# Patient Record
Sex: Male | Born: 1955 | Race: White | Hispanic: No | Marital: Married | Smoking: Never smoker
Health system: Southern US, Community
[De-identification: ages and names within clinical notes are randomized; demographics above are authoritative.]

## PROBLEM LIST (undated history)

## (undated) DIAGNOSIS — G8929 Other chronic pain: Secondary | ICD-10-CM

## (undated) DIAGNOSIS — M549 Dorsalgia, unspecified: Secondary | ICD-10-CM

## (undated) DIAGNOSIS — M542 Cervicalgia: Secondary | ICD-10-CM

## (undated) DIAGNOSIS — M47817 Spondylosis without myelopathy or radiculopathy, lumbosacral region: Secondary | ICD-10-CM

## (undated) DIAGNOSIS — R7302 Impaired glucose tolerance (oral): Secondary | ICD-10-CM

## (undated) DIAGNOSIS — F411 Generalized anxiety disorder: Secondary | ICD-10-CM

## (undated) DIAGNOSIS — K279 Peptic ulcer, site unspecified, unspecified as acute or chronic, without hemorrhage or perforation: Secondary | ICD-10-CM

## (undated) DIAGNOSIS — IMO0001 Reserved for inherently not codable concepts without codable children: Secondary | ICD-10-CM

## (undated) DIAGNOSIS — D530 Protein deficiency anemia: Secondary | ICD-10-CM

## (undated) DIAGNOSIS — D689 Coagulation defect, unspecified: Secondary | ICD-10-CM

## (undated) DIAGNOSIS — I10 Essential (primary) hypertension: Secondary | ICD-10-CM

## (undated) DIAGNOSIS — G4733 Obstructive sleep apnea (adult) (pediatric): Secondary | ICD-10-CM

## (undated) DIAGNOSIS — F329 Major depressive disorder, single episode, unspecified: Secondary | ICD-10-CM

## (undated) DIAGNOSIS — R569 Unspecified convulsions: Secondary | ICD-10-CM

## (undated) DIAGNOSIS — M67919 Unspecified disorder of synovium and tendon, unspecified shoulder: Secondary | ICD-10-CM

## (undated) DIAGNOSIS — Z86718 Personal history of other venous thrombosis and embolism: Secondary | ICD-10-CM

## (undated) DIAGNOSIS — J209 Acute bronchitis, unspecified: Secondary | ICD-10-CM

## (undated) DIAGNOSIS — R21 Rash and other nonspecific skin eruption: Secondary | ICD-10-CM

## (undated) DIAGNOSIS — M5030 Other cervical disc degeneration, unspecified cervical region: Secondary | ICD-10-CM

## (undated) DIAGNOSIS — G4700 Insomnia, unspecified: Secondary | ICD-10-CM

## (undated) DIAGNOSIS — K219 Gastro-esophageal reflux disease without esophagitis: Secondary | ICD-10-CM

## (undated) DIAGNOSIS — M810 Age-related osteoporosis without current pathological fracture: Secondary | ICD-10-CM

## (undated) DIAGNOSIS — M719 Bursopathy, unspecified: Secondary | ICD-10-CM

## (undated) HISTORY — PX: APPENDECTOMY: SHX54

## (undated) HISTORY — DX: Cervicalgia: M54.2

## (undated) HISTORY — DX: Generalized anxiety disorder: F41.1

## (undated) HISTORY — DX: Personal history of other venous thrombosis and embolism: Z86.718

## (undated) HISTORY — DX: Unspecified convulsions: R56.9

## (undated) HISTORY — DX: Reserved for inherently not codable concepts without codable children: IMO0001

## (undated) HISTORY — DX: Insomnia, unspecified: G47.00

## (undated) HISTORY — DX: Peptic ulcer, site unspecified, unspecified as acute or chronic, without hemorrhage or perforation: K27.9

## (undated) HISTORY — DX: Unspecified disorder of synovium and tendon, unspecified shoulder: M67.919

## (undated) HISTORY — DX: Essential (primary) hypertension: I10

## (undated) HISTORY — DX: Rash and other nonspecific skin eruption: R21

## (undated) HISTORY — DX: Bursopathy, unspecified: M71.9

## (undated) HISTORY — DX: Other chronic pain: G89.29

## (undated) HISTORY — DX: Acute bronchitis, unspecified: J20.9

## (undated) HISTORY — DX: Other cervical disc degeneration, unspecified cervical region: M50.30

## (undated) HISTORY — DX: Gastro-esophageal reflux disease without esophagitis: K21.9

## (undated) HISTORY — DX: Spondylosis without myelopathy or radiculopathy, lumbosacral region: M47.817

## (undated) HISTORY — DX: Major depressive disorder, single episode, unspecified: F32.9

## (undated) HISTORY — DX: Coagulation defect, unspecified: D68.9

## (undated) HISTORY — DX: Dorsalgia, unspecified: M54.9

## (undated) HISTORY — DX: Obstructive sleep apnea (adult) (pediatric): G47.33

## (undated) HISTORY — DX: Protein deficiency anemia: D53.0

## (undated) HISTORY — PX: OTHER SURGICAL HISTORY: SHX169

## (undated) HISTORY — DX: Impaired glucose tolerance (oral): R73.02

---

## 2000-12-26 ENCOUNTER — Emergency Department (HOSPITAL_COMMUNITY): Admission: EM | Admit: 2000-12-26 | Discharge: 2000-12-26 | Payer: Self-pay | Admitting: Emergency Medicine

## 2000-12-26 ENCOUNTER — Encounter: Payer: Self-pay | Admitting: Emergency Medicine

## 2000-12-30 ENCOUNTER — Emergency Department (HOSPITAL_COMMUNITY): Admission: EM | Admit: 2000-12-30 | Discharge: 2000-12-30 | Payer: Self-pay

## 2001-01-07 ENCOUNTER — Ambulatory Visit (HOSPITAL_COMMUNITY): Admission: RE | Admit: 2001-01-07 | Discharge: 2001-01-07 | Payer: Self-pay | Admitting: Cardiology

## 2001-03-11 ENCOUNTER — Encounter: Admission: RE | Admit: 2001-03-11 | Discharge: 2001-03-11 | Payer: Self-pay | Admitting: Family Medicine

## 2001-03-11 ENCOUNTER — Encounter: Payer: Self-pay | Admitting: Family Medicine

## 2001-07-24 ENCOUNTER — Encounter: Payer: Self-pay | Admitting: Orthopedic Surgery

## 2001-07-24 ENCOUNTER — Encounter: Admission: RE | Admit: 2001-07-24 | Discharge: 2001-07-24 | Payer: Self-pay | Admitting: Orthopedic Surgery

## 2002-05-20 ENCOUNTER — Ambulatory Visit (HOSPITAL_BASED_OUTPATIENT_CLINIC_OR_DEPARTMENT_OTHER): Admission: RE | Admit: 2002-05-20 | Discharge: 2002-05-20 | Payer: Self-pay | Admitting: Chiropractic Medicine

## 2002-06-16 ENCOUNTER — Ambulatory Visit (HOSPITAL_BASED_OUTPATIENT_CLINIC_OR_DEPARTMENT_OTHER): Admission: RE | Admit: 2002-06-16 | Discharge: 2002-06-16 | Payer: Self-pay | Admitting: Family Medicine

## 2003-02-12 ENCOUNTER — Emergency Department (HOSPITAL_COMMUNITY): Admission: EM | Admit: 2003-02-12 | Discharge: 2003-02-12 | Payer: Self-pay | Admitting: Emergency Medicine

## 2003-03-17 ENCOUNTER — Encounter: Admission: RE | Admit: 2003-03-17 | Discharge: 2003-03-17 | Payer: Self-pay | Admitting: Allergy and Immunology

## 2003-03-23 ENCOUNTER — Ambulatory Visit (HOSPITAL_COMMUNITY): Admission: RE | Admit: 2003-03-23 | Discharge: 2003-03-23 | Payer: Self-pay | Admitting: Allergy and Immunology

## 2003-07-06 ENCOUNTER — Encounter: Admission: RE | Admit: 2003-07-06 | Discharge: 2003-07-06 | Payer: Self-pay | Admitting: Internal Medicine

## 2004-03-25 HISTORY — PX: FACIAL RECONSTRUCTION SURGERY: SHX631

## 2004-04-09 ENCOUNTER — Ambulatory Visit: Payer: Self-pay | Admitting: Internal Medicine

## 2004-04-27 ENCOUNTER — Emergency Department (HOSPITAL_COMMUNITY): Admission: EM | Admit: 2004-04-27 | Discharge: 2004-04-27 | Payer: Self-pay | Admitting: Emergency Medicine

## 2004-05-10 ENCOUNTER — Ambulatory Visit: Payer: Self-pay | Admitting: Internal Medicine

## 2004-06-15 ENCOUNTER — Ambulatory Visit: Payer: Self-pay | Admitting: Internal Medicine

## 2004-09-12 ENCOUNTER — Emergency Department (HOSPITAL_COMMUNITY): Admission: EM | Admit: 2004-09-12 | Discharge: 2004-09-12 | Payer: Self-pay | Admitting: Emergency Medicine

## 2004-09-22 ENCOUNTER — Inpatient Hospital Stay (HOSPITAL_COMMUNITY): Admission: EM | Admit: 2004-09-22 | Discharge: 2004-10-10 | Payer: Self-pay | Admitting: Emergency Medicine

## 2004-10-12 ENCOUNTER — Ambulatory Visit: Payer: Self-pay | Admitting: Endocrinology

## 2004-10-16 ENCOUNTER — Ambulatory Visit: Payer: Self-pay | Admitting: Cardiology

## 2004-10-17 ENCOUNTER — Emergency Department (HOSPITAL_COMMUNITY): Admission: EM | Admit: 2004-10-17 | Discharge: 2004-10-17 | Payer: Self-pay | Admitting: Emergency Medicine

## 2004-10-19 ENCOUNTER — Ambulatory Visit: Payer: Self-pay | Admitting: Cardiology

## 2004-10-25 ENCOUNTER — Ambulatory Visit: Payer: Self-pay | Admitting: Internal Medicine

## 2004-10-31 ENCOUNTER — Ambulatory Visit: Payer: Self-pay | Admitting: Cardiology

## 2004-11-06 ENCOUNTER — Ambulatory Visit: Payer: Self-pay | Admitting: Internal Medicine

## 2004-11-12 ENCOUNTER — Ambulatory Visit: Payer: Self-pay | Admitting: Cardiology

## 2004-11-19 ENCOUNTER — Ambulatory Visit: Payer: Self-pay | Admitting: Internal Medicine

## 2004-11-21 ENCOUNTER — Ambulatory Visit: Payer: Self-pay | Admitting: *Deleted

## 2004-11-30 ENCOUNTER — Ambulatory Visit: Payer: Self-pay | Admitting: Cardiology

## 2004-12-12 ENCOUNTER — Ambulatory Visit: Payer: Self-pay | Admitting: Cardiology

## 2004-12-19 ENCOUNTER — Encounter: Admission: RE | Admit: 2004-12-19 | Discharge: 2004-12-19 | Payer: Self-pay | Admitting: Neurology

## 2004-12-25 ENCOUNTER — Inpatient Hospital Stay (HOSPITAL_COMMUNITY): Admission: EM | Admit: 2004-12-25 | Discharge: 2005-01-01 | Payer: Self-pay | Admitting: Emergency Medicine

## 2004-12-29 ENCOUNTER — Ambulatory Visit: Payer: Self-pay | Admitting: Internal Medicine

## 2005-01-08 ENCOUNTER — Ambulatory Visit: Payer: Self-pay | Admitting: Cardiology

## 2005-01-18 ENCOUNTER — Ambulatory Visit: Payer: Self-pay | Admitting: Internal Medicine

## 2005-01-18 ENCOUNTER — Ambulatory Visit: Payer: Self-pay | Admitting: Cardiology

## 2005-02-04 ENCOUNTER — Inpatient Hospital Stay (HOSPITAL_COMMUNITY): Admission: EM | Admit: 2005-02-04 | Discharge: 2005-02-06 | Payer: Self-pay | Admitting: Emergency Medicine

## 2005-02-04 ENCOUNTER — Ambulatory Visit: Payer: Self-pay | Admitting: Cardiology

## 2005-02-04 ENCOUNTER — Ambulatory Visit: Payer: Self-pay | Admitting: Internal Medicine

## 2005-02-12 ENCOUNTER — Ambulatory Visit: Payer: Self-pay | Admitting: Cardiology

## 2005-02-26 ENCOUNTER — Ambulatory Visit: Payer: Self-pay | Admitting: Internal Medicine

## 2005-02-27 ENCOUNTER — Ambulatory Visit (HOSPITAL_COMMUNITY): Admission: RE | Admit: 2005-02-27 | Discharge: 2005-02-27 | Payer: Self-pay | Admitting: Internal Medicine

## 2005-03-03 ENCOUNTER — Emergency Department (HOSPITAL_COMMUNITY): Admission: EM | Admit: 2005-03-03 | Discharge: 2005-03-03 | Payer: Self-pay | Admitting: Emergency Medicine

## 2005-03-08 ENCOUNTER — Ambulatory Visit: Payer: Self-pay | Admitting: Cardiology

## 2005-04-30 ENCOUNTER — Emergency Department (HOSPITAL_COMMUNITY): Admission: EM | Admit: 2005-04-30 | Discharge: 2005-04-30 | Payer: Self-pay | Admitting: Emergency Medicine

## 2005-08-01 ENCOUNTER — Emergency Department (HOSPITAL_COMMUNITY): Admission: EM | Admit: 2005-08-01 | Discharge: 2005-08-01 | Payer: Self-pay | Admitting: Emergency Medicine

## 2005-08-15 ENCOUNTER — Ambulatory Visit: Payer: Self-pay | Admitting: Internal Medicine

## 2005-08-22 ENCOUNTER — Emergency Department (HOSPITAL_COMMUNITY): Admission: EM | Admit: 2005-08-22 | Discharge: 2005-08-23 | Payer: Self-pay | Admitting: *Deleted

## 2005-09-24 ENCOUNTER — Inpatient Hospital Stay (HOSPITAL_COMMUNITY): Admission: EM | Admit: 2005-09-24 | Discharge: 2005-10-03 | Payer: Self-pay | Admitting: Emergency Medicine

## 2005-09-24 ENCOUNTER — Ambulatory Visit: Payer: Self-pay | Admitting: Internal Medicine

## 2005-09-24 ENCOUNTER — Encounter: Payer: Self-pay | Admitting: Vascular Surgery

## 2005-09-27 ENCOUNTER — Encounter (INDEPENDENT_AMBULATORY_CARE_PROVIDER_SITE_OTHER): Payer: Self-pay | Admitting: *Deleted

## 2005-09-29 ENCOUNTER — Ambulatory Visit: Payer: Self-pay | Admitting: Infectious Diseases

## 2005-10-01 ENCOUNTER — Encounter: Payer: Self-pay | Admitting: Vascular Surgery

## 2005-10-02 ENCOUNTER — Ambulatory Visit: Payer: Self-pay | Admitting: Oncology

## 2005-10-08 ENCOUNTER — Ambulatory Visit: Payer: Self-pay | Admitting: Internal Medicine

## 2005-10-09 ENCOUNTER — Ambulatory Visit: Payer: Self-pay | Admitting: Oncology

## 2005-10-18 ENCOUNTER — Ambulatory Visit: Payer: Self-pay | Admitting: Cardiology

## 2005-10-25 ENCOUNTER — Ambulatory Visit: Payer: Self-pay | Admitting: Internal Medicine

## 2005-11-01 ENCOUNTER — Ambulatory Visit: Payer: Self-pay | Admitting: Cardiology

## 2005-11-01 ENCOUNTER — Ambulatory Visit: Payer: Self-pay | Admitting: Internal Medicine

## 2005-11-22 ENCOUNTER — Ambulatory Visit: Payer: Self-pay

## 2005-11-22 ENCOUNTER — Ambulatory Visit: Payer: Self-pay | Admitting: Cardiology

## 2005-11-29 ENCOUNTER — Ambulatory Visit: Payer: Self-pay | Admitting: Cardiology

## 2005-12-12 ENCOUNTER — Ambulatory Visit: Payer: Self-pay | Admitting: Cardiology

## 2005-12-12 ENCOUNTER — Ambulatory Visit: Payer: Self-pay | Admitting: Internal Medicine

## 2005-12-13 ENCOUNTER — Inpatient Hospital Stay (HOSPITAL_COMMUNITY): Admission: EM | Admit: 2005-12-13 | Discharge: 2005-12-14 | Payer: Self-pay | Admitting: Emergency Medicine

## 2005-12-13 ENCOUNTER — Encounter: Payer: Self-pay | Admitting: Vascular Surgery

## 2005-12-26 ENCOUNTER — Ambulatory Visit: Payer: Self-pay | Admitting: Internal Medicine

## 2005-12-26 ENCOUNTER — Ambulatory Visit: Payer: Self-pay | Admitting: Cardiology

## 2005-12-28 ENCOUNTER — Emergency Department (HOSPITAL_COMMUNITY): Admission: EM | Admit: 2005-12-28 | Discharge: 2005-12-29 | Payer: Self-pay | Admitting: Emergency Medicine

## 2006-01-03 ENCOUNTER — Ambulatory Visit: Payer: Self-pay | Admitting: Internal Medicine

## 2006-01-06 ENCOUNTER — Ambulatory Visit: Payer: Self-pay | Admitting: Cardiology

## 2006-01-13 ENCOUNTER — Ambulatory Visit: Payer: Self-pay | Admitting: Cardiovascular Disease

## 2006-01-23 ENCOUNTER — Ambulatory Visit: Payer: Self-pay | Admitting: *Deleted

## 2006-02-18 ENCOUNTER — Ambulatory Visit: Payer: Self-pay | Admitting: Cardiology

## 2006-03-04 ENCOUNTER — Ambulatory Visit: Payer: Self-pay | Admitting: Cardiovascular Disease

## 2006-03-10 ENCOUNTER — Ambulatory Visit: Payer: Self-pay | Admitting: Cardiology

## 2006-03-26 ENCOUNTER — Ambulatory Visit (HOSPITAL_COMMUNITY): Admission: RE | Admit: 2006-03-26 | Discharge: 2006-03-26 | Payer: Self-pay | Admitting: Internal Medicine

## 2006-03-26 ENCOUNTER — Ambulatory Visit: Payer: Self-pay | Admitting: Internal Medicine

## 2006-03-26 LAB — CONVERTED CEMR LAB
Ketones, ur: NEGATIVE mg/dL
Nitrite: NEGATIVE
Urine Glucose: NEGATIVE mg/dL
pH: 6 (ref 5.0–8.0)

## 2006-03-31 ENCOUNTER — Emergency Department (HOSPITAL_COMMUNITY): Admission: EM | Admit: 2006-03-31 | Discharge: 2006-03-31 | Payer: Self-pay | Admitting: Emergency Medicine

## 2006-04-08 ENCOUNTER — Ambulatory Visit: Payer: Self-pay | Admitting: Cardiology

## 2006-04-18 ENCOUNTER — Ambulatory Visit: Payer: Self-pay | Admitting: Internal Medicine

## 2006-05-01 ENCOUNTER — Ambulatory Visit: Payer: Self-pay | Admitting: Cardiology

## 2006-05-15 ENCOUNTER — Ambulatory Visit: Payer: Self-pay | Admitting: Oncology

## 2006-05-17 ENCOUNTER — Emergency Department (HOSPITAL_COMMUNITY): Admission: EM | Admit: 2006-05-17 | Discharge: 2006-05-17 | Payer: Self-pay | Admitting: Emergency Medicine

## 2006-05-22 ENCOUNTER — Emergency Department (HOSPITAL_COMMUNITY): Admission: EM | Admit: 2006-05-22 | Discharge: 2006-05-22 | Payer: Self-pay | Admitting: Emergency Medicine

## 2006-05-23 ENCOUNTER — Ambulatory Visit: Payer: Self-pay | Admitting: Internal Medicine
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
## 2006-05-26 ENCOUNTER — Ambulatory Visit: Payer: Self-pay | Admitting: Cardiovascular Disease

## 2006-05-29 ENCOUNTER — Ambulatory Visit (HOSPITAL_COMMUNITY): Admission: RE | Admit: 2006-05-29 | Discharge: 2006-05-29 | Payer: Self-pay | Admitting: Gastroenterology

## 2006-06-02 ENCOUNTER — Ambulatory Visit: Payer: Self-pay | Admitting: Cardiology

## 2006-06-06 ENCOUNTER — Ambulatory Visit: Payer: Self-pay | Admitting: Internal Medicine

## 2006-06-06 LAB — CONVERTED CEMR LAB
Albumin: 3.7 g/dL (ref 3.5–5.2)
Amylase: 63 units/L (ref 27–131)
Basophils Absolute: 0 10*3/uL (ref 0.0–0.1)
Bilirubin Urine: NEGATIVE
Creatinine, Ser: 1.2 mg/dL (ref 0.4–1.5)
Eosinophils Absolute: 0.4 10*3/uL (ref 0.0–0.6)
Hemoglobin: 14.4 g/dL (ref 13.0–17.0)
Leukocytes, UA: NEGATIVE
Lipase: 26 units/L (ref 11.0–59.0)
Lymphocytes Relative: 30.5 % (ref 12.0–46.0)
MCHC: 34.1 g/dL (ref 30.0–36.0)
Monocytes Absolute: 0.4 10*3/uL (ref 0.2–0.7)
Monocytes Relative: 8.4 % (ref 3.0–11.0)
Neutro Abs: 2.9 10*3/uL (ref 1.4–7.7)
Nitrite: NEGATIVE
Potassium: 4.2 meq/L (ref 3.5–5.1)
Sodium: 139 meq/L (ref 135–145)
Specific Gravity, Urine: 1.02 (ref 1.000–1.03)
Total Bilirubin: 0.5 mg/dL (ref 0.3–1.2)
Total Protein: 7.4 g/dL (ref 6.0–8.3)
Urine Glucose: NEGATIVE mg/dL
Urobilinogen, UA: 0.2 (ref 0.0–1.0)

## 2006-06-09 ENCOUNTER — Encounter: Payer: Self-pay | Admitting: Internal Medicine

## 2006-06-16 ENCOUNTER — Ambulatory Visit: Payer: Self-pay | Admitting: Cardiology

## 2006-06-17 ENCOUNTER — Ambulatory Visit: Payer: Self-pay | Admitting: Internal Medicine

## 2006-06-23 ENCOUNTER — Ambulatory Visit: Payer: Self-pay | Admitting: Internal Medicine

## 2006-07-03 ENCOUNTER — Ambulatory Visit: Payer: Self-pay | Admitting: Cardiology

## 2006-07-11 ENCOUNTER — Ambulatory Visit (HOSPITAL_COMMUNITY): Admission: RE | Admit: 2006-07-11 | Discharge: 2006-07-11 | Payer: Self-pay | Admitting: Pediatrics

## 2006-07-16 ENCOUNTER — Inpatient Hospital Stay (HOSPITAL_COMMUNITY): Admission: EM | Admit: 2006-07-16 | Discharge: 2006-07-18 | Payer: Self-pay | Admitting: Emergency Medicine

## 2006-07-16 ENCOUNTER — Ambulatory Visit: Payer: Self-pay | Admitting: Internal Medicine

## 2006-07-21 ENCOUNTER — Inpatient Hospital Stay (HOSPITAL_COMMUNITY): Admission: AD | Admit: 2006-07-21 | Discharge: 2006-07-25 | Payer: Self-pay | Admitting: Neurology

## 2006-07-28 ENCOUNTER — Ambulatory Visit: Payer: Self-pay | Admitting: Internal Medicine

## 2006-07-28 ENCOUNTER — Ambulatory Visit: Payer: Self-pay | Admitting: Cardiovascular Disease

## 2006-07-28 LAB — CONVERTED CEMR LAB
Bilirubin Urine: NEGATIVE
CO2: 26 meq/L (ref 19–32)
Glucose, Bld: 117 mg/dL — ABNORMAL HIGH (ref 70–99)
Hemoglobin, Urine: NEGATIVE
Ketones, ur: NEGATIVE mg/dL
Leukocytes, UA: NEGATIVE
Nitrite: NEGATIVE
Phosphorus: 2.1 mg/dL — ABNORMAL LOW (ref 2.3–4.6)
Potassium: 3.8 meq/L (ref 3.5–5.1)
Total Protein, Urine: NEGATIVE mg/dL
Urine Glucose: NEGATIVE mg/dL
Urobilinogen, UA: 0.2 (ref 0.0–1.0)
pH: 6 (ref 5.0–8.0)

## 2006-07-31 ENCOUNTER — Inpatient Hospital Stay (HOSPITAL_COMMUNITY): Admission: EM | Admit: 2006-07-31 | Discharge: 2006-08-02 | Payer: Self-pay | Admitting: Emergency Medicine

## 2006-08-01 ENCOUNTER — Ambulatory Visit: Payer: Self-pay | Admitting: Vascular Surgery

## 2006-08-11 ENCOUNTER — Ambulatory Visit: Payer: Self-pay | Admitting: Internal Medicine

## 2006-08-13 ENCOUNTER — Ambulatory Visit: Payer: Self-pay | Admitting: Internal Medicine

## 2006-08-25 ENCOUNTER — Ambulatory Visit: Payer: Self-pay | Admitting: Cardiovascular Disease

## 2006-09-04 ENCOUNTER — Ambulatory Visit: Payer: Self-pay | Admitting: Internal Medicine

## 2006-09-04 ENCOUNTER — Ambulatory Visit: Payer: Self-pay | Admitting: Cardiology

## 2006-09-09 ENCOUNTER — Ambulatory Visit: Payer: Self-pay | Admitting: Cardiology

## 2006-09-22 ENCOUNTER — Ambulatory Visit: Payer: Self-pay | Admitting: Cardiology

## 2006-09-29 ENCOUNTER — Inpatient Hospital Stay (HOSPITAL_COMMUNITY): Admission: EM | Admit: 2006-09-29 | Discharge: 2006-10-07 | Payer: Self-pay | Admitting: Emergency Medicine

## 2006-09-29 DIAGNOSIS — D530 Protein deficiency anemia: Secondary | ICD-10-CM | POA: Insufficient documentation

## 2006-09-29 DIAGNOSIS — G4733 Obstructive sleep apnea (adult) (pediatric): Secondary | ICD-10-CM | POA: Insufficient documentation

## 2006-09-29 DIAGNOSIS — F411 Generalized anxiety disorder: Secondary | ICD-10-CM

## 2006-09-29 DIAGNOSIS — G8929 Other chronic pain: Secondary | ICD-10-CM

## 2006-09-29 DIAGNOSIS — K219 Gastro-esophageal reflux disease without esophagitis: Secondary | ICD-10-CM

## 2006-09-29 DIAGNOSIS — F329 Major depressive disorder, single episode, unspecified: Secondary | ICD-10-CM

## 2006-09-29 DIAGNOSIS — D689 Coagulation defect, unspecified: Secondary | ICD-10-CM

## 2006-09-29 DIAGNOSIS — F3289 Other specified depressive episodes: Secondary | ICD-10-CM

## 2006-09-29 DIAGNOSIS — G40909 Epilepsy, unspecified, not intractable, without status epilepticus: Secondary | ICD-10-CM | POA: Insufficient documentation

## 2006-09-29 DIAGNOSIS — R569 Unspecified convulsions: Secondary | ICD-10-CM

## 2006-09-29 DIAGNOSIS — I10 Essential (primary) hypertension: Secondary | ICD-10-CM

## 2006-09-29 HISTORY — DX: Other chronic pain: G89.29

## 2006-09-29 HISTORY — DX: Obstructive sleep apnea (adult) (pediatric): G47.33

## 2006-09-29 HISTORY — DX: Essential (primary) hypertension: I10

## 2006-09-29 HISTORY — DX: Protein deficiency anemia: D53.0

## 2006-09-29 HISTORY — DX: Other specified depressive episodes: F32.89

## 2006-09-29 HISTORY — DX: Gastro-esophageal reflux disease without esophagitis: K21.9

## 2006-09-29 HISTORY — DX: Coagulation defect, unspecified: D68.9

## 2006-09-29 HISTORY — DX: Major depressive disorder, single episode, unspecified: F32.9

## 2006-09-29 HISTORY — DX: Unspecified convulsions: R56.9

## 2006-09-29 HISTORY — DX: Generalized anxiety disorder: F41.1

## 2006-09-30 ENCOUNTER — Ambulatory Visit: Payer: Self-pay | Admitting: Internal Medicine

## 2006-10-03 ENCOUNTER — Encounter: Payer: Self-pay | Admitting: Internal Medicine

## 2006-10-07 ENCOUNTER — Ambulatory Visit: Payer: Self-pay | Admitting: Internal Medicine

## 2006-10-16 ENCOUNTER — Ambulatory Visit: Payer: Self-pay | Admitting: Cardiology

## 2006-10-18 ENCOUNTER — Emergency Department (HOSPITAL_COMMUNITY): Admission: EM | Admit: 2006-10-18 | Discharge: 2006-10-18 | Payer: Self-pay | Admitting: Emergency Medicine

## 2006-10-24 ENCOUNTER — Ambulatory Visit: Payer: Self-pay | Admitting: Internal Medicine

## 2006-11-03 ENCOUNTER — Inpatient Hospital Stay (HOSPITAL_COMMUNITY): Admission: EM | Admit: 2006-11-03 | Discharge: 2006-11-07 | Payer: Self-pay | Admitting: Emergency Medicine

## 2006-11-03 ENCOUNTER — Ambulatory Visit: Payer: Self-pay | Admitting: Internal Medicine

## 2006-11-06 ENCOUNTER — Ambulatory Visit: Payer: Self-pay | Admitting: Physical Medicine & Rehabilitation

## 2006-11-13 ENCOUNTER — Ambulatory Visit: Payer: Self-pay | Admitting: Internal Medicine

## 2006-11-14 ENCOUNTER — Ambulatory Visit: Payer: Self-pay | Admitting: Cardiovascular Disease

## 2006-11-25 ENCOUNTER — Ambulatory Visit: Payer: Self-pay | Admitting: Cardiology

## 2006-12-03 ENCOUNTER — Ambulatory Visit: Payer: Self-pay | Admitting: Internal Medicine

## 2006-12-04 ENCOUNTER — Ambulatory Visit: Payer: Self-pay | Admitting: Cardiology

## 2006-12-18 ENCOUNTER — Ambulatory Visit: Payer: Self-pay | Admitting: Cardiology

## 2006-12-30 ENCOUNTER — Encounter
Admission: RE | Admit: 2006-12-30 | Discharge: 2007-03-30 | Payer: Self-pay | Admitting: Physical Medicine & Rehabilitation

## 2006-12-30 ENCOUNTER — Ambulatory Visit: Payer: Self-pay | Admitting: Cardiovascular Disease

## 2006-12-30 ENCOUNTER — Ambulatory Visit: Payer: Self-pay | Admitting: Physical Medicine & Rehabilitation

## 2006-12-30 LAB — CONVERTED CEMR LAB: Prothrombin Time: 27.1 s — ABNORMAL HIGH (ref 10.9–13.3)

## 2007-01-03 ENCOUNTER — Inpatient Hospital Stay (HOSPITAL_COMMUNITY): Admission: EM | Admit: 2007-01-03 | Discharge: 2007-01-05 | Payer: Self-pay | Admitting: Emergency Medicine

## 2007-01-03 ENCOUNTER — Ambulatory Visit: Payer: Self-pay | Admitting: Internal Medicine

## 2007-01-09 ENCOUNTER — Ambulatory Visit: Payer: Self-pay | Admitting: Cardiology

## 2007-01-14 ENCOUNTER — Ambulatory Visit: Payer: Self-pay | Admitting: Cardiology

## 2007-01-14 ENCOUNTER — Ambulatory Visit: Payer: Self-pay | Admitting: Internal Medicine

## 2007-01-17 ENCOUNTER — Emergency Department (HOSPITAL_COMMUNITY): Admission: EM | Admit: 2007-01-17 | Discharge: 2007-01-17 | Payer: Self-pay | Admitting: Emergency Medicine

## 2007-01-22 ENCOUNTER — Observation Stay (HOSPITAL_COMMUNITY): Admission: EM | Admit: 2007-01-22 | Discharge: 2007-01-26 | Payer: Self-pay | Admitting: Emergency Medicine

## 2007-01-23 ENCOUNTER — Ambulatory Visit: Payer: Self-pay | Admitting: Internal Medicine

## 2007-01-28 ENCOUNTER — Ambulatory Visit: Payer: Self-pay | Admitting: Cardiology

## 2007-01-29 ENCOUNTER — Telehealth (INDEPENDENT_AMBULATORY_CARE_PROVIDER_SITE_OTHER): Payer: Self-pay | Admitting: *Deleted

## 2007-02-01 ENCOUNTER — Encounter: Payer: Self-pay | Admitting: Internal Medicine

## 2007-02-01 DIAGNOSIS — K279 Peptic ulcer, site unspecified, unspecified as acute or chronic, without hemorrhage or perforation: Secondary | ICD-10-CM

## 2007-02-01 DIAGNOSIS — M719 Bursopathy, unspecified: Secondary | ICD-10-CM

## 2007-02-01 DIAGNOSIS — Z86718 Personal history of other venous thrombosis and embolism: Secondary | ICD-10-CM

## 2007-02-01 DIAGNOSIS — M47817 Spondylosis without myelopathy or radiculopathy, lumbosacral region: Secondary | ICD-10-CM

## 2007-02-01 DIAGNOSIS — M67919 Unspecified disorder of synovium and tendon, unspecified shoulder: Secondary | ICD-10-CM | POA: Insufficient documentation

## 2007-02-01 DIAGNOSIS — IMO0001 Reserved for inherently not codable concepts without codable children: Secondary | ICD-10-CM

## 2007-02-01 DIAGNOSIS — M5030 Other cervical disc degeneration, unspecified cervical region: Secondary | ICD-10-CM

## 2007-02-01 HISTORY — DX: Unspecified disorder of synovium and tendon, unspecified shoulder: M67.919

## 2007-02-01 HISTORY — DX: Spondylosis without myelopathy or radiculopathy, lumbosacral region: M47.817

## 2007-02-01 HISTORY — DX: Reserved for inherently not codable concepts without codable children: IMO0001

## 2007-02-01 HISTORY — DX: Other cervical disc degeneration, unspecified cervical region: M50.30

## 2007-02-01 HISTORY — DX: Personal history of other venous thrombosis and embolism: Z86.718

## 2007-02-01 HISTORY — DX: Peptic ulcer, site unspecified, unspecified as acute or chronic, without hemorrhage or perforation: K27.9

## 2007-02-02 ENCOUNTER — Ambulatory Visit: Payer: Self-pay | Admitting: Physical Medicine & Rehabilitation

## 2007-02-04 ENCOUNTER — Ambulatory Visit: Payer: Self-pay | Admitting: Cardiology

## 2007-02-06 ENCOUNTER — Encounter: Payer: Self-pay | Admitting: Internal Medicine

## 2007-02-17 ENCOUNTER — Ambulatory Visit: Payer: Self-pay | Admitting: Internal Medicine

## 2007-02-19 ENCOUNTER — Ambulatory Visit: Payer: Self-pay | Admitting: Internal Medicine

## 2007-02-19 ENCOUNTER — Inpatient Hospital Stay (HOSPITAL_COMMUNITY): Admission: EM | Admit: 2007-02-19 | Discharge: 2007-02-24 | Payer: Self-pay | Admitting: Emergency Medicine

## 2007-02-28 ENCOUNTER — Encounter: Payer: Self-pay | Admitting: Internal Medicine

## 2007-03-01 ENCOUNTER — Emergency Department (HOSPITAL_COMMUNITY): Admission: EM | Admit: 2007-03-01 | Discharge: 2007-03-02 | Payer: Self-pay | Admitting: Emergency Medicine

## 2007-03-02 ENCOUNTER — Encounter: Payer: Self-pay | Admitting: Internal Medicine

## 2007-03-26 ENCOUNTER — Emergency Department (HOSPITAL_COMMUNITY): Admission: EM | Admit: 2007-03-26 | Discharge: 2007-03-26 | Payer: Self-pay | Admitting: Emergency Medicine

## 2007-03-30 ENCOUNTER — Ambulatory Visit: Payer: Self-pay | Admitting: Cardiology

## 2007-04-03 ENCOUNTER — Ambulatory Visit: Payer: Self-pay | Admitting: Internal Medicine

## 2007-04-03 ENCOUNTER — Inpatient Hospital Stay (HOSPITAL_COMMUNITY): Admission: EM | Admit: 2007-04-03 | Discharge: 2007-04-05 | Payer: Self-pay | Admitting: Emergency Medicine

## 2007-04-03 ENCOUNTER — Ambulatory Visit: Payer: Self-pay | Admitting: Nephrology

## 2007-04-16 ENCOUNTER — Ambulatory Visit: Payer: Self-pay | Admitting: Cardiology

## 2007-04-20 ENCOUNTER — Encounter: Payer: Self-pay | Admitting: Internal Medicine

## 2007-05-11 ENCOUNTER — Telehealth (INDEPENDENT_AMBULATORY_CARE_PROVIDER_SITE_OTHER): Payer: Self-pay | Admitting: *Deleted

## 2007-05-12 ENCOUNTER — Ambulatory Visit: Payer: Self-pay | Admitting: Internal Medicine

## 2007-05-12 DIAGNOSIS — R21 Rash and other nonspecific skin eruption: Secondary | ICD-10-CM

## 2007-05-12 DIAGNOSIS — M549 Dorsalgia, unspecified: Secondary | ICD-10-CM | POA: Insufficient documentation

## 2007-05-12 HISTORY — DX: Dorsalgia, unspecified: M54.9

## 2007-05-12 HISTORY — DX: Rash and other nonspecific skin eruption: R21

## 2007-05-14 ENCOUNTER — Ambulatory Visit: Payer: Self-pay | Admitting: Cardiology

## 2007-05-14 ENCOUNTER — Ambulatory Visit: Payer: Self-pay | Admitting: Internal Medicine

## 2007-05-14 ENCOUNTER — Observation Stay (HOSPITAL_COMMUNITY): Admission: EM | Admit: 2007-05-14 | Discharge: 2007-05-15 | Payer: Self-pay | Admitting: Emergency Medicine

## 2007-05-18 ENCOUNTER — Ambulatory Visit: Payer: Self-pay | Admitting: Cardiology

## 2007-05-22 ENCOUNTER — Ambulatory Visit: Payer: Self-pay | Admitting: Cardiology

## 2007-05-22 ENCOUNTER — Telehealth (INDEPENDENT_AMBULATORY_CARE_PROVIDER_SITE_OTHER): Payer: Self-pay | Admitting: *Deleted

## 2007-05-22 LAB — CONVERTED CEMR LAB
INR: 2.1 — ABNORMAL HIGH (ref 0.8–1.0)
Prothrombin Time: 17.9 s — ABNORMAL HIGH (ref 10.9–13.3)

## 2007-06-02 ENCOUNTER — Telehealth (INDEPENDENT_AMBULATORY_CARE_PROVIDER_SITE_OTHER): Payer: Self-pay | Admitting: *Deleted

## 2007-06-05 ENCOUNTER — Ambulatory Visit: Payer: Self-pay | Admitting: Internal Medicine

## 2007-06-06 ENCOUNTER — Emergency Department (HOSPITAL_COMMUNITY): Admission: EM | Admit: 2007-06-06 | Discharge: 2007-06-06 | Payer: Self-pay | Admitting: Emergency Medicine

## 2007-06-10 ENCOUNTER — Ambulatory Visit: Payer: Self-pay | Admitting: Cardiology

## 2007-06-11 ENCOUNTER — Inpatient Hospital Stay (HOSPITAL_COMMUNITY): Admission: EM | Admit: 2007-06-11 | Discharge: 2007-06-13 | Payer: Self-pay | Admitting: Emergency Medicine

## 2007-06-11 ENCOUNTER — Ambulatory Visit: Payer: Self-pay | Admitting: Internal Medicine

## 2007-06-19 ENCOUNTER — Encounter: Payer: Self-pay | Admitting: Internal Medicine

## 2007-06-19 ENCOUNTER — Ambulatory Visit: Payer: Self-pay | Admitting: Cardiology

## 2007-06-23 ENCOUNTER — Ambulatory Visit: Payer: Self-pay | Admitting: Internal Medicine

## 2007-06-23 DIAGNOSIS — R1084 Generalized abdominal pain: Secondary | ICD-10-CM | POA: Insufficient documentation

## 2007-06-26 ENCOUNTER — Inpatient Hospital Stay (HOSPITAL_COMMUNITY): Admission: EM | Admit: 2007-06-26 | Discharge: 2007-06-29 | Payer: Self-pay | Admitting: Emergency Medicine

## 2007-06-28 ENCOUNTER — Encounter (INDEPENDENT_AMBULATORY_CARE_PROVIDER_SITE_OTHER): Payer: Self-pay | Admitting: *Deleted

## 2007-06-29 ENCOUNTER — Encounter: Payer: Self-pay | Admitting: Internal Medicine

## 2007-07-02 ENCOUNTER — Encounter: Payer: Self-pay | Admitting: Internal Medicine

## 2007-07-02 LAB — HM COLONOSCOPY: HM Colonoscopy: NORMAL

## 2007-07-15 ENCOUNTER — Ambulatory Visit: Payer: Self-pay | Admitting: Cardiology

## 2007-07-29 ENCOUNTER — Ambulatory Visit: Payer: Self-pay | Admitting: Internal Medicine

## 2007-08-12 ENCOUNTER — Ambulatory Visit: Payer: Self-pay | Admitting: Cardiology

## 2007-08-28 ENCOUNTER — Ambulatory Visit: Payer: Self-pay | Admitting: Cardiovascular Disease

## 2007-09-08 ENCOUNTER — Ambulatory Visit: Payer: Self-pay | Admitting: Cardiology

## 2007-09-22 ENCOUNTER — Ambulatory Visit: Payer: Self-pay | Admitting: Cardiology

## 2007-10-12 ENCOUNTER — Telehealth (INDEPENDENT_AMBULATORY_CARE_PROVIDER_SITE_OTHER): Payer: Self-pay | Admitting: *Deleted

## 2007-10-20 ENCOUNTER — Ambulatory Visit: Payer: Self-pay | Admitting: Internal Medicine

## 2007-10-22 ENCOUNTER — Ambulatory Visit: Payer: Self-pay

## 2007-12-02 ENCOUNTER — Emergency Department (HOSPITAL_COMMUNITY): Admission: EM | Admit: 2007-12-02 | Discharge: 2007-12-03 | Payer: Self-pay | Admitting: Emergency Medicine

## 2007-12-28 ENCOUNTER — Encounter: Payer: Self-pay | Admitting: Internal Medicine

## 2007-12-30 ENCOUNTER — Telehealth: Payer: Self-pay | Admitting: Internal Medicine

## 2008-01-11 ENCOUNTER — Ambulatory Visit: Payer: Self-pay | Admitting: Internal Medicine

## 2008-01-11 ENCOUNTER — Telehealth: Payer: Self-pay | Admitting: Internal Medicine

## 2008-02-03 ENCOUNTER — Encounter: Payer: Self-pay | Admitting: Internal Medicine

## 2008-02-10 ENCOUNTER — Inpatient Hospital Stay (HOSPITAL_COMMUNITY): Admission: RE | Admit: 2008-02-10 | Discharge: 2008-02-12 | Payer: Self-pay | Admitting: Orthopedic Surgery

## 2008-07-24 ENCOUNTER — Emergency Department (HOSPITAL_COMMUNITY): Admission: EM | Admit: 2008-07-24 | Discharge: 2008-07-24 | Payer: Self-pay | Admitting: Emergency Medicine

## 2008-08-27 ENCOUNTER — Emergency Department (HOSPITAL_COMMUNITY): Admission: EM | Admit: 2008-08-27 | Discharge: 2008-08-27 | Payer: Self-pay | Admitting: Emergency Medicine

## 2008-08-29 ENCOUNTER — Telehealth: Payer: Self-pay | Admitting: Internal Medicine

## 2008-09-02 ENCOUNTER — Telehealth: Payer: Self-pay | Admitting: Internal Medicine

## 2008-09-06 ENCOUNTER — Telehealth: Payer: Self-pay | Admitting: Internal Medicine

## 2008-10-06 ENCOUNTER — Ambulatory Visit: Payer: Self-pay | Admitting: Internal Medicine

## 2008-10-06 DIAGNOSIS — R1319 Other dysphagia: Secondary | ICD-10-CM

## 2008-10-07 ENCOUNTER — Encounter (INDEPENDENT_AMBULATORY_CARE_PROVIDER_SITE_OTHER): Payer: Self-pay | Admitting: *Deleted

## 2008-10-10 ENCOUNTER — Encounter: Payer: Self-pay | Admitting: Internal Medicine

## 2008-11-11 ENCOUNTER — Telehealth (INDEPENDENT_AMBULATORY_CARE_PROVIDER_SITE_OTHER): Payer: Self-pay | Admitting: *Deleted

## 2008-12-14 ENCOUNTER — Encounter: Payer: Self-pay | Admitting: Internal Medicine

## 2009-02-09 ENCOUNTER — Ambulatory Visit: Payer: Self-pay | Admitting: Internal Medicine

## 2009-02-15 IMAGING — CR DG CHEST 2V
2 series · 2 of 2 positions shown · non-contrast
Comparison: 05/14/2007

CLINICAL DATA: Right-sided chest pain

CHEST - 2 VIEW

[w chest pa]
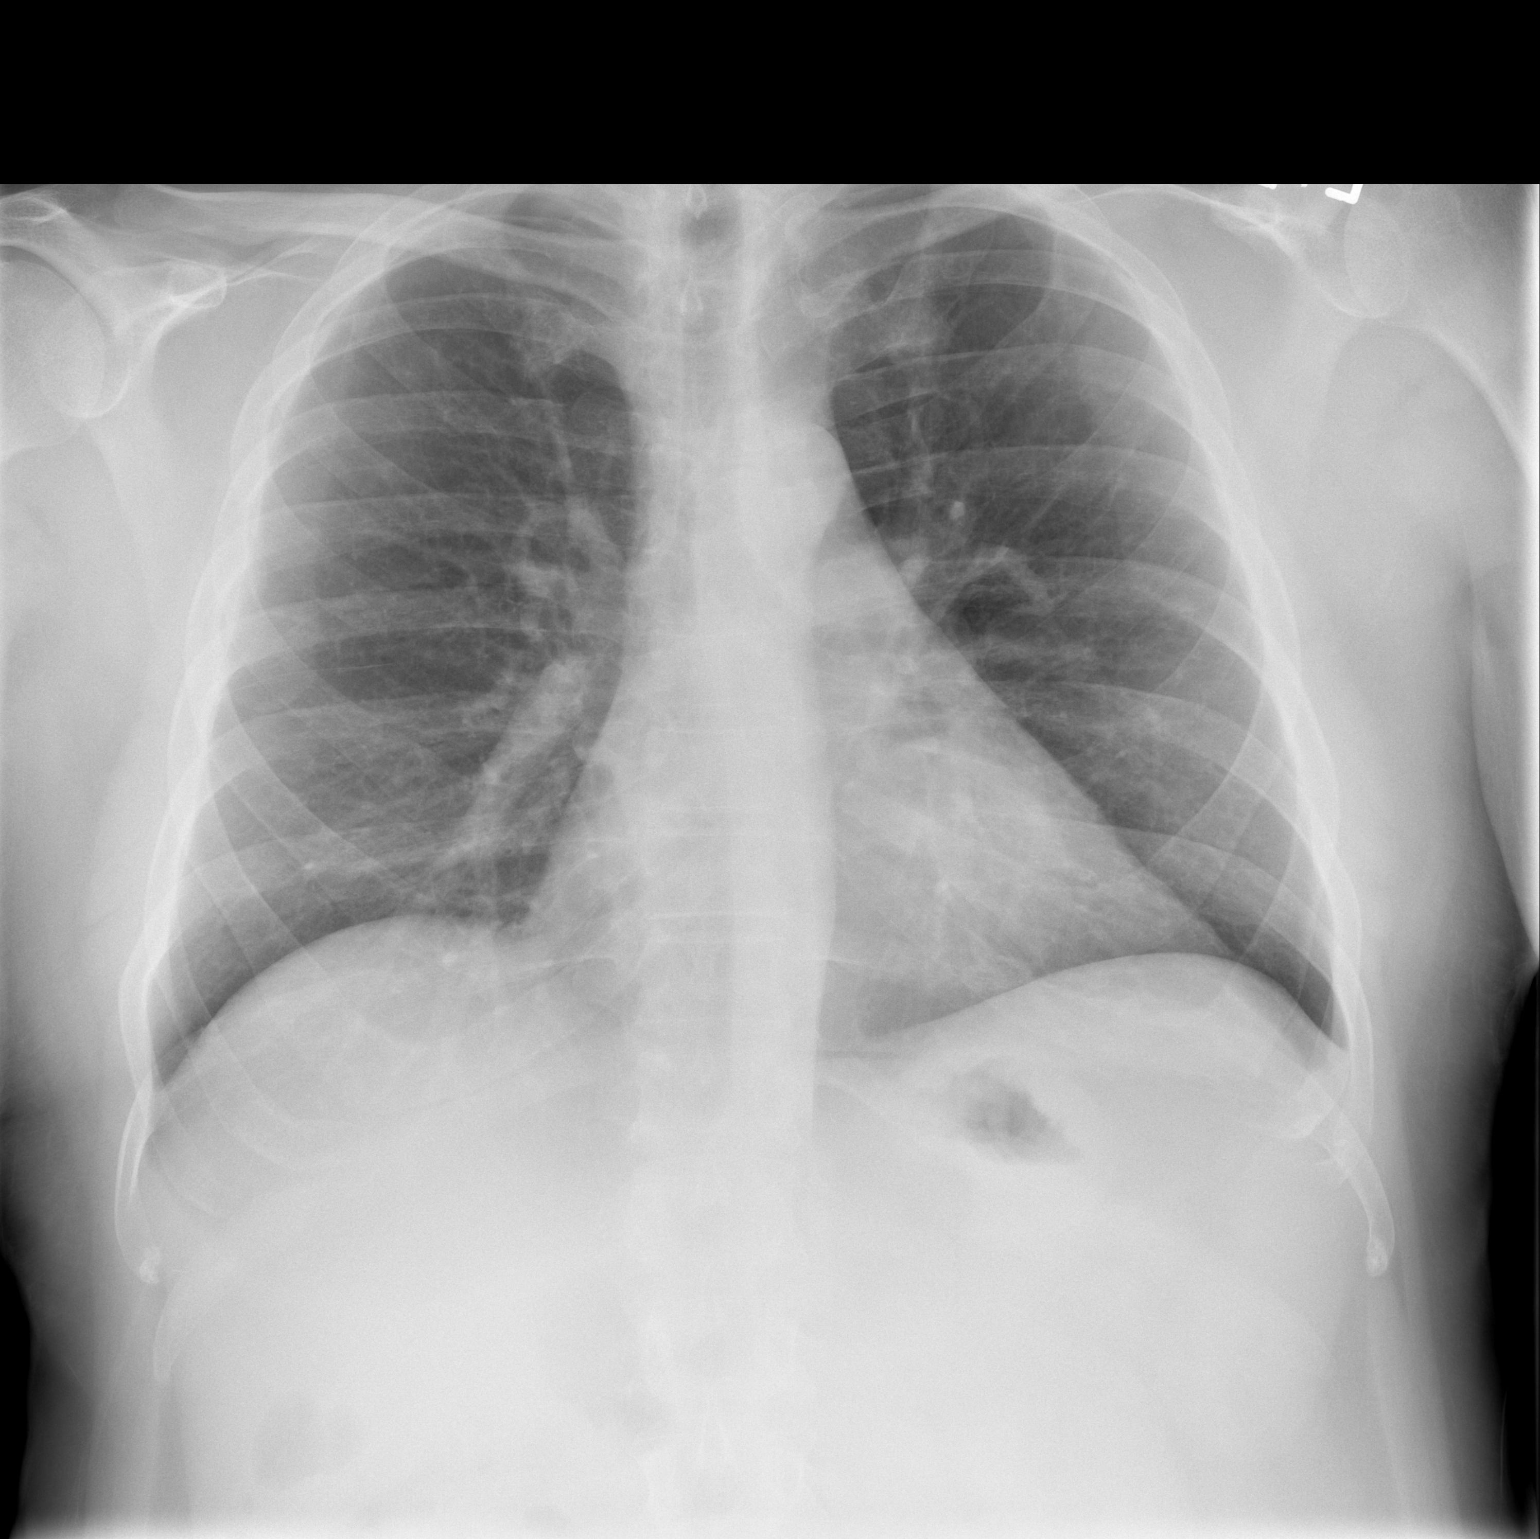

[w chest lat]
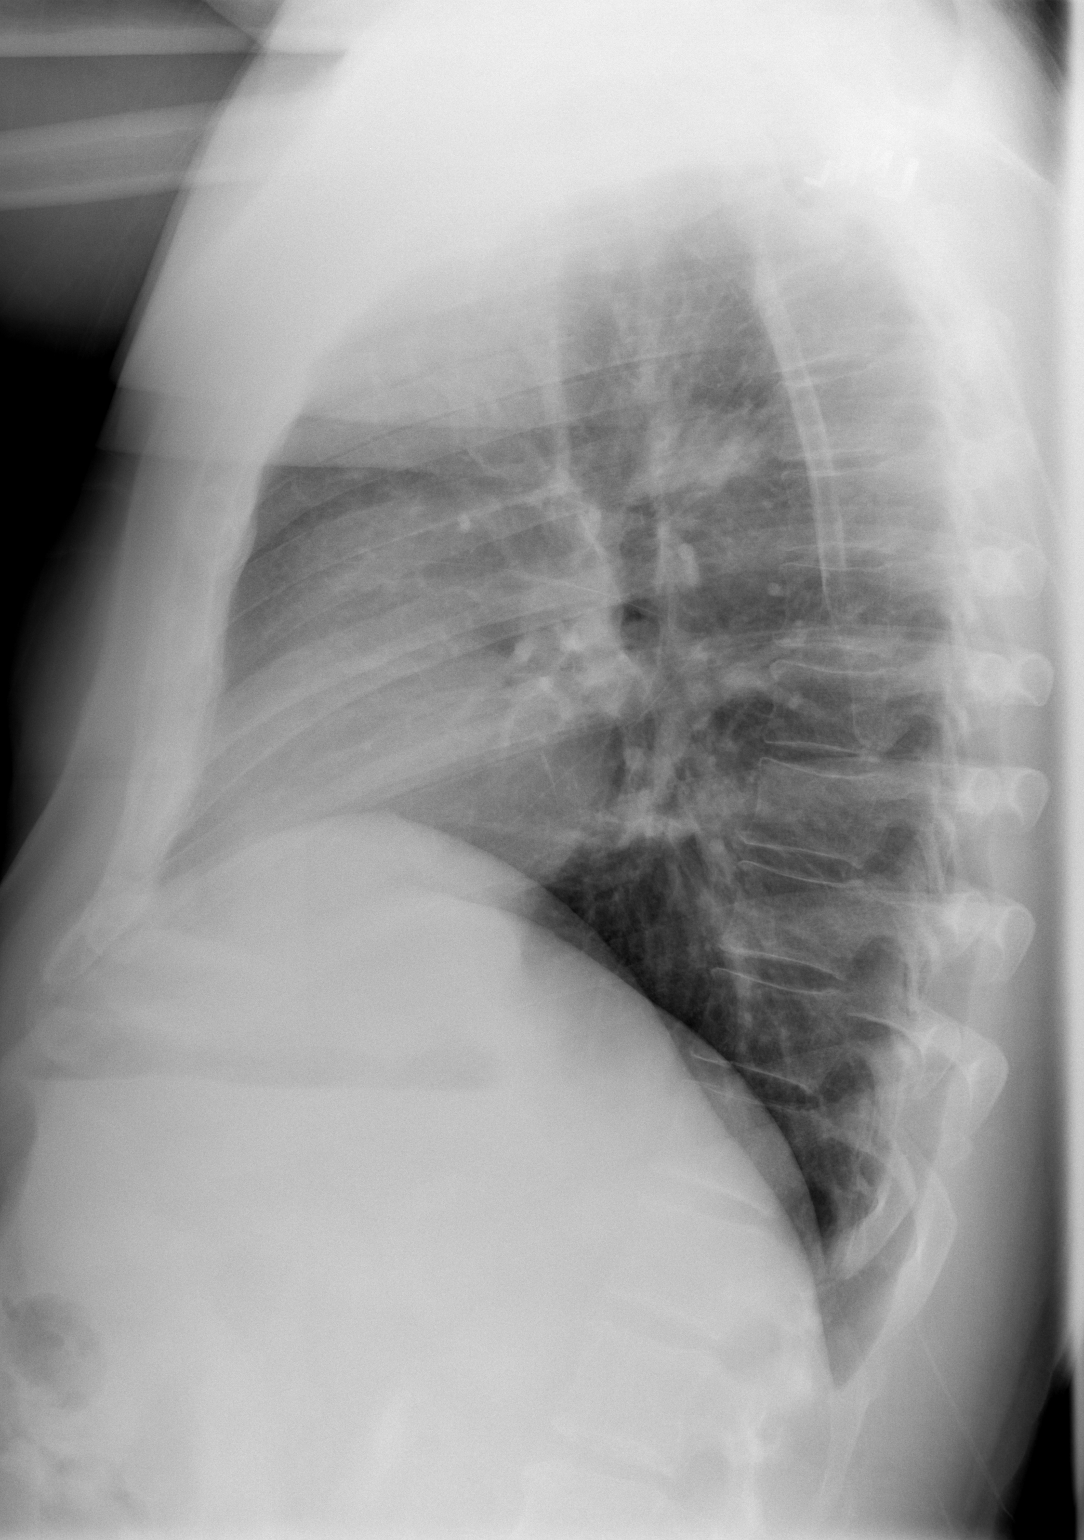

[2 of 2 positions shown; findings below may reference images not displayed]

FINDINGS: The cardiac silhouette, mediastinal and hilar contours
are within normal limits.  The lungs are clear.  The bony thorax is
intact.
IMPRESSION: 1.  No acute cardiopulmonary findings.

## 2009-02-27 ENCOUNTER — Ambulatory Visit: Payer: Self-pay | Admitting: Occupational Medicine

## 2009-02-27 DIAGNOSIS — J209 Acute bronchitis, unspecified: Secondary | ICD-10-CM

## 2009-02-27 HISTORY — DX: Acute bronchitis, unspecified: J20.9

## 2009-03-07 ENCOUNTER — Ambulatory Visit: Payer: Self-pay | Admitting: Internal Medicine

## 2009-03-07 DIAGNOSIS — G4700 Insomnia, unspecified: Secondary | ICD-10-CM

## 2009-03-07 DIAGNOSIS — M542 Cervicalgia: Secondary | ICD-10-CM

## 2009-03-07 HISTORY — DX: Insomnia, unspecified: G47.00

## 2009-03-07 HISTORY — DX: Cervicalgia: M54.2

## 2009-08-21 ENCOUNTER — Encounter: Payer: Self-pay | Admitting: Internal Medicine

## 2009-08-22 ENCOUNTER — Encounter: Payer: Self-pay | Admitting: Internal Medicine

## 2009-12-12 ENCOUNTER — Telehealth: Payer: Self-pay | Admitting: Internal Medicine

## 2009-12-15 ENCOUNTER — Telehealth: Payer: Self-pay | Admitting: Internal Medicine

## 2010-01-17 ENCOUNTER — Inpatient Hospital Stay (HOSPITAL_COMMUNITY): Admission: RE | Admit: 2010-01-17 | Discharge: 2010-01-18 | Payer: Self-pay | Admitting: Orthopedic Surgery

## 2010-02-26 ENCOUNTER — Encounter: Payer: Self-pay | Admitting: Internal Medicine

## 2010-02-28 ENCOUNTER — Telehealth: Payer: Self-pay | Admitting: Internal Medicine

## 2010-03-05 ENCOUNTER — Telehealth: Payer: Self-pay | Admitting: Internal Medicine

## 2010-03-25 HISTORY — PX: OTHER SURGICAL HISTORY: SHX169

## 2010-03-27 ENCOUNTER — Other Ambulatory Visit: Payer: Self-pay | Admitting: Internal Medicine

## 2010-03-27 ENCOUNTER — Ambulatory Visit
Admission: RE | Admit: 2010-03-27 | Discharge: 2010-03-27 | Payer: Self-pay | Source: Home / Self Care | Attending: Internal Medicine | Admitting: Internal Medicine

## 2010-03-27 ENCOUNTER — Encounter: Payer: Self-pay | Admitting: Internal Medicine

## 2010-03-27 LAB — BASIC METABOLIC PANEL
BUN: 15 mg/dL (ref 6–23)
CO2: 30 mEq/L (ref 19–32)
Calcium: 9.7 mg/dL (ref 8.4–10.5)
Chloride: 106 mEq/L (ref 96–112)
Creatinine, Ser: 1.2 mg/dL (ref 0.4–1.5)
GFR: 68.61 mL/min (ref >60.00)
Glucose, Bld: 94 mg/dL (ref 70–99)
Potassium: 4.6 mEq/L (ref 3.5–5.1)
Sodium: 141 mEq/L (ref 135–145)

## 2010-03-27 LAB — CBC WITH DIFFERENTIAL/PLATELET
Basophils Absolute: 0 10*3/uL (ref 0.0–0.1)
Basophils Relative: 0.5 % (ref 0.0–3.0)
Eosinophils Absolute: 0.4 10*3/uL (ref 0.0–0.7)
Eosinophils Relative: 7.7 % — ABNORMAL HIGH (ref 0.0–5.0)
HCT: 43.3 % (ref 39.0–52.0)
Hemoglobin: 14.9 g/dL (ref 13.0–17.0)
Lymphocytes Relative: 31.5 % (ref 12.0–46.0)
Lymphs Abs: 1.8 10*3/uL (ref 0.7–4.0)
MCHC: 34.5 g/dL (ref 30.0–36.0)
MCV: 89.3 fl (ref 78.0–100.0)
Monocytes Absolute: 0.4 10*3/uL (ref 0.1–1.0)
Monocytes Relative: 8 % (ref 3.0–12.0)
Neutro Abs: 2.9 10*3/uL (ref 1.4–7.7)
Neutrophils Relative %: 52.3 % (ref 43.0–77.0)
Platelets: 192 10*3/uL (ref 150.0–400.0)
RBC: 4.85 Mil/uL (ref 4.22–5.81)
RDW: 12.8 % (ref 11.5–14.6)
WBC: 5.6 10*3/uL (ref 4.5–10.5)

## 2010-03-27 LAB — HEPATIC FUNCTION PANEL
ALT: 68 U/L — ABNORMAL HIGH (ref 0–53)
AST: 43 U/L — ABNORMAL HIGH (ref 0–37)
Albumin: 3.9 g/dL (ref 3.5–5.2)
Alkaline Phosphatase: 84 U/L (ref 39–117)
Bilirubin, Direct: 0.1 mg/dL (ref 0.0–0.3)
Total Bilirubin: 0.9 mg/dL (ref 0.3–1.2)
Total Protein: 7.7 g/dL (ref 6.0–8.3)

## 2010-03-27 LAB — TSH: TSH: 1.54 u[IU]/mL (ref 0.35–5.50)

## 2010-03-27 LAB — LIPID PANEL
Cholesterol: 191 mg/dL (ref 0–200)
HDL: 30.9 mg/dL — ABNORMAL LOW (ref >39.00)
LDL Cholesterol: 131 mg/dL — ABNORMAL HIGH (ref 0–99)
Total CHOL/HDL Ratio: 6
Triglycerides: 147 mg/dL (ref 0.0–149.0)
VLDL: 29.4 mg/dL (ref 0.0–40.0)

## 2010-03-27 LAB — PSA: PSA: 0.96 ng/mL (ref 0.10–4.00)

## 2010-04-04 ENCOUNTER — Telehealth: Payer: Self-pay | Admitting: Internal Medicine

## 2010-04-04 ENCOUNTER — Encounter: Payer: Self-pay | Admitting: Gastroenterology

## 2010-04-13 ENCOUNTER — Encounter: Payer: Self-pay | Admitting: Internal Medicine

## 2010-04-15 ENCOUNTER — Encounter: Payer: Self-pay | Admitting: Internal Medicine

## 2010-04-15 ENCOUNTER — Encounter: Payer: Self-pay | Admitting: Pediatrics

## 2010-04-15 ENCOUNTER — Encounter: Payer: Self-pay | Admitting: Orthopedic Surgery

## 2010-04-24 NOTE — Letter (Signed)
Summary: [facility]  [facility]   Imported By: Sherian Rein 08/24/2009 11:59:18  _____________________________________________________________________  External Attachment:    Type:   Image     Comment:   External Document

## 2010-04-24 NOTE — Letter (Signed)
Summary: [facility]  [facility]   Imported By: Sherian Rein 08/25/2009 13:20:00  _____________________________________________________________________  External Attachment:    Type:   Image     Comment:   External Document

## 2010-04-24 NOTE — Progress Notes (Signed)
    Immunization History:  Influenza Immunization History:    Influenza:  historical (12/11/2009)  [facility] [address] [place] Vaccine Administered, Fluvirin Dose, 0.5 ML Injection Site, Left Deltoid, IM Mfg., [employer] Date Administered, 12/11/2009 Lot number, [id number]

## 2010-04-24 NOTE — Progress Notes (Signed)
  Phone Note Refill Request Message from:  Fax from Pharmacy on December 15, 2009 10:46 AM  Refills Requested: Medication #1:  AMLODIPINE BESYLATE 5 MG  TABS 1po once daily-- no addtnl refill pt overdue for an appt   Dosage confirmed as above?Dosage Confirmed   Notes: [facility] Initial call taken by: Robin Ewing CMA Duncan Dull),  December 15, 2009 10:46 AM    Prescriptions: AMLODIPINE BESYLATE 5 MG  TABS (AMLODIPINE BESYLATE) 1po once daily-- no addtnl refill pt overdue for an appt  #90 x 0   Entered by:   Scharlene Gloss CMA (AAMA)   Authorized by:   Corwin Levins MD   Signed by:   Scharlene Gloss CMA (AAMA) on 12/15/2009   Method used:   [facility]ed [facility] ...       [facility]             ,          Ph:        Fax: [phone]   RxID:   [id number]

## 2010-04-24 NOTE — Letter (Signed)
Summary: [facility]   Imported By: Lester Gulfcrest 08/26/2009 09:21:57  _____________________________________________________________________  External Attachment:    Type:   Image     Comment:   External Document

## 2010-04-24 NOTE — Letter (Signed)
Summary: [facility]  [facility]   Imported By: Lester Osgood 03/02/2010 10:19:57  _____________________________________________________________________  External Attachment:    Type:   Image     Comment:   External Document

## 2010-04-24 NOTE — Progress Notes (Signed)
  Phone Note Call from Patient   Caller: Patient Summary of Call: Pt requesting refill of Amlodipine be sent to [facility] Initial call taken by: Brenton Grills CMA Duncan Dull),  February 28, 2010 3:13 PM  Follow-up for Phone Call        called patient back informed he is due to schedule appt. with [facility] and could send in 30 day supply of BP med. to local pharmacy. Patient agreed and schedule OV with JWJ Jan. 3, 2012 at 1:30. Follow-up by: Zella Ball Ewing CMA Duncan Dull),  February 28, 2010 3:23 PM    Prescriptions: AMLODIPINE BESYLATE 5 MG  TABS (AMLODIPINE BESYLATE) 1po once daily-- no addtnl refill pt overdue for an appt  #30 x 0   Entered by:   Scharlene Gloss CMA (AAMA)   Authorized by:   Corwin Levins MD   Signed by:   Scharlene Gloss CMA (AAMA) on 02/28/2010   Method used:   Faxed to ...       [facility]* (retail)       [address] [place], [place]  [address]       Ph: [phone]       Fax: [phone]   RxID:   [id number]

## 2010-04-25 ENCOUNTER — Telehealth: Payer: Self-pay | Admitting: Internal Medicine

## 2010-04-25 HISTORY — PX: KNEE ARTHROSCOPY: SUR90

## 2010-04-26 NOTE — Letter (Signed)
Summary: Colonoscopy Date Change Letter  [facility]  [address] [place], [place] [address]   Phone: [phone]  Fax: [phone]      April 04, 2010 MRN: [id number]   [facility] [address] [place], [place]  [address]   Dear Mr. Marone,   Previously you were recommended to have a repeat colonoscopy around this time. Your chart was recently reviewed by Dr. Claudette Head of [facility]. Follow up colonoscopy is now recommended in January 2015. This revised recommendation is based on current, nationally recognized guidelines for colorectal cancer screening and polyp surveillance. These guidelines are endorsed by the American Cancer Society, [employer] on Colorectal Cancer as well as numerous other major medical organizations.  Please understand that our recommendation assumes that you do not have any new symptoms such as bleeding, a change in bowel habits, anemia, or significant abdominal discomfort. If you do have any concerning GI symptoms or want to discuss the guideline recommendations, please call to arrange an office visit at your earliest convenience. Otherwise we will keep you in our reminder system and contact you 1-2 months prior to the date listed above to schedule your next colonoscopy.  Thank you,  Judie Petit T. Russella Dar, M.D.  [facility] [phone]

## 2010-04-26 NOTE — Progress Notes (Signed)
Summary: Rx refill req  Phone Note Call from Patient   Caller: Patient [phone] Summary of Call: Pt called stating he will run out of Lamictal 200mg  bid before appt 01/03. Pt was having meds filled by Dr Thad Ranger but he has an outstanding bill + specialist copay to expensive and he states that he cannot follow up with them. Pt is requesting refill form [facility], please advise. Initial call taken by: Margaret Pyle, CMA,  March 05, 2010 2:15 PM  Follow-up for Phone Call        done hardcopy to LIM side B - dahlia  Follow-up by: Corwin Levins MD,  March 05, 2010 5:59 PM  Additional Follow-up for Phone Call Additional follow up Details #1::        left message with spouse for pt to return my call. Margaret Pyle, CMA  March 06, 2010 8:22 AM   Rx faxed to [facility] Additional Follow-up by: Margaret Pyle, CMA,  March 06, 2010 11:36 AM    New/Updated Medications: LAMICTAL 200 MG  TABS (LAMOTRIGINE) 1 by mouth two times a day Prescriptions: LAMICTAL 200 MG  TABS (LAMOTRIGINE) 1 by mouth two times a day  #60 x 5   Entered and Authorized by:   Corwin Levins MD   Signed by:   Corwin Levins MD on 03/05/2010   Method used:   Print then Give to Patient   RxID:   [id number]

## 2010-04-26 NOTE — Letter (Signed)
Summary: Medical Exam Report/[place] [facility]  Medical Exam Report/[facility] [facility]   Imported By: Sherian Rein 04/06/2010 08:42:24  _____________________________________________________________________  External Attachment:    Type:   Image     Comment:   External Document

## 2010-04-26 NOTE — Letter (Signed)
Summary: [facility]  [facility]   Imported By: Sherian Rein 04/06/2010 08:46:35  _____________________________________________________________________  External Attachment:    Type:   Image     Comment:   External Document
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
## 2010-04-26 NOTE — Progress Notes (Signed)
  Phone Note Call from Patient Call back at Home Phone [phone]   Caller: Patient Summary of Call: Pt called stating he would like to start Lipitor 10mg  for elevated cholesterol. Rx sent to pharmacy per pt req, pt informed.  Will pt need follow up labs in 6 weeks? Initial call taken by: Margaret Pyle, CMA,  April 04, 2010 1:18 PM  Follow-up for Phone Call        yes and yes:  lipids 272.0, and hepatic function panel: v58.69 Follow-up by: Corwin Levins MD,  April 04, 2010 1:21 PM  Additional Follow-up for Phone Call Additional follow up Details #1::        Pt advised of labs and scheduled for the week of Feb 20th. Additional Follow-up by: Margaret Pyle, CMA,  April 04, 2010 1:32 PM    New/Updated Medications: LIPITOR 10 MG TABS (ATORVASTATIN CALCIUM) 1 tablet by mouth once daily Prescriptions: LIPITOR 10 MG TABS (ATORVASTATIN CALCIUM) 1 tablet by mouth once daily  #30 x 5   Entered by:   Margaret Pyle, CMA   Authorized by:   Corwin Levins MD   Signed by:   Margaret Pyle, CMA on 04/04/2010   Method used:   Electronically to        [facility]* (retail)       [address] [place], [place]  [address]       Ph: [phone]       Fax: [phone]   RxID:   [id number]

## 2010-05-02 NOTE — Progress Notes (Signed)
Summary: Rx?  Phone Note Call from Patient Call back at [facility] Phone [phone]   Caller: Patient Summary of Call: Pt called stating he was prescribed wrong dosage of Clonazepam by [facility], pt states it should have been 2mg  2 by mouth at bedtime not 1mg . Please review registry result. Pt is also requesting Rx for Amitriptyline because he is having trouble sleeping. Pt says this Rx was discussed at last OV. Initial call taken by: Margaret Pyle, CMA,  April 25, 2010 2:03 PM  Follow-up for Phone Call        i would cont clonazepam as is for now (has not had the higher strength rx since I have been prescribing this), but ok to re-start the amytriptilene as he suggests -   done hardcopy to LIM side B - dahlia  Follow-up by: Corwin Levins MD,  April 25, 2010 2:26 PM  Additional Follow-up for Phone Call Additional follow up Details #1::        Pt advised and will continue with medications as Rx'd. New Rx to [facility] per pt req. Additional Follow-up by: Margaret Pyle, CMA,  April 25, 2010 3:32 PM    New/Updated Medications: AMITRIPTYLINE HCL 100 MG TABS (AMITRIPTYLINE HCL) 1po at bedtime Prescriptions: AMITRIPTYLINE HCL 100 MG TABS (AMITRIPTYLINE HCL) 1po at bedtime  #90 x 1   Entered and Authorized by:   Corwin Levins MD   Signed by:   Corwin Levins MD on 04/25/2010   Method used:   Print then Give to Patient   RxID:   [id number]  done hardcopy to LIM side B - dahlia  Corwin Levins MD  April 25, 2010 2:26 PM

## 2010-05-10 NOTE — Medication Information (Signed)
Summary: RX History Report   RX History Report   Imported By: Lennie Odor 04/30/2010 11:20:27  _____________________________________________________________________  External Attachment:    Type:   Image     Comment:   External Document

## 2010-05-14 ENCOUNTER — Other Ambulatory Visit: Payer: Self-pay

## 2010-05-22 ENCOUNTER — Other Ambulatory Visit: Payer: Self-pay | Admitting: Internal Medicine

## 2010-05-22 ENCOUNTER — Other Ambulatory Visit: Payer: Medicare PPO

## 2010-05-22 ENCOUNTER — Encounter (INDEPENDENT_AMBULATORY_CARE_PROVIDER_SITE_OTHER): Payer: Self-pay | Admitting: *Deleted

## 2010-05-22 DIAGNOSIS — E7800 Pure hypercholesterolemia, unspecified: Secondary | ICD-10-CM

## 2010-05-22 DIAGNOSIS — Z79899 Other long term (current) drug therapy: Secondary | ICD-10-CM

## 2010-05-22 LAB — LIPID PANEL
Cholesterol: 127 mg/dL (ref 0–200)
HDL: 33.9 mg/dL — ABNORMAL LOW (ref >39.00)
LDL Cholesterol: 61 mg/dL (ref 0–99)
Triglycerides: 161 mg/dL — ABNORMAL HIGH (ref 0.0–149.0)
VLDL: 32.2 mg/dL (ref 0.0–40.0)

## 2010-05-22 LAB — HEPATIC FUNCTION PANEL
Albumin: 4 g/dL (ref 3.5–5.2)
Alkaline Phosphatase: 85 U/L (ref 39–117)
Bilirubin, Direct: 0.2 mg/dL (ref 0.0–0.3)

## 2010-06-04 ENCOUNTER — Telehealth: Payer: Self-pay | Admitting: Internal Medicine

## 2010-06-04 ENCOUNTER — Encounter: Payer: Self-pay | Admitting: Internal Medicine

## 2010-06-06 LAB — BASIC METABOLIC PANEL
BUN: 12 mg/dL (ref 6–23)
CO2: 30 mEq/L (ref 19–32)
Calcium: 9.4 mg/dL (ref 8.4–10.5)
GFR calc non Af Amer: >60 mL/min (ref >60)
Glucose, Bld: 96 mg/dL (ref 70–99)

## 2010-06-06 LAB — CBC
MCH: 30.5 pg (ref 26.0–34.0)
MCHC: 35 g/dL (ref 30.0–36.0)
RDW: 12.8 % (ref 11.5–15.5)

## 2010-06-06 LAB — SURGICAL PCR SCREEN
MRSA, PCR: NEGATIVE
Staphylococcus aureus: POSITIVE — AB

## 2010-06-12 NOTE — Medication Information (Signed)
Summary: Pt Rx Hx  Pt Rx Hx   Imported By: Sherian Rein 06/08/2010 13:00:35  _____________________________________________________________________  External Attachment:    Type:   Image     Comment:   External Document

## 2010-06-21 NOTE — Progress Notes (Signed)
Summary: Rx change  Phone Note Call from Patient Call back at Home Phone [phone]   Caller: Patient Summary of Call: Pt called requesting Rx for Clonazepam be changed from 1mg  2 tabs at bedtime to 2mg  1 tab at bedtime. Pt is requesting change so he will take 1 tab instaed of 2 daily. Please advise. Initial call taken by: Margaret Pyle, CMA,  June 04, 2010 4:44 PM  Follow-up for Phone Call        ok , but we will need to contact his pharmacy to cancel the current clonazepam rx;  is this ok?  also need query done of controlled substance database Follow-up by: Corwin Levins MD,  June 04, 2010 5:04 PM  Additional Follow-up for Phone Call Additional follow up Details #1::        Control substance query done and on MD's desk for review. Margaret Pyle, CMA  June 05, 2010 9:07 AM     Additional Follow-up for Phone Call Additional follow up Details #2::    ok - done hardcopy to LIM side B - dahlia   per query, pt has not filled the most recent clonazepam rx - please bring back the old rx to exchange for the new one Follow-up by: Corwin Levins MD,  June 06, 2010 1:18 PM  Additional Follow-up for Phone Call Additional follow up Details #3:: Details for Additional Follow-up Action Taken: Will be addressed in EPIC Additional Follow-up by: Margaret Pyle, CMA,  June 11, 2010 4:38 PM  New/Updated Medications: CLONAZEPAM 2 MG TABS (CLONAZEPAM) 1 by mouth at bedtime as needed Prescriptions: CLONAZEPAM 2 MG TABS (CLONAZEPAM) 1 by mouth at bedtime as needed  #30 x 2   Entered and Authorized by:   Corwin Levins MD   Signed by:   Corwin Levins MD on 06/06/2010   Method used:   Print then Give to Patient   RxID:   [id number]

## 2010-07-03 LAB — CBC
HCT: 45.9 % (ref 39.0–52.0)
MCHC: 34.9 g/dL (ref 30.0–36.0)
Platelets: 147 10*3/uL — ABNORMAL LOW (ref 150–400)
RDW: 14.9 % (ref 11.5–15.5)

## 2010-07-03 LAB — URINALYSIS, ROUTINE W REFLEX MICROSCOPIC
Ketones, ur: 15 mg/dL — AB
Nitrite: NEGATIVE
Protein, ur: NEGATIVE mg/dL
Urobilinogen, UA: 1 mg/dL (ref 0.0–1.0)

## 2010-07-03 LAB — CK TOTAL AND CKMB (NOT AT ARMC): Total CK: 65 U/L (ref 7–232)

## 2010-07-03 LAB — CULTURE, BLOOD (ROUTINE X 2)
Culture: NO GROWTH
Culture: NO GROWTH

## 2010-07-03 LAB — DIFFERENTIAL
Basophils Relative: 0 % (ref 0–1)
Lymphs Abs: 0.4 10*3/uL — ABNORMAL LOW (ref 0.7–4.0)
Monocytes Absolute: 0.4 10*3/uL (ref 0.1–1.0)
Monocytes Relative: 3 % (ref 3–12)
Neutro Abs: 11.2 10*3/uL — ABNORMAL HIGH (ref 1.7–7.7)

## 2010-07-03 LAB — COMPREHENSIVE METABOLIC PANEL
ALT: 28 U/L (ref 0–53)
Albumin: 3.8 g/dL (ref 3.5–5.2)
Alkaline Phosphatase: 94 U/L (ref 39–117)
BUN: 16 mg/dL (ref 6–23)
Calcium: 8.9 mg/dL (ref 8.4–10.5)
Potassium: 4.1 mEq/L (ref 3.5–5.1)
Sodium: 139 mEq/L (ref 135–145)
Total Protein: 7.4 g/dL (ref 6.0–8.3)

## 2010-07-03 LAB — POCT CARDIAC MARKERS: CKMB, poc: <1 ng/mL — ABNORMAL LOW (ref 1.0–8.0)

## 2010-08-07 NOTE — Discharge Summary (Signed)
[name], [name] NO.:  [id number]   MEDICAL RECORD NO.:  [id number]          PATIENT TYPE:  INP   LOCATION:  5529                         FACILITY:  [facility]   PHYSICIAN:  Barbette Hair. Artist Pais, DO      DATE OF BIRTH:  30-Jan-1956   DATE OF ADMISSION:  07/29/2006  DATE OF DISCHARGE:  08/02/2006                               DISCHARGE SUMMARY   DISCHARGE DIAGNOSES:  1. Chronic low back pain with a broad-based bulge and annular tear of      L4-L5 with mild biforaminal and lateral recess narrowing.  2. L5-S1 left para central disk protrusion.  3. History of factor V Leiden chronic anticoagulation.  4. Seizure disorder, on Lamictal.  5. Gastroesophageal reflux disease.  6. Recent history of toxic metabolic encephalopathy.  7. History of pneumonia.  8. History obstructive sleep apnea, on CPAP.  9. Anxiety/depression.  10.Chronic pain syndrome.   DISCHARGE MEDICATIONS:  1. Lyrical 75 mg 3 x day.  2. Coumadin, to resume previous home dose.  3. Lamictal 200 mg twice daily.  4. Paxil 40 mg once daily.  5. Klonopin 1 mg at bedtime.  6. Amitriptyline 50 mg at bedtime.  7. OxyContin SR 10 mg twice per day.  8. Erythromycin ophthalmic ointment twice per day.   FOLLOWUP INSTRUCTIONS:  He is to call the [facility] office for a follow-up  appointment with Dr. Jonny Ruiz within one week; also follow up with physical  therapy as an outpatient.   HOSPITAL COURSE:  The patient is a 55 year old white male with a complex  medical history who presented with lower extremity weakness and  significant low back pain.  The patient was noted to have been recently  admitted to the neurology service and found to have toxic metabolic  encephalopathy related to acute renal failure and resolving pneumonia.  Due to patient's lower extremity symptoms he was admitted for  observation and also a neurology consult was obtained.  Dr. Nash Shearer  ordered an MRI scan of the L-spine with the following results:   Broad-  based disk bulge and annular tear at L4-L5 with mild biforaminal and  lateral recess narrowing, left greater than right; also left para  central disk protrusion at L5-S1 potentially contacting to exiting L5  nerve root; and prominent epidural fat compatible with epidural  lipomatosis at the 5-S1 level was noted.   The patient insisted on neurosurgical consultation, despite being poor  surgical candidate due to anticoagulation and history of fibromyalgia.  However, the patient was seen by neurosurgery who felt he was not a good  surgical candidate for L5 spondylolysis and spondylolisthesis.  They  recommended continued pain medications and outpatient physical therapy.   History of factor V Leiden, on chronic Coumadin.  The patient's INR  remained stable and therapeutic during his hospitalization.   Obstructive sleep apnea:  Patient was maintained on his CPAP.  He did  not experience any respiratory difficulties.   Pertinent physical exam findings as noted by Dr. Maeola Harman,  neurosurgeon:  Diffuse weakness in both upper extremities, left greater  than right.  He had give away  weakness in his left lower extremity and  appeared to exert non maximal effort.  He had a nondermatomal pattern of  numbness.  His reflexes are +2 in the biceps, triceps and  brachioradialis, and 2 at the knees, 2 at the ankles, and the great toe  is downgoing to plantar stimulation.   LABORATORY DATA:  His INR on the date of discharge was 3.1.   CONDITION ON DISCHARGE:  The patient was able to ambulate and was felt  stable for discharge and received maximal benefit from hospitalization.  The patient was advised to follow-up with Dr. Jonny Ruiz regarding further  titration of pain medication versus other modalities for treating low  back pain.      Barbette Hair. Artist Pais, DO  Electronically Signed     RDY/MEDQ  D:  08/02/2006  T:  08/02/2006  Job:  [id number]   cc:   Corwin Levins, MD
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
## 2010-08-07 NOTE — Consult Note (Signed)
NAMELANE, ELAND NO.:  [id number]   MEDICAL RECORD NO.:  [id number]          PATIENT TYPE:  INP   LOCATION:  1318                         FACILITY:  [facility]   PHYSICIAN:  Iva Boop, MD,FACGDATE OF BIRTH:  [birth year]   DATE OF CONSULTATION:  10/01/2006  DATE OF DISCHARGE:                                 CONSULTATION   ADDENDUM:   Note his H-Pylori antibody is positive.  It is not clear to me what that  means as this is a serology.  I do not think this man has active peptic  ulcer disease.  I would not treat that at this time given the other  issues.      Iva Boop, MD,FACG  Electronically Signed     CEG/MEDQ  D:  10/01/2006  T:  10/02/2006  Job:  [id number]

## 2010-08-07 NOTE — H&P (Signed)
[name], [name]            ACCOUNT NO.:  [id number]   MEDICAL RECORD NO.:  [id number]          PATIENT TYPE:  INP   LOCATION:  6731                         FACILITY:  [facility]   PHYSICIAN:  Hollice Espy, M.D.DATE OF BIRTH:  06/26/1955   DATE OF ADMISSION:  06/26/2007  DATE OF DISCHARGE:                              HISTORY & PHYSICAL   PRIMARY CARE PHYSICIAN:  Corwin Levins, MD   CHIEF COMPLAINTS:  Abdominal pain.   HISTORY OF PRESENT ILLNESS:  The patient is a 55 year old white male  with past medical history of Factor-V Leiden deficiency with PE seizure  disorder, and recent discharge from [facility]  approximately less than 2 weeks ago for rectal bleeding.  He now  presents with abdominal pain.  The patient, based on the discharge  summary, had some episodes of nonspecific abdominal pain during his  hospitalization.  He says that he has not removed his dialysis since he  has been discharged, and now comes in complaining of abdominal pain.  He  had an abdominal x-ray done, which showed a large amount of stool in the  colon but not in the rectal vault.  The rest of his labs and workup were  unremarkable.  They tried to give him medication to get his bowels to  move, but this was met with little success.   The patient is currently complaining of some abdominal distention and  discomfort.  He denies any headaches or vision changes.  He does feel  slightly nauseous.  No vomiting.  No dysphagia.  No chest pain or  palpitations.  No shortness of breath, wheeze, cough.  No diarrhea.  No  hematuria, no dysuria; no focal extremity numbness, weakness or pain.  The review of systems was otherwise negative.   PAST MEDICAL HISTORY:  1. Factor-V Leiden deficiency.  2. History of deep vein thrombosis.  3. History of pulmonary embolus.  4. Seizure disorder.  5. Chronic low back pain.  6. Depression.  7. Fibromyalgia.  8. Recent rectal bleeding episode.   MEDICATIONS:  1. Clonazepam one nightly.  2. Norvasc 5 mg.  3. Coumadin 5 mg.  4. Elavil 50 mg nightly.  5. Lamictal 200 mg b.i.d.  6. Nexium 40 mg.  7. Fluoxetine 40 mg.  8. Oxycodone 5 mg q.6 h. p.r.n.   ALLERGIES:  NO KNOWN DRUG ALLERGIES.   SOCIAL HISTORY:  Denies any tobacco, alcohol or drug use.   FAMILY HISTORY:  Noncontributory.   </PHYSICAL EXAMINATION  VITALS ON ADMISSION:  Temperature 97.5, heart rate 105 and now down to  84, blood pressure 121/82, respirations 22, O2 saturation 94% on room  air.  GENERAL: He is alert and oriented x3; in some mild distress secondary to  abdominal pain.  HEENT: Normocephalic, atraumatic.  Mucous membranes  slightly dry.  NECK:  He has no carotid bruits.  HEART:  Regular rate and rhythm.  S1 and S2.  LUNGS:  Clear to auscultation bilaterally.  ABDOMEN:  Soft, distended; nonspecific generalized tenderness.  Scant  bowel sounds.  EXTREMITIES:  Show no clubbing, cyanosis or edema.   LAB WORK:  White count 6, hemoglobin 13.3, hematocrit 37,  platelet  count 220 with no shift.  Sodium 134, potassium 4.5, chloride 98, bicarb  25, BUN 11, creatinine 1.2, glucose 80.  LFTs unremarkable.  UA is  negative.  INR is 3.   PLAN:  1. Abdominal Pain.  This may be constipation versus just nonspecific      chronic abdominal pain; but,  given his constipation seen on x-ray,      we will observe, give IV fluids and start him on a bowel regimen.  2. History of Factor-V Leiden Deficiency.  With deep vein thrombosis      and pulmonary embolus.  Anticoagulate.  INR therapeutic.  3. Seizure Disorder.  On Lamictal.      Hollice Espy, M.D.  Electronically Signed     SKK/MEDQ  D:  06/26/2007  T:  06/26/2007  Job:  [id number]

## 2010-08-07 NOTE — Consult Note (Signed)
NAMEJAICOB, DIA NO.:  [id number]   MEDICAL RECORD NO.:  [id number]          PATIENT TYPE:  INP   LOCATION:  3036                         FACILITY:  [facility]   PHYSICIAN:  Melvyn Novas, M.D.  DATE OF BIRTH:  03-Jan-1956   DATE OF CONSULTATION:  02/20/2007  DATE OF DISCHARGE:                                 CONSULTATION   REFERRING PHYSICIAN:  Sean A. Everardo All, MD   This patient is well-known to the neurologic practice and has presented  with multiple paroxysmal spells that have so far never been found to be  of organic origin.   The patient has a history of depression and anxiety disorder.  Is  suspected to have a history of a personality disorder.   Is also diagnosed with fibromyalgia, hypertension, and factor V Leiden,  GERD, and has recently been treated for right lower extremity pain and  periodic weakness by Dr. Kelli Hope with Lyrica under the  assumption that this might be hepatic coagulopathy.   The patient is on Coumadin for his factor V Leiden and takes also  Lamictal 12 mg twice a day, Paxil 40 mg once a day, Elavil 50 mg once a  day, clonazepam 1 mg in the morning.  This patient has self-adjusted his  clonazepam medication, and according to his wife takes up to 3 mg a day.  He was placed on Lyrica 75 mg three times a day from previously twice a  day.  This was a recent adjustment by Dr. Thad Ranger who saw him a week  ago.  He is still on Norvasc 5 mg daily.   ALLERGIES:  The patient is allergic to topiramate.   HISTORY OF PRESENT ILLNESS:  The patient's History of Present Illness  for today's admission is that the patient had another paroxysmal spell,  fell, hit his face.  Hurt his face as he states.  Also there are no  abrasions and no hematoma visible.  Following this spell and him being  brought by EMS to the hospital were hours of paralysis, but the patient  was mentally and cognitively intact.   SOCIAL HISTORY:  The patient is  married, disabled.  Hobbies:  He drives  motorcycles and all terrain vehicles.   MEDICATIONS:  As above.   PHYSICAL EXAMINATION:  VITAL SIGNS:  110/85, heart rate 68 and regular,  respiratory rate 16.  LUNGS:  No rhonchi or bruit noted.  The patient was yesterday also  examined by Dr. Lorre Nick in the ER who stated that his telemetry  was normal for the whole duration of his ER waiting time.  NEUROLOGICAL:  Mental status:  The patient does not remember last  appointment with Dr. Thad Ranger and guesses it was about a month or so ago  while his wife states that he was just seen last week.  He does not  remember the last admission to the hospital.  I asked him if he recalls  me from previous admissions, and he denies.  He shows fluent speech, no  dysarthria, no apraxia.  He also is not able to recognize our pharmacist  who  sees the patient in the Coumadin Clinic.  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  Tongue and uvula midline.  Motor examination:  Equal grip strength with downgoing toes bilaterally.  The patient has  full passive range of motion for the lower extremities and normal tone.  Sensory:  The patient is complaining of painful neuropathy combination  with numbness in both lower extremities and new complaint of a right  lower extremity weakness that is  paroxysmally also leading to falls.  However, the patient also loses consciousness with these falls, and  therefore would not be explained by hepatic coagulopathy alone.  Distribution of his pain is in the L4-L5 distribution which has not  affected patellar reflexes.   ASSESSMENT:  1. Spells likely nonorganic, 780.39.  2. Right lower extremity radiculopathy.  According to the [facility] there      has been no recent imaging study.  I would suggest to perhaps      perform lumbar MRI if none is available through our office.  I      would also recommend a psychiatric consultation for this  patient.      I would not change his current medications and discharge him home      on either the current medications or decrease Lyrica by 75 mg on      mid day dose as the patient seems to be convinced that this is the      cause of his current symptoms.   LABORATORY DATA:  The patient's laboratory tests from yesterday shows  the following:   Protime 31.2, INR 2.9, creatinine 1.2.  Venous pH value 7.368. CO2 49.1  mmHg, acid base excess 2.  Glucose was 112.  H&H was 13.9/41.  Sodium  139, potassium 3.8, glucose was slightly elevated but the test was taken  at 4:30 in the afternoon, and the patient had Thanksgiving dinner prior.  TSH 0.947.  Sed rate was 15.  CK-MB was 89 and 1.5.  A Lamictal level was not sent  this time.  The last time he was evaluated in October when I saw him at  the [facility] ER for a change the patient had a Lamictal level 14.6  which was in the upper range of therapeutic recommendations.  On the  same day his phenytoin level was less than 2.5 and therefore  undetectable.  I had performed a toxicology screen at that time which  was positive for benzodiazepines which I included in the patient's  prescriptions.      Melvyn Novas, M.D.  Electronically Signed     CD/MEDQ  D:  02/20/2007  T:  02/20/2007  Job:  [id number]   cc:   Casimiro Needle L. Thad Ranger, M.D.

## 2010-08-07 NOTE — Discharge Summary (Signed)
NAMEERRIN, [name] NO.:  [id number]   MEDICAL RECORD NO.:  [id number]          PATIENT TYPE:  INP   LOCATION:  5530                         FACILITY:  [facility]   PHYSICIAN:  Gordy Savers, MDDATE OF BIRTH:  October 15, 1955   DATE OF ADMISSION:  06/10/2007  DATE OF DISCHARGE:  06/13/2007                               DISCHARGE SUMMARY   FINAL DIAGNOSIS:  Bright red bleeding per rectum.   DISCHARGE DIAGNOSES:  Factor V Leiden deficiency with history of deep  venous thrombosis and pulmonary embolism, seizure disorder, chronic low  back pain, depression, gastroesophageal reflux disease, and  fibromyalgia.   DISCHARGE MEDICATIONS:  1. Clonazepam 1 mg at bedtime.  2. Amlodipine 5 mg daily.  3. Warfarin daily.  4. Amitriptyline 50 mg nightly.  5. Lamictal 200 mg twice daily.  6. Nexium 40 mg.  7. Fluoxetine 40 mg daily.  8. Oxycodone 5 every 6 hours p.r.n. severe pain.   HISTORY OF PRESENT ILLNESS:  The patient is a 55 year old gentleman with  multiple medical problems who presented to the hospital complaining of  bright red blood per rectum.  The patient was admitted for further  evaluation and treatment.  During the period of observation, the patient  had very little on the way significant lower GI bleeding.  Stool  specimen was obtained and was heme positive for blood.  He basically had  very little in the way of bowel movements and only as scantly blood  noted on the tissue paper.  H&H 13 and 38.6 during the hospital.  He was  seen and consultation by the GI service, and the CT of the abdomen  and  pelvis was performed this revealed fatty liver but otherwise no  abnormalities.  During the course of the admission, the patient  continued to complain of abdominal pain that was very not specific in  nature and clinical exam was unremarkable.  It was felt that the  patient's bright red bleeding per rectum was most likely secondary to an  internal  hemorrhoid.   DISPOSITION:  The patient was discharged today with follow up as an  outpatient.  If bleeding persist, he will undergo a sigmoidoscopy for  further evaluation.  His last colonoscopy was in January 2005 and did  confirm an internal hemorrhoid at that time.  He will be discharge on  his preadmission medications.   CONDITION ON DISCHARGE:  Stable.      Gordy Savers, MD  Electronically Signed     PFK/MEDQ  D:  06/13/2007  T:  06/14/2007  Job:  [id number]
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
## 2010-08-07 NOTE — Op Note (Signed)
NAMEKRYSTOFER, [name] NO.:  [id number]   MEDICAL RECORD NO.:  [id number]          PATIENT TYPE:  INP   LOCATION:  6730                         FACILITY:  [facility]   PHYSICIAN:  Bernette Redbird, M.D.   DATE OF BIRTH:  September 05, 1955   DATE OF PROCEDURE:  06/28/2007  DATE OF DISCHARGE:                               OPERATIVE REPORT   PROCEDURE:  Colonoscopy.   INDICATION:  A 55 year old with rectal bleeding, negative colonoscopy 4  years ago, negative sigmoidoscopy apart from hemorrhoids about 8 months  ago, was hemoccult positive on recent admission in association with  rectal bleeding and was readmitted several days ago with recurrent  bleeding.   FINDINGS:  Grossly normal exam to the cecum.  Mild internal hemorrhoids.  No blood present.   PROCEDURE IN DETAIL:  Then nature, purpose and risks of the procedure  were discussed with the patient who provided written consent.  This  procedure was intended as flexible sigmoidoscopy.  He had been given  some MiraLax because of constipation and was given several Fleet enemas.  He was brought from his hospital room to the endoscopy unit in a fasted  state.  Sedation was Versed 2 mg IV using oxygen face mask because of  his history of sleep apnea.  There was no problem with any desaturation  whatsoever and, despite the light sedation given, the patient was  comfortable throughout the procedure.   The Pentax pediatric video colonoscope was inserted following an  unremarkable digital exam including a grossly normal lower portion of  the prostate gland which could be palpated despite his somewhat large  body habitus.   The scope advanced so easily and the prep was so good that, in view of  the fact that we anticipate eventual need for a possible full  colonoscopy, I went ahead and kept advancing.  The patient continued to  tolerate it well and we got to what I believed was the cecum as  visualized by what appeared to be the  ileocecal valve.  I could not  identify a discrete appendiceal orifice due to some vegetable debris and  residual stool material in the cecum.  This was irrigated to a large  degree allowing me to visualize the mucosa through a film of water, but  I could not really suction things up because the vegetable debris kept  clogging the scope.  We did rotate the patient 90 degrees to hopefully  shift the pool.  Despite that much of the cecal surface was never  definitively seen.  Again, however, because the scope was used to  irrigate at close range to the mucosa it is felt that no major lesions  were likely to have been missed on the floor of the cecum.  The prep  elsewhere in the colon was adequate to exclude essentially all polyps.   This was a normal examination.  No polyps were seen nor was there any  evidence of cancer, colitis or vascular ectasia.  I did not appreciate  any diverticulosis.  There was no blood in the colonic lumen.  Retroflexion in the rectum was  unremarkable and interestingly, pullout  through the anal canal disclosed just mild internal hemorrhoids, nothing  that looked engorged, excoriated or susceptible to bleeding.  No anal  fissure was appreciated.  No biopsies were obtained.  The patient  tolerated the procedure well and there were no apparent complications.   IMPRESSION:  1. No active bleeding or blood in the colonic lumen at the time of      this exam.  2. No prospective bleeding site seen, implying that the observed      rectal bleeding with defecation almost certainly is of rectal      outlet origin such as intermittently engorged internal hemorrhoids,      even though they were rather unimpressive in their appearance at      the present time.   PLAN:  Reassurance.  Bowel regimen with MiraLax to help maintain soft  stools since the rectal bleeding at least on this occasion was prompted  by passage of a hard firm stool.            ______________________________  Bernette Redbird, M.D.     RB/MEDQ  D:  06/28/2007  T:  06/28/2007  Job:  [id number]   cc:   Corwin Levins, MD  Shirley Friar, MD

## 2010-08-07 NOTE — Consult Note (Signed)
[name], [name] NO.:  [id number]   MEDICAL RECORD NO.:  [id number]          PATIENT TYPE:  INP   LOCATION:  1318                         FACILITY:  [facility]   PHYSICIAN:  Iva Boop, MD,FACGDATE OF BIRTH:  03/01/1956   DATE OF CONSULTATION:  10/01/2006  DATE OF DISCHARGE:                                 CONSULTATION   REQUESTING PHYSICIAN:  Jacinta Shoe, M.D.   PRIMARY CARE PHYSICIAN:  Oliver Barre, M.D.   REASON FOR CONSULTATION:  Diarrhea and colitis.   ASSESSMENT:  A 55 year old white man that was admitted with hypotension  and fever.  His urine culture shows greater than 100,000 colonies of  enterococcus.   His CT scan shows mild rectosigmoid colitis.  I think this man has  ischemic colitis related to his hypotension which was most likely a  direct result of this urinary tract infection.   RECOMMENDATIONS AND PLAN:  I would observe him.  He has had a little bit  of hematochezia, but he is on Coumadin for factor V Leiden deficiency.  Will reserve sigmoidoscopy or colonoscopy for persistent problems.  C.  difficile toxin is pending.  I doubt he has that, but it is still in the  differential diagnosis.   HISTORY:  This is a 55 year old white man with multiple medical problems  who was admitted with confusion, weakness, possible diarrhea and  abdominal pain, and some rectal bleeding.  He had a blood pressure of 70  on presentation to the emergency room with prostration and confusion and  abdominal pain.  He has had some incontinent stools that were loose and  some streaks of bright red blood.  Since his hospitalization that has  gotten better and he was able to get to the bedpan on his own, though he  had one incontinent episode streaked with some blood today.  His fever  has resolved.  He is on Cipro and Flagyl.  One C. difficile toxin is  negative.  CT of the abdomen and pelvis was performed and demonstrated  the rectosigmoid colitis.   He  has never had a colonoscopy, he tells me.   I tried to contact the family for further history and I could not obtain  that at this time.  They were not available.  Note, his INR was elevated  upon presentation at 3.9.   PAST MEDICAL HISTORY:  1. Fibromyalgia.  2. Obstructive sleep apnea and obesity.  3. Anxiety and depression.  4. Hypertension.  5. Recent pneumonia.  6. Factor V Leiden deficiency.  7. History of DVT.  8. Chronic Coumadin.  9. Seizure disorder.  10.C-spine degenerative disc disease.  11.Lumbar spine disease as well, deemed nonoperative candidate.  12.Gastroesophageal reflux disease.  13.History of peptic ulcer disease.  14.Prior appendectomy.   DRUG ALLERGIES:  To TOPAMAX.   HOME MEDICATIONS:  Coumadin, Elavil, Lamictal, Nexium, Paxil, Klonopin,  and Percocet.   HOSPITAL MEDICATIONS:  1. Klonopin 2 mg daily.  2. Warfarin as directed.  3. Paxil 40 mg daily.  4. Protonix 80 mg daily.  5. Lamictal 200 mg b.i.d.  6. Amitriptyline 50 mg daily.  7. IV Cipro 400 mg IV q.12h.  8. IV Flagyl 250 mg q.6h.  9. Klonopin 2 mg at bedtime.  10.Percocet one-and-a-half tablets 5/325 p.r.n.   SOCIAL HISTORY:  Smokes a half a pack per day, no alcohol.  Married,  lives with his wife.   FAMILY HISTORY:  See above.  The family does have a history of the  factor V Leiden deficiency.   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  He uses CPAP.  History of chronic renal insufficiency.   REVIEW OF SYSTEMS:  The patient does use a walker or a cane to ambulate.  He has difficulty walking due to previous neurologic injuries and his  pain problems and the fibromyalgia, it sounds like.   Note, I was able to speak to his wife, who said that he had really been  having intermittent confusion problems of unclear etiology.  This  started, and the weakness increase started prior to his diarrhea.  The  remainder of the review of systems appears negative.   PHYSICAL  EXAMINATION:  Reveals a chronically-ill white man in no acute  distress.  His speech is relatively clear but not completely  intelligible at all times.  The temperature is 97.8, pulse 79,  respirations 22, blood pressure 118/71.  EYES:  Anicteric.  NECK:  Supple.  CHEST:  Clear anteriorly.  HEART:  S1, S2.  No murmurs, rubs or gallops.  ABDOMEN:  Soft, minimally tender in the left lower quadrant in the groin  area and the deep right lower quadrant closer to the midline.  There is  no organomegaly or mass.  RECTAL:  Shows some mild tenderness without any stool in the vault.  Prostate is not examined today.  LOWER EXTREMITIES:  Free of edema.  SKIN:  Warm, dry, no acute rash.  As best I can, he is awake, alert, and  oriented x3.   LABORATORY DATA:  As described above.  Vitamin B12 level is normal.  Blood culture is negative so far.  Urine culture as above.  His sed rate  was 55.  His CMET shows a potassium of 3.4, glucose 103, and albumin  2.7, calcium 8.3, otherwise normal.  His pro time INR is 1.6.  C.  difficile toxin negative so far.  White count 6.3; platelets 177,000.  Original urinalysis had 3-6 white cells.  Pro time was 3.3 yesterday.  On July 7 it was 3.9.  On May 10 it was 3.1.   I have reviewed the CT reports as well.   I appreciate the opportunity to care for this patient.      Iva Boop, MD,FACG  Electronically Signed     CEG/MEDQ  D:  10/01/2006  T:  10/02/2006  Job:  [id number]   cc:   Corwin Levins, MD  [address]  [place]  [place] [address]
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
## 2010-08-07 NOTE — Consult Note (Signed)
NAME:  [name], [name] NO.:  [id number]   MEDICAL RECORD NO.:  [id number]          PATIENT TYPE:  INP   LOCATION:  6730                         FACILITY:  [facility]   PHYSICIAN:  Bernette Redbird, M.D.   DATE OF BIRTH:  November 06, 1955   DATE OF CONSULTATION:  06/27/2007  DATE OF DISCHARGE:                                 CONSULTATION   Dr. Alwyn Ren asked [place] to see this 55 year old Caucasian male who is  followed by Dr. Doy Mince in the office, because of abdominal pain  and rectal bleeding.   [name] was seen by [place] in consultation during a recent  hospitalization approximately 2 weeks ago when he was in the hospital  with small-volume hematochezia.  He was also occult heme-positive.   With that background, the patient is on chronic pain medications for  chronic low back pain and run somewhat constipated.  He had gone about 4  days without a bowel movement and then yesterday passed a large firm  stool, albeit without severe straining.  Associated with that was rather  profuse blood in the commode and on the toilet paper and he felt sweaty  and hot and then cold, and presented to the emergency room via  ambulance.  He also has been having some pain in the abdomen,  particularly in the right lower quadrant.   Today he had another softer bowel movement after use of laxatives, and I  believe there was a small amount of blood with that as well.   The patient underwent a endoscopy and colonoscopy by Dr. Claudette Head  in January of 2005, which were both negative except for some internal  hemorrhoids seen on the colonoscopy.  He last had a CT scan of the  abdomen during his last admission about 3 weeks ago which showed no  acute abnormalities but some fatty infiltration of the liver.   Note that the patient did not experience any perianal pain in  association with this bowel movement and has had blood only with bowel  movements, not spontaneously or apart from bowel  movements.  He has not  had significant abdominal pain, just some baseline abdominal discomfort.  In particular, there was no perianal pain during defecation as might be  expected with an anal fissure.   In addition, the patient points out that since his hospitalization he  was seen by a GI physician, presumably Dr. Bosie Clos, in our office  although I do not currently have records of that.  Apparently he was  advised that he could get by without updated endoscopic or colonoscopic  evaluation at that time.   PAST MEDICAL HISTORY:   ALLERGIES:  NO KNOWN ALLERGIES.   OUTPATIENT MEDICATIONS:  Oxycodone, fluoxetine, Nexium, Lamictal,  Elavil, Coumadin, Norvasc, clonazepam.   OPERATIONS:  Appendectomy.   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   HABITS:  Nonsmoker, nondrinker.   FAMILY HISTORY:  Negative for GI illnesses except for an uncle with  alcoholic cirrhosis.   SOCIAL HISTORY:  Married, lives at home, on disability.   REVIEW OF SYSTEMS:  See HPI.   PHYSICAL EXAM:  This is a stout, healthy-appearing gentleman in no acute  distress and appearing neither anxious nor depressed.  He is coherent  and appropriate.  He is anicteric and without evident pallor.  The chest is clear.  The heart is normal.  The abdomen is obese and tympanitic in the epigastric area.  A little  bit of subjective tenderness in the right mid and lower abdominal region  without peritoneal findings or objective tenderness, guarding or mass  effect.   LABS:  White count 4400, hemoglobin 13.0, MCV 83, platelets 228,000.  INR 2.5 on Coumadin (was 3 when he came in).  Liver chemistries  completely within normal  limits.  Albumin low at 3.4.  Amylase and  lipase normal.  Urinalysis clear.  Occult blood testing on the stool on  admission was positive.   IMPRESSION:  1. Painless small-volume hematochezia without anemia, and a pattern      very suggestive of hemorrhoidal bleeding.  Note that it occurred      while passing a hard stool.  2. Occult blood in the stool on prior admission, possibly due to      contamination from rectal bleeding.  Also occult positive on this      admission, almost certainly contaminated by rectal bleeding since      that is what he presented with.  3. Nonspecific abdominal pain.  4. Constipation, presumably related to outpatient use of narcotic pain      medication and relative lack of mobility. 5.  Evidence for fat      deposition within the liver based on CT findings, but without      elevated liver chemistries or radiographic evidence of advanced      liver disease.  5. Factor V Leiden deficiency with hypercoagulable state requiring      chronic anticoagulation which might potentiate rectal bleeding when      it occurs.   DISCUSSION:  Almost certainly the patient's bleeding is hemorrhoidal in  origin.  He is clinically stable.  There was no associated abdominal  pain, no associated diarrhea.  This does not sound like rectal cancer,  ischemic colitis, inflammatory bowel disease or for that matter, even an  anal fissure.   RECOMMENDATIONS:  1. I offered the patient the option of a limited sigmoidoscopic      evaluation while in the hospital to help satisfy ourselves that      there is no significant pathologic process occurring distally such      as a neoplasm or inflammation.  He would like to have this done.  2. That not withstanding, I feel the patient should be reconsidered      for possible full colonoscopic evaluation at the discretion of his      primary gastroenterologist, Dr. Doy Mince.  3. The patient needs chronic laxation in view of his chronic  narcotic      therapy.  MiraLax would probably be the best option for this, in a      dose titrated to achieve 1 or 2 soft bowel movements a day.   We appreciate the opportunity to have seen this patient in consultation  with you.  ______________________________  Bernette Redbird, M.D.     RB/MEDQ  D:  06/27/2007  T:  06/27/2007  Job:  [id number]   cc:   Corwin Levins, MD  Shirley Friar, MD
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
## 2010-08-07 NOTE — H&P (Signed)
NAMEJABARI, [name]            ACCOUNT NO.:  [id number]   MEDICAL RECORD NO.:  [id number]          PATIENT TYPE:  INP   LOCATION:  3036                         FACILITY:  [facility]   PHYSICIAN:  Sean A. Everardo All, MD    DATE OF BIRTH:  [birth year]   DATE OF ADMISSION:  02/19/2007  DATE OF DISCHARGE:                              HISTORY & PHYSICAL   REASON FOR ADMISSION:  Question of seizure.   HISTORY OF PRESENT ILLNESS:  A 55 year old man who had an episode today  witnessed by his wife.  She states that he developed staring from left  to right followed by clonic activity symmetrically  of both upper  extremities.  He then developed slurred speech and disorientation and  then his both lower extremities began shaking but in the case of the  lower extremities it appeared to be more tonic and less clonic.  They  called 911 and the episode resolved without any intervention on their  part.  He is now resting on a stretcher comfortably.   PAST MEDICAL HISTORY:  Please refer to his admission on January 17, 2007.   SOCIAL HISTORY:  He is disabled.  He lives at home with this wife.   REVIEW OF SYSTEMS:  No loss of consciousness.  No fever, no vomiting, no  chest pain, no shortness of breath but his wife does say he was slightly  diaphoretic.   PHYSICAL EXAMINATION:  VITAL SIGNS:  Blood pressure 118/75, heart rate  90, respiratory rate 16, temperature 97.7.  GENERAL:  He is drowsy but cooperative.  SKIN:  No rash, not diaphoretic.  HEENT:  No evidence of head trauma to external examination.  Pharynx is  normal.  NECK:  Supple.  CHEST:  Clear to auscultation.  CARDIOVASCULAR:  No edema.  Regular rate and rhythm.  No murmur. Pedal  pulses are intact.  Regular rate and rhythm.  No murmur pedal pulses are  intact.  ABDOMEN:  Soft, obese, nontender.  No hepatosplenomegaly.  No mass.  GENITOURINARY:  Not done at this time.  RECTAL:  Not done at this time due to patient condition.  EXTREMITIES:  No deformity is seen.  NEUROLOGIC:  He is cooperative but drowsy.  Cranial nerves II-XII are  grossly intact bilaterally.  Motor strength on all 4s is 4/5 and  symmetrical.   LABORATORY DATA:  Sodium, potassium, chloride, BUN, glucose, hemoglobin,  hematocrit, and pH are all normal. INR is 3.0.   IMPRESSION:  1. Seizures versus pseudoseizure.  2. History of deep vein thrombosis.  3. Other chronic medical problems noted above.   PLAN:  1. I discussed this on the phone with Dr. Manfred Shirts from neurology. He      will be admitted and observed on his current therapy and they will      see him in consultation tomorrow morning.  2. Continue Coumadin.      Sean A. Everardo All, MD  Electronically Signed     SAE/MEDQ  D:  02/19/2007  T:  02/20/2007  Job:  [id number]

## 2010-08-07 NOTE — Procedures (Signed)
EEG NUMBER:  [id number].   This a routine EEG.  The patient is currently on Nexium, Lamictal,  OxyContin, warfarin, amlodipine, Paxil, amitriptyline, clonazepam,  Elavil, Klonopin, Percocet, Coumadin, and Dilaudid.   CLINICAL INFORMATION:  This 55 year old white male has a history of  falls with lower extremity weakness and is being evaluate for blackouts  and to rule out seizure activity.   TECHNICAL DESCRIPTION:  This EEG was recorded during the awake state.  The background activity shows 9 and 10 Hz rhythms, with some  superimposed low-voltage fast beta activity.  No evidence of any focal  asymmetries present during this EEG.  No stage II sleep was noted during  this EEG.  Photic stimulation was performed, which did not produce a  driving response in the occipital head regions.  Hyperventilation  testing was not performed.   IMPRESSION:  This is a normal EEG during the awake state, with some much  low-voltage fast beta activity, suggestive of medication effect.           ______________________________  Genene Churn. Sandria Manly, M.D.     ZOX:WRUE  D:  11/04/2006 10:21:29  T:  11/05/2006 11:04:06  Job #:  [id number]
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
## 2010-08-07 NOTE — Discharge Summary (Signed)
[name] NO.:  [id number]   MEDICAL RECORD NO.:  [id number]          PATIENT TYPE:  INP   LOCATION:  1518                         FACILITY:  [facility]   PHYSICIAN:  Austin Dubin. Hopper, MD,FACP,FCCPDATE OF BIRTH:  1955-08-24   DATE OF ADMISSION:  04/03/2007  DATE OF DISCHARGE:  04/05/2007                               DISCHARGE SUMMARY   ADMISSION DIAGNOSES:  1. Confusion and lethargy with subjective fever, cough, rule out      community acquired pneumonia.  2. History of seizure disorder.  3. Chronic pain.  4. Factor 5 Leiden deficiency.   HISTORY OF PRESENT ILLNESS:  Please see the dictation of April 03, 2007.   DISCHARGE DIAGNOSES:  1. Lethargy with mild stable anemia.  2. Subjective cough with suboptimal inspiratory effort on chest x-ray      with subsequent bilateral atelectasis.   HOSPITAL COURSE:  The patient was admitted and placed on Avelox IV. His  temperature remained within normal limits throughout the  hospitalization. O2 saturations were at low as 92% on 2 liters on  April 04, 2007. By the time of discharge, his O2 saturations were 95%  on room air. His initial white count was 7,500. Repeat white count was  6,700. He did exhibit a mild anemia, which was stable. Specifically, his  hematocrit was 36.4 on followup; initially, it was 34.9. INR was  initially 3.5; on the day of discharge, it was 3.0.   On April 04, 2007, he complained of sore throat and profound weakness  with back pain. He went on to describe some soreness in the lower  abdomen. There were no localizing signs on physical examination. Abdomen  was non-tender. He would drift off to sleep during the examination. He  was in no acute distress.   A beta strep was negative and thyroid fu8nctions were normal. As stated,  anemia did not progress.   The morning of discharge, he was resistant to discharge, stating that he  had had some confusion about holding an item. He  thought he was holding  a cup but when he woke up, he was not. He also stated that he was  concerned about his overly  active bowel sounds. Abdomen revealed normal  bowel sounds and there was no distention or tenderness to palpation.   His affect was markedly flat and depressed.   He stated that he was concerned about a cough but the 2 days I made  rounds, there was no evidence of such.   He was discharged no his home medications. He was given Phenergan with  Codeine for cough. During the hospitalization, his Clonazepam was held  because of somnolence.   I have asked him to see Dr. Jonny [name] on April 08, 2007 and have a  prothrombin time checked. Because his white count has not been elevated  and he has no fever, he will not be discharged on antibiotics.   If he is not participating in a chronic pain clinic, it is recommended  that such be pursued. The history and physical describes  a history of  personality disorder. It is  recommended that his anxiety, depression,  and personality disorder be followed by psychiatric specialist.   Despite the normal white count, absence of fever, and good O2  saturations, he continued to be resistant to discharge. He stated that  he was having some gait problems. Further history revealed that this is  a chronic problem for which he is followed by [name]. When seen by  [name], decision can be made whether to refer him to Dr.  Thad [name] for additional followup.   During his hospitalization, he exhibited no distress at any time and  essentially lay in the bed with minimal interaction. The major issue  here may be his depression with underlying anxiety and a more intensive  psychiatric evaluation may be appropriate. This will be deferred to Dr.  Jonny [name].   As I informed him, there was no indication for him to remain in the  acute bed hospital but he could return, should there be any acute change  in his symptoms. It was difficult to define  an active ongoing process,  as he had multiple non-localizing symptoms without definite objective  evidence of an acute process.      Austin Dubin. Alwyn Ren, MD,FACP,FCCP  Electronically Signed     WFH/MEDQ  D:  04/05/2007  T:  04/05/2007  Job:  [id number]   cc:   Austin Levins, MD  [address]  [place]  [place] [address]   [name], M.D.  Fax: [phone]
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
## 2010-08-07 NOTE — H&P (Signed)
NAMECHANDLAR, [name]            ACCOUNT NO.:  [id number]   MEDICAL RECORD NO.:  [id number]          PATIENT TYPE:  INP   LOCATION:  5029                         FACILITY:  [facility]   PHYSICIAN:  Austin Gess. Norins, MD  DATE OF BIRTH:  Oct 06, 1955   DATE OF ADMISSION:  11/03/2006  DATE OF DISCHARGE:                              HISTORY & PHYSICAL   CHIEF COMPLAINT:  Can't walk.   HISTORY OF PRESENT ILLNESS:  [name] is well known to the practice  with multiple hospital admissions, most recently July 7 to October 07, 2006, for enterococcal UTI.   The patient reports he has had many months of lower extremity weakness  was trembling and multiple falls.  He was previously evaluated for this  in May 2008.  The patient also with a litany of chronic complaints  including headache and insomnia.  Per the patient's family, the patient  appears to have blackout-like spells when he stands and tries to walk.  He develops trembling of his leg, instability, and then he will fall.  For these reasons, he presents to hospital for evaluation and admission  with the patient's family feeling he is unsafe to be at home because of  his multiple falls.   PAST MEDICAL HISTORY:  1. Colitis.  2. Encephalopathy with acute illness  3. Hypercoagulable disorder.  He has protein C and protein S      deficiencies as well as Factor V Leiden deficiency.  4. Fibromyalgia.  5. OSA.  6. Anxiety and depression.  7. Hypertension.  8. DVT.  9. Seizure disorder.  10.Cervical spine disease.  11.Lumbar spine disease with disk bulging  12.GERD.  13.Peptic ulcer disease by history.  14.Fibromyalgia.   SURGICAL HISTORY:  Appendectomy.   CURRENT MEDICATIONS:  1. Norvasc 5 mg daily.  2. Coumadin as directed.  3. Elavil 100 mg 1/2 tablet nightly.  4. Lamictal 200 mg b.i.d.  5. Nexium 40 mg daily.  6. Paxil 40 mg daily.  7. Klonopin 1 mg nightly.  8. Percocet p.r.n.   DRUG ALLERGIES:  TOPAMAX.   SOCIAL  HISTORY:  The patient is living at home. His wife is supportive.  He has been stable.   FAMILY HISTORY:  Noncontributory.   PHYSICAL EXAMINATION:  VITAL SIGNS:  Temperature 97.6, blood pressure  98/60, heart rate 88, respirations 16, O2 saturation 97%.  GENERAL APPEARANCE:  A heavy-set white male in no acute distress.  HEENT:  Normocephalic, atraumatic.  Right eye sutured closed. Left  conjunctiva and sclera were clear was clear.  Left pupil is equal,  round, reactive.  There are no oral lesions.  NECK:  Supple with poor range of motion. Thyroid was normal and easily  palpable.  NODES:  No adenopathy was noted in the submandibular, cervical,  supraclavicular, or axillary regions.  CHEST:  No CVA tenderness.  No deformities noted.  LUNGS:  Clear to auscultation and percussion.  CARDIOVASCULAR:  2+ radial pulse, 2+ dorsalis pedis pulse. The patient  had no JVD, no carotid bruits.  Precordium was quiet.  He had a regular  rate and rhythm without murmurs, rubs  or gallops.  ABDOMEN:  Bowel sounds were positive.  Abdomen is protuberant.  He had  no guarding or rebound.  He had no organosplenomegaly.  RECTAL/GENITALIA:  Exams deferred.  EXTREMITIES:  No clubbing, cyanosis or deformity is noted.  NEUROLOGIC:  Patient is alert, oriented to person, place, time, and  context, and he is a good historian.  Cranial nerves II-XII:  Normal  facial symmetry was noted.  Left eye with extraocular muscle intact.  Pupil was reacted. Vision not tested. No deviation of tongue or uvula.  DTRs are 2+ at radial tendon, 2+ at the patellar tendon.  Motor  strength:  The patient was able to move his upper extremities but  complained of discomfort secondary to fibromyalgia. Lower extremities:  The patient with coaching could lift his legs off the table about 4  inches but was unable offer much resistance to counterforce.  The  patient could keep his leg straight, strength of 5/5 with coaching to  bend his knee  and assistance once knee is bent, but he can keep it bent  with 5/5 proximal strength.  Cerebellar function:  The patient had no  resting tremor.  He had some muscular rigidity and tremor which is  possibly artifactual.  Toes were downgoing.   DATABASE:  UA was negative.  Sodium 139, potassium 3.3, chloride 101,  CO2 32, BUN 15, creatinine 1.3, glucose 104.   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   MRI of the lumbar spine Jul 30, 2006, with a bulging disk L4-L5, bulging  L5-S1 with L5 nerve contact.   CT of brain September 29, 2006, with no active disease and a 5 x 10 mm lipoma  that is stable.   ASSESSMENT/PLAN:  1. Neurologic.  Report of blackout spells.  He complains of lower      extremity weakness which hinders his gait and causes falls.      Imaging studies as above.  The patient was seen and evaluated for a      very similar problem in May 2008, seen by [name] from      neurology, [name] for neurosurgery.  No definitive diagnosis      was made. At that admission, the patient was able to ambulate and,      after PT and OT evaluation, was able to be discharged.  The patient      does have a history of seizure disorder but no witnessed seizures      prior to this admission.  Plan:  The patient will be admitted to a      regular bed.  Will continue his home medications.  Will evaluate      Lamictal as a possible cause of tremor and weakness.  We will      obtain PT and OT evaluation.  Will get an EEG.  Will try to arrange      for EMG nerve conduction studies in hospital, and if not available      as an outpatient.  2. Fibromyalgia, stable.  Will continue home medications.  3. Obstructive sleep apnea.  We will have the patient report his      settings and will provide sleep apnea support in hospital.  4. Hypertension, stable.  5. Coagulopathy. The patient will be continued on Coumadin.   Of note,      his INR will be checked. At  this time we will continue Coumadin 5      mg daily.  6. Gastrointestinal with history of peptic ulcer disease and colitis      with no active complaints.  Plan will be to continue the patient on      proton pump inhibitor.   This certainly is a complicated patient with a complex medical history,  now with lower extremity weakness and falls.      Austin Gess Norins, MD  Electronically Signed     MEN/MEDQ  D:  11/03/2006  T:  11/04/2006  Job:  [id number]
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
## 2010-08-07 NOTE — H&P (Signed)
[name], [name]            ACCOUNT NO.:  [id number]   MEDICAL RECORD NO.:  [id number]          PATIENT TYPE:  OBV   LOCATION:  1429                         FACILITY:  [facility]   PHYSICIAN:  Hollice Espy, M.D.DATE OF BIRTH:  1955-09-22   DATE OF ADMISSION:  05/14/2007  DATE OF DISCHARGE:  05/15/2007                              HISTORY & PHYSICAL   ATTENDING PHYSICIAN:  Hollice Espy, M.D.   PRIMARY CARE PHYSICIAN:  Corwin Levins, MD.   CHIEF COMPLAINT:  Multiple complaints, including shortness of breath,  double vision and frequent falls and seizure.   HISTORY OF PRESENT ILLNESS:  The patient is a 55 year old white male,  past medical history of seizure disorder, recent skin MRSA infection and  personality disorder who presented to the emergency room after  complaints that in the last few days he has been having frequent falls,  seeing floaters, and had a  seizure earlier on today.  The patient was  also complaining to the ER attending of some tightness in his chest,  shortness of breath, and cough as well.  After the ER doctor was able to  sort out his various complaints and evaluate the patient, the patient  told the ER doctor that the floaters, the falls and the possible seizure  have happened in the past when his medication, Lamictal, for seizures  has a level that is either too high or too low.  A level was sent off  but because it is a send-out lab will not be available until the  morning.  In the meantime, the patient underwent an EKG, chest x-ray,  cardiac markers and other screening labs, all of which were found be  completely unremarkable.  [facility] were called for further  evaluation.  Currently the patient is doing okay.  He still complains of  some floaters.  When he stares at the clock he feels like it is moving.  He denies any headaches.  No dysphagia.  He is no longer having any  chest pain or tightness.  He says his shortness of breath  is better, but  he continues to have a cough which is nonproductive.  He says this has  been going on for some time.  He denies any abdominal pain.  No  hematuria or dysuria, no constipation or diarrhea.  He denies any focal  extremity numbness, weakness or pain but says he feels unsteady on his  feet and is worried about falling.  In regards to his seizure, he said  it happened earlier, he is not exactly sure what happened, but he was  shaking and his wife told him that he had had a seizure.   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   MEDICATIONS:  Nexium 40, Lamictal 400 p.o. b.i.d., Coumadin 5, Elavil  100 q.h.s., Ultram 200 q.h.s., Klonopin 2 q.h.s., Percocet 5/325 p.r.n.,  doxycycline 100 mg p.o. q.12h., and Cymbalta.   ALLERGIES:  He has  no known drug allergies.   SOCIAL HISTORY:  No current tobacco, alcohol or drug use.   FAMILY HISTORY:  Noncontributory.   PHYSICAL EXAMINATION:  VITAL SIGNS ON ADMISSION:  Temperature 97.6,  heart rate 100, now down to 86, blood pressure 131/84, respirations 22,  O2 sat 99% on room air.  GENERAL:  He is alert and oriented x 3, in no apparent distress.  HEENT:  Normocephalic atraumatic.  His mucous membranes are moist.  NECK:  He has no carotid bruits.  HEART:  Regular rate and rhythm, S1-S2.  LUNGS:  Clear to auscultation bilaterally.  ABDOMEN:  Soft, obese, nontender.  Positive bowel sounds.  EXTREMITIES:  No clubbing or cyanosis.  Trace pitting edema.   LABORATORY DATA:  EKG is normal sinus rhythm.  Chest x-ray is  unremarkable.  Lab work:  White count 6.4, H&H 13.6 and 40, MCV 82,  platelet count 232, no shift.  Sodium 139, potassium 4.1, chloride 104,  bicarb 30, BUN 11, creatinine 1.05, glucose 90.  LFTs are unremarkable.  Lipase level 19,   normal.  INR is therapeutic at 2.4.  Coags  unremarkable.  D-dimer is within normal limits at 0.28.  Cardiac markers  unremarkable.   ASSESSMENT/PLAN:  1. Frequent falls the last few days.  Complains of floaters and      question unwitnessed seizure, possible subtherapeutic versus      supratherapeutic Lamictal level versus anxiety versus a conversion      reaction.  We will observe overnight and await Lamictal level.      Walk with assistance.  If  his Lamictal level is normal then likely      he can be discharged home tomorrow.  2. Chest pain, shortness of breath, cough.  Negative enzymes.  White      count within normal limits.  EKG, chest x-ray unremarkable.  I      suspect this is anxiety or perhaps a personality disorder.  We will      plan to observe on telemetry.  3. Personality disorder.  4. History of seizure disorder.  See above.  5. Methicillin-resistant Staphylococcus aureus infection on his back.      Continue doxycycline.      Hollice Espy, M.D.  Electronically Signed     SKK/MEDQ  D:  05/14/2007  T:  05/15/2007  Job:  [id number]   cc:   Corwin Levins, MD  [address]  [place]  [place] [address]

## 2010-08-07 NOTE — Op Note (Signed)
[name], [name] NO.:  [id number]   MEDICAL RECORD NO.:  [id number]          PATIENT TYPE:  OIB   LOCATION:  5002                         FACILITY:  [facility]   PHYSICIAN:  Alvy Beal, MD    DATE OF BIRTH:  [birth year]   DATE OF PROCEDURE:  DATE OF DISCHARGE:                               OPERATIVE REPORT   PREOPERATIVE DIAGNOSIS:  Chronic pain.   POSTOPERATIVE DIAGNOSIS:  Chronic pain.   OPERATIVE PROCEDURE:  Permanent implantation of spinal cord stimulator.   INSTRUMENTATION USED:  ACS spinal cord stimulator placed from the  midportion at T8 to the inferior portion at T9, similar to the trial  placement with a left-sided battery as preferred by the patient.   COMPLICATIONS:  None.   HISTORY:  This is a very pleasant 55 year old gentleman who has had  chronic lower extremity pain for sometime now.  Attempts at conservative  management failed to control his chronic pain, and so a trial of spinal  cord stimulator was implanted.  The patient did very well, he had  significant relief of his symptoms and also in functional improvement.  At that point, the decision was made to convert the trial to a permanent  implant.  All appropriate risks, benefits, and alternatives to surgery  were discussed and consent was obtained.   OPERATIVE NOTE:  The patient was brought to the operating room, placed  supine on the operating table.  After successful induction of general  anesthesia and endotracheal intubation, TED, SCDs were applied.  He was  turned prone onto a Wilson frame.  All bony prominences were well  padded, the arms were placed overhead, and that thoracolumbar spine was  prepped and draped in a standard fashion.   The x-ray fluoro was then used in a lateral plane to count from the L5  vertebral body up to the T10 vertebral body.  Once I confirmed the T10  body, I then made an incision centered about the T10 spinous process.  This was about 4 inches in  length.  Sharp dissection was carried out  down to the deep fascia.  Deep fascia was sharply incised, and using a  Cobb elevator, I stripped the paraspinal muscles to expose the T10  spinous process and lamina.  Self-retaining retractors were placed and a  Penfield #4 was placed underneath the T10 lamina.  I then went back, and  using an 18-gauge needles through percutaneous stab incisions, I then  counted back up from L5 to ensure that I was at the T10 lamina.  Once I  confirmed this place, I then used a Leksell rongeur to resect the entire  spinous process at T10.  I then performed a laminotomy of T10 and then  packed the exposed bone edges with bone wax to prevent any excessive  bleeding.  I then developed a plane between the ligamentum flavum and  the underlying dura and resected the ligamentum flavum to expose the  dura.  Great care was taken to protect the dura not to compress it  during the excision of the ligamentum flavum.  I then using  a hockey-  stick dural elevator developed a plane superiorly between the dura and  overlying lamina.  I then passed the dural elevator superiorly and then  I obtained a standard size spinal cord paddle, dipped it in mineral oil  and then passed it up to the midportion of the T vertebral body.  At  this point, I had adequate positioning of the paddle.  I then took the  plastic guards, placed them over the leads, and sutured it directly to  the T11 spinous process with a #2 Ethibond suture.  I then connected it  to the battery and then tested the lead to ensure that it was  functioning properly.  Once I had assured myself that it was function  properly, I then made a counterincision at the battery site and sharply  dissected down 2.5 cm.  Once I had the appropriate depth, I made a  pocket and then I took the passer and passed it from the thoracic  incision, submuscular into the battery incision on the left gluteal  region.  I then passed the leads  through the passer.  I then looped them  at the thoracic level site, so that there was no undue pressure and then  made a second tie to secure it down to the fascia.  At this point, I had  adequate fixation of the wires so that it would not migrate.  I then  irrigated the thoracic wound copiously with normal saline, closed the  deep fascia with interrupted #1 Vicryl sutures, superficial with 2-0  Vicryl sutures, and a 3-0 Monocryl for the skin.  Steri-Strips were  applied.  At this point, I then turned my attention to the battery.  With the wires positioned, I then secured it to the battery and tested  it one last time.  Once I had adequate test, I then placed the battery  into a contoured pocket 2.5 cm in depth and then stitched it to the deep  fascia with #1 Vicryl sutures.  At this point, I had adequate fixation.  I closed the subcutaneous tissue with 2-0 interrupted Vicryl sutures and  then 3-0 Monocryl for the skin.  All wounds were irrigated copiously  with normal saline prior to closure.  Hemostasis was also obtained using  the bipolar electrocautery and FloSeal prior to the closure.  Steri-  Strips, dry dressing were applied.  The patient was extubated and  transferred to the PACU without incident.  At the end of the case, all  needle, instrument, and sponge counts were correct.      Alvy Beal, MD  Electronically Signed     DDB/MEDQ  D:  02/10/2008  T:  02/11/2008  Job:  [id number]
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
## 2010-08-07 NOTE — Consult Note (Signed)
NAMEMarland Kitchen  TIMON, GEISSINGER NO.:  [id number]   MEDICAL RECORD NO.:  [id number]          PATIENT TYPE:  EMS   LOCATION:  MAJO                         FACILITY:  [facility]   PHYSICIAN:  Hollice Espy, M.D.DATE OF BIRTH:  Apr 12, 1955   DATE OF CONSULTATION:  03/26/2007  DATE OF DISCHARGE:  03/26/2007                                 CONSULTATION   PRIMARY CARE PHYSICIAN:  Corwin Levins, M.D.   NEUROLOGIST:  Marolyn Hammock. Thad Ranger, M.D.   CHIEF COMPLAINT:  Reported confusions.   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 At that time he had a reported  seizure, was brought into the hospital.  Had no seizures during his  hospitalization.  Was evaluated by neurology.  On review of his medical  records, he has had extensive ER encounters for seizures as well as a  number of evaluations by neurology for the seizures, all of which have  never found any organic findings.  According to the patient's wife, he  has been put on Lamictal and most recently Lyrica.  However, his Lyrica  ran out and for the past two weeks, he has not been on Lyrica.  According to his wife, he has actually done better not being on the  Lyrica.  Says that his gait has been strong and he has had no further  seizures.  He did not intentionally stop taking the medication, but had  run out of medication and had not had a chance to refill it.  The  patient refilled the Lyrica and had started back on his medicine two  days ago.  Actually he had been initial fine but then today he had a  seizure at home.  When asked to describe the seizure, he said that she  found him in his  living room.  He was standing up. His hands were  holding the TV and he was beating his head repeatedly on topo of the TV  which she said was the seizure.  He then fell backwards, landing on his  back and paramedics were called and the patient was brought into the  emergency room.  Initially he the paramedics report that the patient had  not responded verbally to them.  However, when you touched his eyes or  lashes, he would flinch his eyes as well.  When the patient was brought  into the emergency room, this history was provided by the patient's wife  as the patient says he is not able to recall what happened when he had  the seizure.  The wife then said that the patient had a second seizure  while in the emergency room. This one, however, the patient said he was  able to remember and that he said that was shaking all over.  Please  note that on arrival, the patient's blood pressure was stable at 124/79  with respirations of 22 and this has not changed very much.  The patient  had lab work done and had a normal white count with no shift, normal  hemoglobin, normal electrolytes.  His INR was found to be slightly  supratherapeutic at 3.5.  An ABG was done when the patient was getting  full oxygen and his pO2 was markedly elevated at 317.  Since then he has  been able to be weaned down to 2 L nasal cannula.  A CT scan of the head  was done which showed no acute intracranial abnormalities, chronic small  vessel disease, and chronic sinusitis.  A CT scan of his C-spine was  done  to rule out any trauma which  showed no evidence of any acute bony  abnormalities.  Chest x-ray showed some low lung volumes with  cardiomegaly but this was done secondary to the patient's portable film  and poor inspiratory effort.  Clearly the patient tells me himself that  he is feeling very weak.  His speech is somewhat slurred; however, the  wife says this is new.  However, when the patient is distracted, and  was  asked about his primary care physician which he says he does have a good  relationship, his speech  seemed to improve. He tells me that he is  complaining of some soreness in his neck but otherwise seems to be doing  well.  He says he feels very fatigued.  He complains of a mild headache  and complains of problems with his vision. Mostly, however, this is in  his right eye where he has a ptosis which has been previously present.  He denies any dysphasia.  No chest pain or palpitations. No shortness of  breath, wheezing or coughing. He denies any abdominal pain.  He  complains more of a generalized weakness  rather than a focal numbness,  weakness or pain.  Review of systems is otherwise negative.  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  These were recurrent spells and was felt  to have no organic findings previously.  He also has history of DVT and  Factor V Leiden deficiency, depression, anxiety, questionable  personality disorder, gait disorder, chronic low back and associated  sleep apnea, hypertension, and fibromyalgia.   MEDICATIONS:  Based on his last discharge summary,  1. Coumadin 5.  2. Lamictal 200 mg b.i.d.  3. Norvasc 5 mg.  4. Klonopin 1 mg.  5. Paxil 40 mg.  6. Lyrica 75 mg t.i.d.  7. Elavil 50 mg at bedtime.   ALLERGIES:  TOPAMAX.   SOCIAL HISTORY:  No current tobacco, alcohol or drug uses.  He lives at  home with his wife.   FAMILY HISTORY:  Noncontributory.   PHYSICAL EXAMINATION:  VITAL SIGNS:  On admission, temperature 97.3,  heart rate 100, blood pressure 124/79, respirations 22, oxygen  saturation 100% on 2 L.  GENERAL:  In general, the patient appears to be alert and oriented.  HEENT: He has a right ptosis.  Normocephalic.  He has signs of some  bruising with some superficial bleeding as well as a nosebleed with  dried blood on his face.  His cranial nerves appear to be intact  although it is very difficult to check his ocular exam as again this is   more subjective.  Even when asked to put out his tongue, he appears to  be reluctant to  do so, but is less reluctant when distracted.  HEART:  Regular rate and rhythm.  S1 and S2.  LUNGS:  Decreased breath sounds throughout secondary to body habitus and  not much inspiratory effort.  Again more subjective.  ABDOMEN:  Soft, nontender, nondistended.  Positive bowel sounds.  EXTREMITIES:  No clubbing or cyanosis.  Trace pitting edema.  NEUROLOGIC/MUSCULOSKELETAL:  Unremarkable for objective.  For subjective  findings, he appears to again show some problems which are inconsistent.  For example, when the nurses report that he initially kept his eyes  closed.  However, when they tried to brush his eyelashes and they would  get close, that is when he would close his eyes tightly.  In addition,  when I tried to test his strength and asking him to leave his hand up in  the air, it would fall suggesting not able to have strength against  gravity.  However, when the patient was distracted when talking about  his PCB, he appeared to have full hand strength and prevented the hand  from falling on him.  He has no signs of negative Babinski sign and  again I found no focal neurological finding.   LABORATORY DATA:  CT scan of the head and spine is as per HPI.  White  count 5.8, hemoglobin 13.4, hematocrit 41, MCV 85, platelet count 245,  no shift.  Sodium 138, potassium 3.5, chloride 106, bicarb 27, BUN 16,  creatinine 1.19, glucose 140.  LFTs unremarkable.  INR supratherapeutic  at 3.5.  pH 7.36, pCO2 50, pO2 317, bicarb 29.  I have ordered a CPK  level total which is pending.   ASSESSMENT/PLAN:  1. Convulsion episode. 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  Once again I      am finding no organic reason and the exam is very inconsistent.      Rather than confront the patient, I have explained that there is no      physical reason to keep him without being hostile.  I told him that      the best thing to do  would be to follow up with Dr. Thad Ranger as an      outpatient in the interim.  2. Factor V Leiden deficiency.  INR is slightly supratherapeutic.      Advised to decrease dose for one day and resume previous the next      following day.      Hollice Espy, M.D.  Electronically Signed     SKK/MEDQ  D:  03/26/2007  T:  03/27/2007  Job:  [id number]   cc:   Corwin Levins, MD  Marolyn Hammock. Thad Ranger, M.D.

## 2010-08-07 NOTE — Discharge Summary (Signed)
NAMERANON, COVEN NO.:  [id number]   MEDICAL RECORD NO.:  [id number]          PATIENT TYPE:  INP   LOCATION:  1318                         FACILITY:  [facility]   PHYSICIAN:  Corwin Levins, MD      DATE OF BIRTH:  [birth year]   DATE OF ADMISSION:  09/29/2006  DATE OF DISCHARGE:  10/07/2006                               DISCHARGE SUMMARY   DISCHARGE DIAGNOSES:  1. Enterococcal urinary tract infection.  2. Colitis.  3. Encephalopathy secondary to above.  4. History of hypercoagulable disorder.  5. Fibromyalgia.  6. Obstructive sleep apnea.  7. Anxiety/depression.  8. Hypertension.  9. History of recent pneumonia.  10.History of deep venous thrombosis.  11.Seizure disorder.  12.Cervical spine disease.  13.Gastroesophageal reflux disease.  14.History of peptic ulcer disease.  15.Status post appendectomy.  16.Elevated INR on admission.  17.Hypokalemia.   PROCEDURE:  Flexible sigmoidoscopy on October 03, 2006 with normal  examination.   CONSULTS:  Gastroenterology, Dr. Leone Payor.   HISTORY AND PHYSICAL:  See that dictated by myself on September 29, 2006.   HOSPITAL COURSE:  Mr. Daudelin is a 55 year old white male with multiple  medical problems, who presented with confusion, Enterococcal UTI,  symptomatic and bloody diarrhea with hypotension.  He was treated in the  usual fashion with IV antibiotics, IV fluids, and multiple cultures  which proved positive for the enterococcal UTI.  With this treatment, he  gradually improved as well as replacement of the potassium.  He was seen  and was initially thought to have ischemic colitis by gastroenterology  because of the above.  A flexible sigmoidoscopy proved within normal  limits.  It did gradually resolve, as far as his encephalopathy.  Potassium resolved.  Diarrheal illness resolved as well.  C. diff toxin  negative x3.  He was changed to p.o. Cipro and Flagyl, to which he did  well as well.  He has been ambulatory and  eating well for the last day  in the hospital and was felt to have gained maximum benefit from this  hospitalization.  He was also begun on Norvasc for hypertension, to  which he tolerated well.   As he was ambulatory and eating well, it was felt he had gained maximal  benefit from this hospitalization and is to be discharged home.  Also of  note, because of the blood diarrhea and elevated INRs, Coumadin was held  for a short time and then restarted shortly thereafter.  INR at the time  of discharge was 2.1.  He is to have 7.5 mg today, DC the Lovenox, and  follow up with the Coumadin clinic.   DISPOSITION:  Discharge to home in good condition.  There are no  activity or dietary restrictions, except for a low sodium, low  cholesterol diet.  He is to follow up with the Coumadin clinic in 10  days.  Follow up with myself as primary care physician in 7-10 days.  He  will have home physical therapy as well as nursing support at home.   DISCHARGE MEDICATIONS:  1. Norvasc 5 mg p.o. daily.  2. Coumadin  as directed.  3. Ciprofloxacin 500 mg p.o. b.i.d. x5 days.  4. Flagyl 250 mg p.o. q.i.d. for 5 days.  5. Home medications to include that as on admission.  6. Elavil 100 mg 1/2 p.o. daily.  7. Lamictal 200 mg b.i.d.  8. Nexium 40 mg p.o. daily.  9. Paxil 40 mg p.o. daily.  10.Klonopin 1 mg __________ nightly.  11.Percocet p.r.n.      Corwin Levins, MD  Electronically Signed     JWJ/MEDQ  D:  10/07/2006  T:  10/07/2006  Job:  [id number]

## 2010-08-07 NOTE — Discharge Summary (Signed)
[name], [name]            ACCOUNT NO.:  [id number]   MEDICAL RECORD NO.:  [id number]          PATIENT TYPE:  INP   LOCATION:  6730                         FACILITY:  [facility]   PHYSICIAN:  Valerie A. Felicity Coyer, MDDATE OF BIRTH:  1955-08-21   DATE OF ADMISSION:  06/25/2007  DATE OF DISCHARGE:  06/29/2007                               DISCHARGE SUMMARY   DISCHARGE DIAGNOSES:  1. Abdominal pain secondary to constipation in the setting of opioid      use.  2. Internal hemorrhoids.  3. Depression.   HISTORY OF PRESENT ILLNESS:  [name] is a 55 year old white male  with recent hospitalization in March 2009 with rectal bleeding secondary  to internal hemorrhoids, also with a history of factor V Leiden  deficiency on chronic anticoagulation and with seizure disorder.  The  patient presented on the day of admission to the emergency room with  complaints of abdominal pain.  Evaluation in the emergency room revealed  large amount of stool in the colon, but not in the rectal vault.  Attempts at stimulating bowel movement in the ER were unsuccessful.  At  that time, the patient was admitted for further evaluation and  treatment.   PAST MEDICAL HISTORY:  1. Factor V Leiden deficiency, on chronic anticoagulation.  2. 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  3. History of seizure disorder.  4. Chronic low back pain.  5. Depression.  6. Fibromyalgia.  7. Internal hemorrhoids with positive FOBs in March 2009.  8. Hypertension.   HOSPITAL COURSE:  1. Abdominal pain.  As mentioned above, large amount of stool was seen      in the colon with KUB done during admission.  The patient with      normal bowel gas pattern and no signs of obstruction.  The      patient's constipation likely secondary to chronic pain medications      in setting of chronic back pain.  No signs or symptoms of infection      throughout hospitalization.  The  patient's constipation is somewhat      relieved with p.r.n. Fleet enema.  However, the patient continued      to have abdominal pain over the weekend at which time      gastroenterology was asked to consult.  Of note, gastroenterology      also evaluated this patient at prior admission in March 2009 for      rectal bleeding thought secondary to internal hemorrhoids.  The      patient underwent colonoscopy on June 28, 2007, which revealed      internal hemorrhoids bleeding exacerbated by chronic Coumadin use      and addition chronic constipation again likely secondary to opioid      use.  The patient to continue on daily MiraLax at the time of      discharge indefinitely in the setting of chronic pain med use.      Gastroenterology recommends repeat colonoscopy in 5 years with      annual hemoccults  collected.  The patient denies any abdominal pain      at the time of discharge.  Constipation relieved with bowel prep      prior to colonoscopy.  2. Depression.  Of note, on admission, the patient noted to be on SSRI      in addition to tricyclic antidepressant.  Medications changed      during this admission.  Elavil and amitriptyline were both      discontinued and the patient started on Cymbalta for treatment of      depression and with chronic pain syndrome.  Medication adjustment      will need to be closely monitored by patient's primary care      physician.  The patient may need titration of this medication.   DISCHARGE MEDICATIONS:  1. Cymbalta 30 mg p.o. daily.  2. Klonopin 1 mg p.o. at bedtime.  3. MiraLax 17 grams in 8 ounces of water p.o. daily.  4. Lamictal 200 mg p.o. b.i.d.  5. Norvasc 5 mg p.o. daily.  6. Nexium 40 mg p.o. daily.  7. Coumadin 2.5 mg today, then 5 mg p.o. daily or as directed.  8. Oxycodone 5 mg p.o. q.6 h. as needed for pain.   PERTINENT LABORATORY WORK AT TIME OF DISCHARGE:  INR 3.0.  Liver  function tests and pancreatic enzymes within normal limits.   White cell  count 4.4, platelet count 228, hemoglobin 13.0, hematocrit 38.5.  Urinalysis was negative for any infection.  Creatinine 1.20.  Also of  note, the patient with acute abdominal series done on April 4 with no  evidence of obstruction.  In addition, chest x-ray on April 5 with no  acute cardiopulmonary findings.   DISCHARGE:  The patient felt medically stable for discharge home at this  time.  He is aware of new medications and prior medications which he is  to discontinue.  The patient is instructed to follow up with Dr. Jonny Ruiz on  April 23 at 11:30 a.m.  He is to follow up with Dr. Bosie Clos as needed.      Cordelia Pen, NP      Raenette Rover. Felicity Coyer, MD  Electronically Signed    LE/MEDQ  D:  06/29/2007  T:  06/29/2007  Job:  [id number]   cc:   Corwin Levins, MD
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
## 2010-08-07 NOTE — Consult Note (Signed)
NAMETARRY, FOUNTAIN NO.:  [id number]   MEDICAL RECORD NO.:  [id number]          PATIENT TYPE:  INP   LOCATION:  3036                         FACILITY:  [facility]   PHYSICIAN:  Antonietta Breach, M.D.  DATE OF BIRTH:  Dec 05, 1955   DATE OF CONSULTATION:  02/23/2007  DATE OF DISCHARGE:                                 CONSULTATION   REFERRING PHYSICIAN:  Sean A. Everardo All, MD.   REASON FOR CONSULTATION:  Rule out somatoform condition depression.   HISTORY OF PRESENT ILLNESS:  [name] is a 55 year old male  admitted to the [facility] on February 19, 2007, due to a convulsive  episode.   The organic evaluation for the convulsive episodes has been negative.  The presentation is not consistent with an organic etiology.   The patient does have a history of convulsions in the past.  His latest  period of convulsions began this past week.   [name] has recently been treated with Paxil 40 mg daily for  depression.  His energy is mildly decreased as well as is his  concentration.  However, he describes normal interests and constructive  future goals.  He does not have any thoughts of harming himself or  others.  He has no delusions or hallucinations.  He is oriented to all  spheres and is cooperative and socially appropriate with bedside care.   The patient expresses a normal level of frustration about his convulsive  experiences and the secondary complications.  He does not demonstrate  excessive repression.   PAST PSYCHIATRIC HISTORY:  [name] has experienced three prior  major depressive episodes.  One occurred after his divorce.  Another  occurred with the death of a grandparent.  Another one occurred with the  death of his father.   He denies any history of racing thoughts, decreased need for sleep,  increased energy or elevated mood.  He denies any history of suicide  attempts.   In review of the past medical record, the patient has been  tried on  Prozac.  He also has a history of excess worry, feeling on edge and  muscle tension.  He has been treated with Klonopin 2 mg nightly.  He has  a history of being treated with Celexa 40 mg daily.   Most recently, the patient has been maintained on Paxil 40 mg daily and  Klonopin 2 mg nightly.   FAMILY PSYCHIATRIC HISTORY:  None known.   PAST MEDICAL HISTORY:  Rule out seizure disorder.   ALLERGIES:  TOPAMAX.   MEDICATIONS:  MAR is reviewed.  1. Elavil 50 mg nightly.  2. Klonopin 1 mg nightly.  3. Lamictal 200 mg b.i.d.  4. Paxil 40 mg daily.   LABORATORY DATA:  TSH is within normal limits.   REVIEW OF SYSTEMS:  CONSTITUTIONAL:  Afebrile.  No weight loss.  HEAD:  No trauma.  EYES:  No visual changes.  EARS:  No hearing impairment.  NOSE:  No rhinorrhea.  THROAT:  No sore throat.  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  This is not acute.  NEUROLOGIC:  The  patient's convulsive presentation has involved clonic upper extremity  manifestations but tonic lower extremity posture.  The patient's wife  witnessed this manifestation and called 9-1-1 on the day of admission.  PSYCHIATRIC:  As above.  CARDIOVASCULAR:  No chest pain or palpitation.  RESPIRATORY:  No coughing or wheezing.  GASTROINTESTINAL:  No nausea,  vomiting, diarrhea.  GENITOURINARY:  No dysuria.  SKIN:  Unremarkable.  ENDOCRINE/METABOLIC:  No heat or cold intolerance.  MUSCULOSKELETAL:  No  deformities.  HEMATOLOGIC/LYMPHATIC:  Unremarkable.   PHYSICAL EXAMINATION:  VITAL SIGNS:  Temperature 98.2, pulse 88,  respiratory rate 20, blood pressure 137/76, O2 saturation on room air  100%.  GENERAL APPEARANCE:  [name] is a middle-aged male lying in a  supine position in his hospital bed with no abnormal involuntary  movements.  MENTAL STATUS EXAM:  [name] is alert.  He is oriented to all  spheres.  His attention span is within normal limits.  His eye contact  is good with his left eye.   He does not have a right eye.  His right  eyelid remains closed.  He is oriented completely to all spheres.  His  memory is intact to immediate, recent and remote.  His fund of knowledge  and intelligence are within normal limits.  His affect is mildly flat.  Mood is slightly anxious.  Speech involves normal rate and prosody  without dysarthria.  Thought process logical, coherent and goal-  directed.  No looseness of associations.  Abstraction intact.  Language,  expression and comprehension intact.  Thought content with no thoughts  of harming himself or others.  Ho delusions or hallucinations.  Insight  partial.  Judgment intact.   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  AXIS II:  Deferred.  AXIS III:  See general medical section above.  AXIS IV:  General medical.  AXIS V:  55.   [name] agrees to use the emergency services as needed.   DISCUSSION:  [name] does not present with the classic findings of  a somatoform disorder.  He does not present with classic manifestations  of excess repression.  However, a somatoform condition cannot be ruled  out.   RECOMMENDATIONS:  If the patient continues to present with physical  manifestations without an organic cause identified, then could recommend  outpatient psychotherapy which could also help with coping skills and  stress management regarding his condition.  If a somatoform condition  was present, the conditions of psychotherapy could relieve the  conversion dynamics.  He would have to be seen by a psychotherapist with  training and experience in handling somatoform conditions.  The social  worker could utilize his [employer] of therapists.  Also,  psychiatric followup is available to one of the clinics  attached to  [facility], [facility] or [facility]   With continuous Paxil at 40 mg daily for antidepression and antianxiety,  the patient may be a candidate for cognitive behavioral therapy with  deep breathing and progressive muscle relaxation for further antianxiety  benefit and potential elimination of the Klonopin.   Paxil can potentially double the blood level of Elavil through  cytochrome P450-2D6 enzyme competition.  Therefore, if there is any  concern about Elavil side effects, would check an elevated blood level.  Antonietta Breach, M.D.  Electronically Signed     JW/MEDQ  D:  02/23/2007  T:  02/24/2007  Job:  [id number]
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
## 2010-08-07 NOTE — H&P (Signed)
[name], [name]            ACCOUNT NO.:  [id number]   MEDICAL RECORD NO.:  [id number]          PATIENT TYPE:  INP   LOCATION:  1318                         [facility]ACILITY:  [facility]   PHYSICIAN:  Corwin Levins, MD      DATE OF BIRTH:  02-17-1956   DATE OF ADMISSION:  09/29/2006  DATE OF DISCHARGE:                              HISTORY & PHYSICAL   CHIEF COMPLAINT:  Weakness with worsening confusion as well as  questionable diarrhea and abdominal pain with blood increasing over the  last 3-4 days.   HISTORY OF PRESENT ILLNESS:  [name] is a 55 year old white male  with multiple medical problems including chronic pain syndrome and  narcotic use who presents with worsening symptoms as above.  He does not  seem to be in withdrawal.  He came to the ER with initial systolic blood  pressure of 70, now improved but still weak and prostrate with confusion  as well as abdominal pain, Tuesday worse a bit right-sided abdominal and  epigastric.  He has multiple loose stools that I would characterize as  more incontinence than diarrhea but has had some bright red blood per  rectum according to the family.  He is on chronic Coumadin.  He is now  for admission for further evaluation and treatment.   PAST MEDICAL HISTORY:  1. Fibromyalgia.  2. Obstructive sleep apnea/obesity.  3. Anxiety/depression.  4. Hypertension.  5. Recent pneumonia.  6. History of protein C and protein S deficiency on chronic Coumadin.  7. History of DVT.  8. Seizure disorder.  9. C-spine disk disease.  10.GERD.  11.History of peptic ulcer disease.  12.Status post appendectomy.   ALLERGIES:  TOPAMAX.   CURRENT MEDICATIONS:  1. Coumadin as directed.  2. Elavil 100 mg 1/2 p.o. daily.  3. Lamictal 200 mg b.i.d.  4. Nexium 40 mg p.o. daily.  5. Paxil 40 mg p.o. daily.  6. Klonopin 1 mg 2 q.h.s.  7. Percocet p.r.n.   SOCIAL HISTORY:  He is a smoker 1/2 pack-per-day.  No alcohol.  Married  and lives with  wife.   FAMILY HISTORY AND REVIEW OF SYSTEMS:  Otherwise noncontributory.   PHYSICAL EXAMINATION:  VITAL SIGNS:  He is afebrile.  Blood pressure  110/76 after IV fluids.  Initial systolic blood pressure was in the 70s  on arrival.  Heart rate 89, respirations 22, O2 saturation 96%.  GENERAL:  He is mildly ill-appearing, mildly confused, more acute on  chronic.  ENT:  Sclerae clear.  TMs clear.  Trachea midline.  NECK:  No lymphadenopathy, JVD, or thyromegaly.  CHEST:  No rales or wheezing.  CARDIAC:  Regular rate and rhythm.  ABDOMEN:  Soft and positive bowel sounds.  There is fairly marked  tenderness in the right abdominal area as well as epigastric region.  EXTREMITIES:  No edema.   LABORATORY DATA:  Head CT:  No acute.  UA with 3-6 white blood cells,  many bacteria, small leukocyte esterase.  Electrolytes within normal  limits except for a potassium of 3.4, BUN 19, creatinine 1.4, glucose  100, INR 3.9, white blood  cell count mildly elevated at 12.4, hemoglobin  12.1, MECG:  Sinus rhythm, no acute changes.   ASSESSMENT/PLAN:  1. Abdominal pain.  Questionable urinary tract infection +/- colitis      versus other.  We will check urine and stool cultures, check C.      difficile toxin, start empiric Cipro, Flagyl IV as well as IV      fluids.  2. Hypotension.  Suspect dehydration more than anything but cannot      rule out early sepsis.  We will check blood cultures and admit for      treatment as above.  3. Confusion.  Also likely secondary to above.  PT negative.  Continue      to follow.  4. Leukocytosis.  Likely secondary to #1.  Continue to follow.  5. Elevated INR/Coumadin toxicity.  We will hold the Coumadin tonight,      give vitamin K 10 mg x1 now, pharmacy to manage otherwise.  6. Bright red blood per rectum.  Possibly __________ versus primary      such as with colitis, difficult to tell whether he has had some      incontinence versus actual diarrhea.  Check labs as  above and      consider a gastroenterology consultation.  7. Epigastric and right-sided abdominal pain, questionably a urinary      tract infection again +/- colitis, +/- for recurrent peptic ulcer      disease or other.  Check H. pylori, continue proton pump inhibitor      therapy, consider gastroenterology consultation.  Give antibiotics      as above.  Consider a CT.  8. Hypokalemia, replace IV.   DISPOSITION:  1. Somewhat better.  2. Code status is full.      Corwin Levins, MD  Electronically Signed     JWJ/MEDQ  D:  09/29/2006  T:  09/30/2006  Job:  [id number]
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
## 2010-08-07 NOTE — Consult Note (Signed)
NAME:  [name], [name] NO.:  [id number]   MEDICAL RECORD NO.:  [id number]          PATIENT TYPE:  INP   LOCATION:  5530                         FACILITY:  [facility]   PHYSICIAN:  Bernette Redbird, M.D.   DATE OF BIRTH:  [birth year]   DATE OF CONSULTATION:  06/11/2007  DATE OF DISCHARGE:                                 CONSULTATION   We were asked to see Mr. Handler today in consultation at the request  of Dr. Rene Paci for heme-positive stool and bright red blood per  rectum.  Today's date June 11, 2007.   HISTORY OF PRESENT ILLNESS:  This is a 55 year old male with a very  recent history of MRSA on his buttocks, complaining of severe abdominal  pain and one episode of painless bright red blood per rectum last night  about 9:30 p.m.  The patient had one episode of large bright red blood  per rectum associated with stool.  Afterwards, he became dizzy and went  to the ER.  He denies any NSAID use.  He has been on Coumadin for his  factor V Leiden deficiency.  His INR has been between 2 and 2.5 lately.  He reports severe abdominal pain in the lower quadrants.  No vomiting.  Per E-chart, the patient has had bright red blood per rectum multiple  times before.  He also reports hand pain and swelling bilaterally, as  well as difficulty, hesitancy, with urination and he is requesting  Dilaudid.   PAST MEDICAL HISTORY:  Significant for  1. Factor V Leiden deficiency.  He is on Coumadin.  2. History of DVT and PE.  3. History of MRSA skin infection, currently on SMZ.  4. History of seizures.  5. Chronic back pain.  6. Fibromyalgia.  7. History of the brain lesion.  8. Angina.  9. Obstructive sleep apnea.  10.Asthma.  11.Depression.  12.He has had an appendectomy.  13.GI workup includes:      a.     April 01, 2003, a colonoscopy by Dr. Claudette Head for       hematochezia that did demonstrate some internal hemorrhoids.  B.       An upper endoscopy in January  2005 by Dr. Claudette Head for       dysphagia and reflux, which was normal.      b.     In March 2008, he had an upper endoscopy by Dr. Charlott Rakes for dysphagia and odynophagia, which was normal.  It was       felt that odynophagia might be due to lack of salivation in his       mouth causing some esophageal dysmotility.  14.His last CT scan was done in July 2008, for hematochezia.  15.He had mild changes of colitis involving the rectosigmoid.   ALLERGIES:  He has an allergy to TOPAMAX.   CURRENT MEDICATIONS:  Include Coumadin, Nexium, Norvasc, Lamictal,  amitriptyline, Percocet and SMZ.   REVIEW OF SYSTEMS:  Positive for nausea and decreased appetite, hand  pain and urinary hesitancy.   SOCIAL HISTORY:  Negative  for alcohol, tobacco and drugs.  He is  married, he is disabled.   FAMILY HISTORY:  Negative for GI history was the exception of his uncle  who has alcoholic cirrhosis.   PHYSICAL EXAMINATION:  GENERAL APPEARANCE:  He is alert and oriented,  reporting that he is in abdominal pain.  VITAL SIGNS:  Temperature 98, pulse 80, respirations 24, blood pressure  is 124/84.  HEART:  Regular rate and rhythm.  LUNGS:  Clear to auscultation bilaterally.  ABDOMEN:  Distended.  The patient reports it is a little bit more than  normal, feels moderately firm, tender in the central and lower  quadrants.  He has positive bowel sounds.  RECTAL:  The exam appeared to be somewhat painful for the patient.  Clear rectal fluid was obtained.  It was grossly guaiac positive.   LABORATORY DATA:  BUN 18, creatinine 1.23.  White count 6.7, hemoglobin  12.7, platelets 187.  INR 2.4, PT 26.7.  He is guaiac positive.  Lamictal level of 8.3.   CT abdomen is pending.   ASSESSMENT:  Dr. Molly Maduro Buccini has seen and examined the patient,  collected a history.  His impression is  1. Abdominal pain is very nonspecific in character, not associated      with objective ___________ such as  fever, increased white blood      count or abdominal tenderness.  Agree with plan for CT.  2. Rectal bleeding, consistent with internal hemorrhoids, stable      hemoglobin, painless accompanying bowel movements.  We will monitor      for now.  If it occurs not in association with a bowel movement or      it is associated with a drop in hemoglobin and/or hemodynamic      instability, may need lower gastrointestinal workup this admission.      Otherwise, would favor elective outpatient colonoscopy, has not      been done for 4 years, by the patient's primary GI physician, Dr.      Bosie Clos, perhaps under propofol sedation.  Will follow with you.   Thank you very much for this consultation.      Stephani Police, PA    ______________________________  Bernette Redbird, M.D.    MLY/MEDQ  D:  06/11/2007  T:  06/11/2007  Job:  [id number]   cc:   Shirley Friar, MD

## 2010-08-07 NOTE — Discharge Summary (Signed)
[name], [name]            ACCOUNT NO.:  [id number]   MEDICAL RECORD NO.:  [id number]          PATIENT TYPE:  OBV   LOCATION:  1429                         FACILITY:  [facility]   PHYSICIAN:  Valetta Mole. Swords, MD    DATE OF BIRTH:  November 21, 1955   DATE OF ADMISSION:  05/14/2007  DATE OF DISCHARGE:  05/15/2007                               DISCHARGE SUMMARY   DISCHARGE DIAGNOSES:  1. Frequent falls.  2. Chest discomfort.  3. Mood disorder with depression.  4. History of seizure disorder.  5. Folliculitis on back, treated with Doxycycline.  6. Chronic back pain.  7. History of pulmonary embolus.  8. History of deep vein thrombosis.  9. Fibromyalgia.  10.Peptic ulcer disease.  11.Gastroesophageal reflux disease.  12.History of sleep apnea.  13.Protein C deficiency.   DISCHARGE MEDICATIONS:  1. Clonazepam 1 mg p.o. q.h.s.  2. Amlodipine 5 mg p.o. daily.  3. Warfarin as directed.  4. Amitriptyline 100 mg 1/2 p.o. daily.  5. Lamictal 200 mg b.i.d.  6. Nexium 40 mg p.o. daily.  7. Paroxetine 40 mg p.o. daily.  8. Oxycodone 20 mg p.o. daily.  9. Doxycycline 100 mg p.o. b.i.d.   LABORATORY DATA:  INR 2.8. Cardiac markers negative. Urinalysis  unremarkable. Lipase, CMP, D-dimer, CBC all normal.   PENDING LABORATORY DATA:  Lamictal level.   PENDING CONSULTATIONS:  Physical therapy to evaluate the patient's gait  and recommend safety modalities.   HOSPITAL COURSE:  1. The patient admitted to the hospital service on May 14, 2007      with history of frequent falls. The patient admitted by Dr.      Rito Ehrlich. The patient did well in the hospital. At the time of      discharge, he was ambulating in the halls without difficulty. He      did say he felt somewhat tired and complained of chronic back pain      and leg pain. At this time, I do not think there is any benefit to      further hospitalization. I actually think the risk of further      hospitalization outweighs the  possible gains. No active treatment      is ongoing. I will have physical therapy see the patient prior to      discharge to recommend any safety modalities. The patient may need      a walker given his history of falls.  2. Seizure disorder. I have asked the patient to see his neurologist      this week.   CONDITION ON DISCHARGE:  Stable.   FOLLOWUP:  Dr. Melvyn Novas May 19, 2007. That appointment was made.      Bruce Rexene Edison Swords, MD  Electronically Signed     BHS/MEDQ  D:  05/15/2007  T:  05/15/2007  Job:  [id number]

## 2010-08-07 NOTE — Procedures (Signed)
EEG NUMBER:  [id number].   CLINICAL HISTORY:  The patient is a 55 year old with possible stroke and  altered mental status.  Study is being done to look for the presence of  seizures (293.0, 780.39).   PROCEDURE:  The tracing is carried out on a 32 channel digital Cadwell  recorder reformatted into 16 channel montages with one devoted to EKG.  The patient was awake and drowsy during the recording.  The  [employer] 10/20 system lead placement was used.  Medications  included Dilaudid, Paxil, Elavil, Klonopin, Lyrica, Lamictal, Norvasc,  and Coumadin.   DESCRIPTION OF FINDINGS:  Dominant frequency is a 7 Hz 25 microvolt  activity.  Background activity is mixed frequency, predominantly theta  and upper delta range activity.  Photic stimulation induced a driving  response from 1-61 Hz.  Hyperventilation caused mild potentiation of the  background.   There was no interictal epileptiform activity in the form of spikes or  sharp waves.   EKG showed regular sinus rhythm with ventricular response of 84 beats  per minute.   IMPRESSION:  Abnormal electroencephalogram on the basis of diffuse  background slowing.  This is a nonspecific indicator of neuronal  dysfunction that may be on a primary degenerative basis or secondary to  a variety of toxic or metabolic etiologies.  No seizures were seen in  the record.      Deanna Artis. Sharene Skeans, M.D.  Electronically Signed     WRU:EAVW  D:  01/23/2007 21:40:03  T:  01/25/2007 12:04:00  Job #:  [id number]
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
## 2010-08-10 NOTE — Procedures (Signed)
PROCEDURE:  Electroencephalogram.   MEDICATIONS:  ____________, Dilantin, Klonopin, Elavil, Lisinopril, Paxil,  Oxycodone, and Restoril.   CLINICAL HISTORY:  A 55 year old male being evaluated for seizures and  prolonged post-ictal state.   HISTORY OF PRESENT ILLNESS:  This is a routine EEG performed with the  patient described as being awake and alert.   Background awake rhythm consists probably of 9 to 10 hertz alpha, which is  administered of high frequency low amplitude beta activity, which is seen  throughout the tracing. No paroxysmal epileptiform activity, spikes, or  sharp waves are seen.  Excessive beta activity is seen throughout the  tracing, which may be medication effect. Length of tracing is 23.7 minutes.  Technical component is average. EKG tracing reveals regular sinus rhythm.  Hyperventilation is not performed. Photic stimulation is unremarkable.   IMPRESSION:  This EEG performed during wakeful states is within normal  limits.  Excessive beta activity is medication effect. No definite  epileptiform activity is seen.           ______________________________  Sunny Schlein. Pearlean Brownie, MD     SWN:[id number]  D:  09/24/2005 16:26:41  T:  09/24/2005 17:53:05  Job #:  [id number]   cc:   Genene Churn. Love, M.D.  Fax: [phone]
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
## 2010-08-10 NOTE — Discharge Summary (Signed)
NAMEBRENTLY, VOORHIS NO.:  [id number]   MEDICAL RECORD NO.:  [id number]          PATIENT TYPE:  INP   LOCATION:  3707                         FACILITY:  [facility]   PHYSICIAN:  Stacie Glaze, MD    DATE OF BIRTH:  09/27/1955   DATE OF ADMISSION:  12/12/2005  DATE OF DISCHARGE:  12/14/2005                                 DISCHARGE SUMMARY   ADMITTING DIAGNOSES:  1. Chest pain.  2. Dilantin toxicity.  3. Seizure disorder.  4. Chronic pain syndrome.   DISCHARGE DIAGNOSES:  1. Dilantin toxicity, resolving.  2. Chest pain, rule out myocardial infarction, myocardial infarction ruled      out.  3. Chronic hypercoagulation.  4. Chronic pain syndrome.   The patient is a 55 year old white male admitted to the hospital by Dr.  Gala Romney on call Friday night for Dilantin toxicity and chest pain.  He was  admitted on a rule out MI protocol and seizure precaution protocol as his  enzymes were negative in the emergency room but he continued to have burning  lower chest pain.  His Dilantin levels were found to be greater than 40 and  he had apparent dilantin toxicity.  He was admitted to the general medicine  service and was quickly found to have a decreasing Dilantin level from 40 to  31.5 after being held for 24 hours.  He had no seizure activity and his  cardiac enzymes continued to be negative.  He was also found to have  elevated triglycerides of 707, increasing his cardiovascular risk factors,  but he was not felt to have had anginal type pain.   WORKING DIAGNOSES:  1. Chronic reflux esophagitis.  The patient states that he has been      complaining of some difficulty swallowing chicken and meat over the      past few months.  2. Dilantin toxicity possibly secondary to an increased dose of Lamictal      at his last neurology visit.  He states that his neurologist did tell      him that his Dilantin was borderline high 2 months ago.  3. Chest pain, rule out  myocardial infarction, myocardial infarction ruled      out.  4. Chronic anticoagulation therapy.  5. Depression.   DISPOSITION:  The patient states that his wife prepares his medications for  him and he does not drive.  The patient will be discharged to home care.  He  is instructed to follow up with Dr. Efrain Sella within 2 weeks.  A Dilantin  level should be obtained at that time.  New medications are Prilosec 20 mg,  1 p.o. daily.  Dilantin was changed to 100 mg  twice a day.  Because of the patient's insurance status, we would recommend  to Dr. Jonny Ruiz that he be given samples of TriCor 145 mg once a day and be  considered as a candidate, since he has no medicine benefits, for medicine-  assistant program for his other medications.           ______________________________  Stacie Glaze, MD  JEJ/MEDQ  D:  12/14/2005  T:  12/17/2005  Job:  [id number]   cc:   Corwin Levins, MD
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
## 2010-08-10 NOTE — Discharge Summary (Signed)
[name], [name]            ACCOUNT NO.:  [id number]   MEDICAL RECORD NO.:  [id number]          PATIENT TYPE:  INP   LOCATION:  5715                         FACILITY:  [facility]   PHYSICIAN:  Maisie Fus A. Cornett, M.D.DATE OF BIRTH:  03-29-1955   DATE OF ADMISSION:  09/22/2004  DATE OF DISCHARGE:  10/10/2004                                 DISCHARGE SUMMARY   ADDENDUM:   ADDITIONAL DISCHARGE DIAGNOSES:  1.  Partially occlusive deep venous thrombosis in the posterior tibial vein.  2.  PICC line infection.   CONTINUED HOSPITAL COURSE:  On the day of discharge patient became  noticeably sicker as the day wore on.  We were called to reevaluate the  patient prior to discharge later on on the 8th.  He was found to be  diaphoretic, felt bad, had some significant bilateral lower extremity pain,  and some shortness of breath.  He also began running temperatures at this  point.  He was pan cultured and had urgent lower extremity Dopplers done.  Lower extremity Dopplers did show a partially occlusive clot in the  posterior tibial vein.  He was started on heparin.  After a day or two on  this it was noted that his PICC line site appeared infected.  This was  discharged and peripheral access was obtained.  Catheter tip was cultured,  but failed to grow out any organisms.  Regardless, he was started on  prophylactic antibiotics for this.  He was kept on heparin until that Friday  when Dr. Pollyann Kennedy took him back to surgery to take down his MMF wire.  He did  well from that standpoint and recovered nicely.  He was felt ready to be  discharged home on the 19th in good condition in the care of his wife.  He  had just become therapeutic on his Coumadin and we are going to have that  managed as an outpatient by Dr. Melvyn Novas who is his primary care Donal Lynam.  Dr.  Pollyann Kennedy is going to see him for follow-up on his face in four weeks.   DISCHARGE MEDICATIONS:  1.  Lisinopril, unchanged.  2.  Prozac,  unchanged.  3.  Elavil, unchanged.  4.  Klonopin, unchanged.  5.  Dilantin is now 200 mg p.o. b.i.d.  6.  Tobrex, unchanged.  7.  Percocet is now 5/325 take one to two q.4h. p.r.n. pain, #60 with no      refill.  8.  He also was written a prescription for Coumadin 5 mg tablets take one      p.o. daily, #10 with no refill.  9.  The medications that he did not get sent home on are the Celexa, the      Augmentin which he completed a seven-day course of, the fentanyl patch,      and the Dilaudid.   If he has any questions or concerns he will call.  Otherwise, follow-up will  be as stated above.      Micha   MJ/MEDQ  D:  10/10/2004  T:  10/10/2004  Job:  [id number]

## 2010-08-10 NOTE — Op Note (Signed)
[name], [name]            ACCOUNT NO.:  [id number]   MEDICAL RECORD NO.:  [id number]          PATIENT TYPE:  AMB   LOCATION:  ENDO                         FACILITY:  [facility]   PHYSICIAN:  Shirley Friar, MDDATE OF BIRTH:  [birth year]   DATE OF PROCEDURE:  05/29/2006  DATE OF DISCHARGE:                               OPERATIVE REPORT   PROCEDURE:  Upper endoscopy.   INDICATIONS:  Dysphagia, odynophagia, abnormal barium swallow, question  of upper esophageal stricture.   MEDICATIONS:  Fentanyl 50 mcg IV, Versed 3 mg IV.   FINDINGS:  The endoscope was inserted into the oropharynx and the  esophagus was intubated which was carefully visualized and no evidence  of intraluminal mass was seen.  No extrinsic compression was visualized  and no stricture was seen.  The mucosal lining of the esophagus appeared  normal.  The GE junction was noted to be at 40 cm from the entrance of  the mouth.  The endoscope was advanced down to the stomach which was  normal in appearance including normal proximal, distal, and mid stomach.  The endoscope was then advanced into the duodenal bulb and second  portion of the duodenum which were both normal.  The endoscope was then  withdrawn and the esophagus again re-evaluated and no stricture or  obstruction was seen.   ASSESSMENT:  Normal EGD.  I suspect odynophagia and dysphagia secondary  to lack of salivation with a dry mouth, possibly from medicines.   PLAN:  1. Use salivary substitutes to aid in swallowing  2. The patient will continue Protonix.  3. If salivary substitution are not effective, then consider      esophageal motility study.      Shirley Friar, MD  Electronically Signed     VCS/MEDQ  D:  05/29/2006  T:  05/29/2006  Job:  [id number]

## 2010-08-10 NOTE — Discharge Summary (Signed)
NAMESAHAJ, BONA            ACCOUNT NO.:  [id number]   MEDICAL RECORD NO.:  [id number]          PATIENT TYPE:  INP   LOCATION:  1402                         [facility]AC[facility]   PHYSICIAN:  Rosalyn Gess. Norins, MD  DATE OF BIRTH:  September 27, 1955   DATE OF ADMISSION:  07/16/2006  DATE OF DISCHARGE:  07/18/2006                               DISCHARGE SUMMARY   ADMITTING DIAGNOSES:  1. Question of pneumonia with progressive cough.  2. Back pain.   DISCHARGE DIAGNOSES:  1. Respiratory: Patient stable on oral antibiotics.  2. Back pain.  Nonsteroidal anti-inflammatory drugs indicated  3. Neuro: Stable.   CONSULTANTS:  None.   PROCEDURES:  1. Chest x-ray revealed patchy infiltrate at both lung bases, left      more than right.  2. CT scan of the head which showed no acute intracranial      abnormalities with mild chronic microvascular ischemic changes  3. 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   HISTORY OF PRESENT ILLNESS:  Per Dr. Alwyn Ren, the patient is 55 year old  gentleman with bilateral pulmonary infiltrates in the context several  weeks of cough and progressive weakness.  He presented to the emergency  room on the day of admission reporting weakness to the extent that he  could not move his legs or ambulate.   Please see Dr. Frederik Pear admit note for past medical history, family  history, social history and admission medications.   Admission physical exam was significant for being afebrile, blood  pressure being stable.  Chest revealed breath sounds to be decreased  with no increased work of breathing at rest.   HOSPITAL COURSE:  1. Pulmonary: The patient was started on Avelox IV and then switched      to p.o. Avelox on the 24th.  He remained afebrile.[name]  He had no      increased work of breathing. He had good gas exchange with on O2      sat on room air  that was 96%.  With the patient being stable with      no increased work of breathing he is to be discharged home on oral      Avelox with followup as an outpatient.  2. Back pain.  The patient complained of low back pain.  X-ray was      unremarkable.  Suspect this may be arthritic in nature with      musculoskeletal component.  The patient was on oxycodone with APAP.      We will add Relafen 1500 mg q.h.s.  3. Neuro: Patient with a history of closed head injury.  CT scan of      the brain was unremarkable.  The patient had no change in      neurologic status.   FINAL LABORATORY:  Urinalysis on July 16, 2006 was negative. CBC at  admission was white count 5700 with a normal differential.  Hemoglobin  13.3 g, hematocrit 38.3% with 57%  segs, 29% lymphs, 7% monos.  INR was  3.4 on admission and was  2.7 day of discharge.  Chemistries were  unremarkable with a glucose of 109, BUN of 18, creatinine 1.4, sodium  140, potassium 4.2.  Estimated GFR was 53 mL per minute.   DISCHARGE EXAMINATION:  VITAL SIGNS:  Temperature 97.7, blood pressure  was 106/65, heart rate 80, respirations 18, O2 sat 98% on 2 L.  GENERAL APPEARANCE:  Heavy-set Caucasian male eating his breakfast in no  distress.  HEENT EXAM: The patient has a chronically closed right eye.  CHEST: Clear with no rales, wheezes or rhonchi.  CARDIOVASCULAR:  2+ radial pulse.  He had a regular rate and rhythm.  No  further exam conducted.   DISPOSITION:  The patient is discharged home to follow up with Dr. Efrain Sella in 5-7 days.   DISCHARGE MEDICATIONS:  1. Topamax 100 mg daily.  2. Lamictal 400 mg b.i.d.  3. Coumadin as directed.  4. Paxil 40 mg daily.  5. Clonazepam 1 mg q.h.s..  6. Oxycodone with APAP 5/325 one q.6h. p.r.n.  7. Flexeril 10 mg q.8h. p.r.n.  8. Avelox 400 mg daily x5.  9. Relafen 750 mg 2 tablets q.h.s.   The patient's condition at time of discharge dictation is stable and  improved.      Rosalyn Gess  Norins, MD  Electronically Signed     MEN/MEDQ  D:  07/18/2006  T:  07/18/2006  Job:  [id number]
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
## 2010-08-16 ENCOUNTER — Telehealth: Payer: Self-pay

## 2010-08-16 MED ORDER — CLONAZEPAM 2 MG PO TBDP: 2.0000 mg | ORAL_TABLET | Freq: Every evening | ORAL | Status: DC | PRN

## 2010-08-16 NOTE — Telephone Encounter (Signed)
Pt advised, Rx faxed to pharmacy 

## 2010-08-16 NOTE — Telephone Encounter (Signed)
Done hardcopy to dahlia/LIM B  

## 2010-08-16 NOTE — Telephone Encounter (Signed)
Pt called stating that [facility] has been attempting to contact out office to obtain approval to change Clonazepam 1 mg 2 po qhs to 2mg  1 po qhs. Per EMR Rx was changed and faxed to [facility] 03/19, however [facility] states that they did not change Rx because they need verbal authorization to cancel original Rx and replace with 2 mg but was not able to speak with MD directly. Pt will be due for refill 07/29 and [facility] and pt are requesting authorization to change Rx again. Okay to update with #90 x 1?

## 2010-08-21 ENCOUNTER — Telehealth: Payer: Self-pay

## 2010-08-21 NOTE — Telephone Encounter (Signed)
Reg tab ok

## 2010-08-21 NOTE — Telephone Encounter (Signed)
Pharmacy requesting clarification on Clonazepam 2 mg, is clonazepam 2 mg (regular tablets) or oral Disintegrating tabs? Please advise.

## 2010-08-22 NOTE — Telephone Encounter (Signed)
Pharmacy informed of MD's clarification on patients medication.

## 2010-10-05 ENCOUNTER — Encounter: Payer: Self-pay | Admitting: Internal Medicine

## 2010-10-05 ENCOUNTER — Ambulatory Visit (INDEPENDENT_AMBULATORY_CARE_PROVIDER_SITE_OTHER): Payer: Medicare PPO | Admitting: Internal Medicine

## 2010-10-05 VITALS — BP 120/82 | HR 84 | Temp 97.2°F | Resp 14 | Wt 183.0 lb

## 2010-10-05 DIAGNOSIS — Z0000 Encounter for general adult medical examination without abnormal findings: Secondary | ICD-10-CM

## 2010-10-05 DIAGNOSIS — F411 Generalized anxiety disorder: Secondary | ICD-10-CM

## 2010-10-05 DIAGNOSIS — I10 Essential (primary) hypertension: Secondary | ICD-10-CM

## 2010-10-05 DIAGNOSIS — G4700 Insomnia, unspecified: Secondary | ICD-10-CM

## 2010-10-05 MED ORDER — TRAZODONE HCL 50 MG PO TABS: 50.0000 mg | ORAL_TABLET | Freq: Every day | ORAL | Status: DC

## 2010-10-05 MED ORDER — ZOLPIDEM TARTRATE 10 MG PO TABS: 10.0000 mg | ORAL_TABLET | Freq: Every evening | ORAL | Status: DC | PRN

## 2010-10-05 NOTE — Assessment & Plan Note (Signed)
stable overall by hx and exam, most recent data reviewed with pt, and pt to continue medical treatment as before  BP Readings from Last 3 Encounters:  10/05/10 120/82  03/27/10 122/78  03/07/09 104/62

## 2010-10-05 NOTE — Assessment & Plan Note (Signed)
Chronic persistent requiring mult meds, current med regimen not working well,  For change to [place] and trazodone asd,  to f/u any worsening symptoms or concerns

## 2010-10-05 NOTE — Assessment & Plan Note (Signed)
stable overall by hx and exam, most recent data reviewed with pt, and pt to continue medical treatment as before  Lab Results  Component Value Date   WBC 5.6 03/27/2010   HGB 14.9 03/27/2010   HCT 43.3 03/27/2010   PLT 192.0 03/27/2010   CHOL 127 05/22/2010   TRIG 161.0* 05/22/2010   HDL 33.90* 05/22/2010   ALT 53 05/22/2010   AST 37 05/22/2010   NA 141 03/27/2010   K 4.6 03/27/2010   CL 106 03/27/2010   CREATININE 1.2 03/27/2010   BUN 15 03/27/2010   CO2 30 03/27/2010   TSH 1.54 03/27/2010   PSA 0.96 03/27/2010   INR 2.1 RATIO* 05/22/2007

## 2010-10-05 NOTE — Progress Notes (Signed)
  Subjective:    Patient ID: SQUIRE WITHEY, male    DOB: [birth year], 55 y.o.   MRN: [id number]  HPI  Here to f/u; Pt denies chest pain, increased sob or doe, wheezing, orthopnea, PND, increased LE swelling, palpitations, dizziness or syncope.  Pt denies new neurological symptoms such as new headache, or facial or extremity weakness or numbness   Pt denies polydipsia, polyuria.  Clonazepam and elavil not working well now for sleep, and miserable. Denies worsening depressive symptoms, suicidal ideation, or panic, though has ongoing anxiety, not increased recently.  Overall good compliance with treatment, and good medicine tolerability. Past Medical History  Diagnosis Date  . Hypertension   . Seizures   . Personal history of venous thrombosis and embolism 02/01/2007  . Abdominal pain, generalized 06/23/2007  . Other dysphagia 10/06/2008  . RASH-NONVESICULAR 05/12/2007  . INSOMNIA-SLEEP DISORDER-UNSPEC 03/07/2009  . SEIZURE DISORDER 09/29/2006  . FIBROMYALGIA 02/01/2007  . ROTATOR CUFF SYNDROME, RIGHT 02/01/2007  . BACK PAIN 05/12/2007  . NECK PAIN, LEFT 03/07/2009  . DISC DISEASE, CERVICAL 02/01/2007  . SPONDYLOSIS, LUMBAR 02/01/2007  . PEPTIC ULCER DISEASE 02/01/2007  . GERD 09/29/2006  . BRONCHITIS, ACUTE 02/27/2009  . HYPERTENSION 09/29/2006  . PAIN, CHRONIC NEC 09/29/2006  . SLEEP APNEA, OBSTRUCTIVE, MODERATE 09/29/2006  . DEPRESSION 09/29/2006  . ANXIETY 09/29/2006  . PROTEIN C DEFICIENCY 09/29/2006  . ANEMIA, PROTEIN-DEFICIENCY 09/29/2006   Past Surgical History  Procedure Date  . Appendectomy   . Electrical stimulator in back     reports that he has never smoked. He does not have any smokeless tobacco history on file. He reports that he does not drink alcohol or use illicit drugs. family history includes Hypertension in his other. Allergies  Allergen Reactions  . Topiramate    Current Outpatient Prescriptions on File Prior to Visit  Medication Sig Dispense Refill  . amLODipine (NORVASC) 5 MG  tablet Take 5 mg by mouth daily.        [name] aspirin 81 MG tablet Take 81 mg by mouth daily.        [name] lamoTRIgine (LAMICTAL) 200 MG tablet Take 200 mg by mouth daily.        [name] oxycodone (OXYCONTIN) 30 MG TB12 Take 60 mg by mouth every 12 (twelve) hours.          Review of Systems Review of Systems  Constitutional: Negative for diaphoresis and unexpected weight change.  HENT: Negative for drooling and tinnitus.   Eyes: Negative for photophobia and visual disturbance.  Respiratory: Negative for choking and stridor.   Gastrointestinal: Negative for vomiting and blood in stool.    Objective:   Physical Exam BP 120/82  Pulse 84  Temp(Src) 97.2 F (36.2 C) (Oral)  Resp 14  Wt 183 lb (83.008 kg)  SpO2 96% Physical Exam  VS noted Constitutional: Pt appears well-developed and well-nourished.  HENT: Head: Normocephalic.  Right Ear: External ear normal.  Left Ear: External ear normal.  Eyes: Conjunctivae and EOM are normal. Pupils are equal, round, and reactive to light.  Neck: Normal range of motion. Neck supple.  Cardiovascular: Normal rate and regular rhythm.   Pulmonary/Chest: Effort normal and breath sounds normal.  Neurological: Pt is alert. No cranial nerve deficit.  Skin: Skin is warm. No erythema.  Psychiatric: Pt behavior is normal. Thought content normal. 1+ nervous, not depressed affect       Assessment & Plan:

## 2010-10-05 NOTE — Patient Instructions (Signed)
Please take half of the clonazepam until finished OK to stop the elavil Take all new medications as prescribed - the zolpidem 10, and the trazodone 50 when it arrives from [facility] Continue all other medications as before Please return in 6 mo with Lab testing done 3-5 days before

## 2010-10-06 ENCOUNTER — Encounter: Payer: Self-pay | Admitting: Internal Medicine

## 2010-10-29 ENCOUNTER — Other Ambulatory Visit (INDEPENDENT_AMBULATORY_CARE_PROVIDER_SITE_OTHER): Payer: Medicare PPO

## 2010-10-29 ENCOUNTER — Ambulatory Visit (INDEPENDENT_AMBULATORY_CARE_PROVIDER_SITE_OTHER): Payer: Medicare PPO | Admitting: Internal Medicine

## 2010-10-29 ENCOUNTER — Encounter: Payer: Self-pay | Admitting: Internal Medicine

## 2010-10-29 VITALS — BP 102/70 | HR 90 | Temp 98.8°F | Ht 68.0 in | Wt 173.8 lb

## 2010-10-29 DIAGNOSIS — I10 Essential (primary) hypertension: Secondary | ICD-10-CM

## 2010-10-29 DIAGNOSIS — R1011 Right upper quadrant pain: Secondary | ICD-10-CM | POA: Insufficient documentation

## 2010-10-29 LAB — BASIC METABOLIC PANEL
BUN: 18 mg/dL (ref 6–23)
Calcium: 9.3 mg/dL (ref 8.4–10.5)
Creatinine, Ser: 1.2 mg/dL (ref 0.4–1.5)

## 2010-10-29 LAB — H. PYLORI ANTIBODY, IGG: H Pylori IgG: NEGATIVE

## 2010-10-29 LAB — CBC WITH DIFFERENTIAL/PLATELET
Basophils Absolute: 0 10*3/uL (ref 0.0–0.1)
Eosinophils Absolute: 0.2 10*3/uL (ref 0.0–0.7)
HCT: 45.8 % (ref 39.0–52.0)
Hemoglobin: 15.6 g/dL (ref 13.0–17.0)
Lymphocytes Relative: 32.8 % (ref 12.0–46.0)
Lymphs Abs: 1.8 10*3/uL (ref 0.7–4.0)
MCHC: 34.1 g/dL (ref 30.0–36.0)
Neutro Abs: 3 10*3/uL (ref 1.4–7.7)
Platelets: 178 10*3/uL (ref 150.0–400.0)
RDW: 13.2 % (ref 11.5–14.6)

## 2010-10-29 LAB — URINALYSIS, ROUTINE W REFLEX MICROSCOPIC
Hgb urine dipstick: NEGATIVE
Urine Glucose: NEGATIVE
Urobilinogen, UA: 0.2 (ref 0.0–1.0)

## 2010-10-29 LAB — HEPATIC FUNCTION PANEL
AST: 24 U/L (ref 0–37)
Alkaline Phosphatase: 75 U/L (ref 39–117)
Bilirubin, Direct: 0.2 mg/dL (ref 0.0–0.3)
Total Protein: 7.9 g/dL (ref 6.0–8.3)

## 2010-10-29 LAB — LIPASE: Lipase: 26 U/L (ref 11.0–59.0)

## 2010-10-29 MED ORDER — OMEPRAZOLE 20 MG PO CPDR: 20.0000 mg | DELAYED_RELEASE_CAPSULE | Freq: Two times a day (BID) | ORAL | Status: DC

## 2010-10-29 NOTE — Patient Instructions (Addendum)
Take all new medications as prescribed - the omeprazole increased to twice per day Continue all other medications as before Please go to LAB in the Basement for the blood and/or urine tests to be done today Please call the phone number [phone] (the PhoneTree System) for results of testing in 2-3 days;  When calling, simply dial the number, and when prompted enter the MRN number above (the Medical Record Number) and the # key, then the message should start. You will be contacted regarding the referral for: GI, and the Abdomen ultrasound

## 2010-10-29 NOTE — Assessment & Plan Note (Signed)
With epigastric discomfort as well;  Today for repeat labs including h pylori, lipase, increase the PPI to bid, abd u/s, and refer GI - ? Need EGD

## 2010-10-29 NOTE — Progress Notes (Signed)
Subjective:    Patient ID: [name], male    DOB: Sep 29, 1955, 55 y.o.   MRN: [id number]  HPI  Here to f/u after being seen in ER at [facility], CT abd/pelvis neg per pt as well as cxr, but pt cont's to have upper abd/ruq pain, with ongoing wt loss;  Peak wt was approx 192 approx one yr ago, now 173;  Last EGD several yrs ago;  Some nausea, but no vomiting, but constant pain worse with eating;  No change in bowel function, no blood, some radiation to the back as well, overall moderate ongoing now (was severe, assoc with sob per pt when seen in the ER July 16.  Has had good compliacne with new omprazole and sucralfate.  Pt denies chest pain, increased sob or doe, wheezing, orthopnea, PND, increased LE swelling, palpitations, dizziness or syncope. Pt denies new neurological symptoms such as new headache, or facial or extremity weakness or numbness   Pt denies polydipsia, polyuria    Past Medical History  Diagnosis Date  . Hypertension   . Seizures   . Personal history of venous thrombosis and embolism 02/01/2007  . Abdominal pain, generalized 06/23/2007  . Other dysphagia 10/06/2008  . RASH-NONVESICULAR 05/12/2007  . INSOMNIA-SLEEP DISORDER-UNSPEC 03/07/2009  . SEIZURE DISORDER 09/29/2006  . FIBROMYALGIA 02/01/2007  . ROTATOR CUFF SYNDROME, RIGHT 02/01/2007  . BACK PAIN 05/12/2007  . NECK PAIN, LEFT 03/07/2009  . DISC DISEASE, CERVICAL 02/01/2007  . SPONDYLOSIS, LUMBAR 02/01/2007  . PEPTIC ULCER DISEASE 02/01/2007  . GERD 09/29/2006  . BRONCHITIS, ACUTE 02/27/2009  . HYPERTENSION 09/29/2006  . PAIN, CHRONIC NEC 09/29/2006  . SLEEP APNEA, OBSTRUCTIVE, MODERATE 09/29/2006  . DEPRESSION 09/29/2006  . ANXIETY 09/29/2006  . PROTEIN C DEFICIENCY 09/29/2006  . ANEMIA, PROTEIN-DEFICIENCY 09/29/2006   Past Surgical History  Procedure Date  . Appendectomy   . Electrical stimulator in back     reports that he has never smoked. He does not have any smokeless tobacco history on file. He reports that he does not  drink alcohol or use illicit drugs. family history includes Hypertension in his other. Allergies  Allergen Reactions  . Topiramate    Current Outpatient Prescriptions on File Prior to Visit  Medication Sig Dispense Refill  . amLODipine (NORVASC) 5 MG tablet Take 5 mg by mouth daily.        [name] lamoTRIgine (LAMICTAL) 200 MG tablet Take 200 mg by mouth daily.        . traZODone (DESYREL) 50 MG tablet Take 1 tablet (50 mg total) by mouth at bedtime.  90 tablet  1  . zolpidem (AMBIEN) 10 MG tablet Take 1 tablet (10 mg total) by mouth at bedtime as needed for sleep.  90 tablet  1   Review of Systems Review of Systems  Constitutional: Negative for diaphoresis and unexpected weight change.  HENT: Negative for drooling and tinnitus.   Eyes: Negative for photophobia and visual disturbance.  Respiratory: Negative for choking and stridor.   Gastrointestinal: Negative for vomiting and blood in stool.  Genitourinary: Negative for hematuria and decreased urine volume.        Objective:   Physical Exam BP 102/70  Pulse 90  Temp(Src) 98.8 F (37.1 C) (Oral)  Ht 5\' 8"  (1.727 m)  Wt 173 lb 12 oz (78.812 kg)  BMI 26.42 kg/m2  SpO2 97% Physical Exam  VS noted Constitutional: Pt appears well-developed and well-nourished.  HENT: Head: Normocephalic.  Right Ear: External ear normal.  Left Ear:  External ear normal.  Eyes: Conjunctivae and EOM are normal. Pupils are equal, round, and reactive to light.  Neck: Normal range of motion. Neck supple.  Cardiovascular: Normal rate and regular rhythm.   Pulmonary/Chest: Effort normal and breath sounds normal.  Abd:  Soft, mild tender to epigastrium and ruq, non-distended, + BS Neurological: Pt is alert. No cranial nerve deficit.  Skin: Skin is warm. No erythema.  Psychiatric: Pt behavior is normal. Thought content normal. 1+ nervous        Assessment & Plan:

## 2010-10-29 NOTE — Assessment & Plan Note (Signed)
stable overall by hx and exam, most recent data reviewed with pt, and pt to continue medical treatment as before  BP Readings from Last 3 Encounters:  10/29/10 102/70  10/05/10 120/82  03/27/10 122/78

## 2010-10-30 ENCOUNTER — Telehealth: Payer: Self-pay

## 2010-10-30 ENCOUNTER — Encounter: Payer: Self-pay | Admitting: Gastroenterology

## 2010-10-30 ENCOUNTER — Telehealth: Payer: Self-pay | Admitting: Gastroenterology

## 2010-10-30 NOTE — Telephone Encounter (Signed)
Pt called back stating he is having severe pain and wanted to be admitted to the hospital or sent to GI for upper endoscopy TODAY. I advised pt that going to the ER would be his best options as [place] can be done there as requested by JWJ at last OV as well as any other testing to find the cause of abd pain. Pt also advised that labs done were within range but due to severity of pain he should go to ER. Pt agreed and understood.

## 2010-10-30 NOTE — Telephone Encounter (Signed)
Very confusing situation. Pt has been seen in the [place] ER per Dr Raphael Gibney note on 10/29/10 for abdominal pain. Labs by Dr Jonny Ruiz have come back and are negative- CBC, BMET, LIPASE, H. PYLORI, ETC. Pt called back last pm and spoke with [facility] and he was instructed to go to the ER. Pt's visit at [facility] ER consisted of neg. U/S of gallbladder-RUQ and labs were normal. He was informed he had Gastritis, Reflux and was given Dilaudid, phenergan and nexium.  [place] hx includes ECL 04/01/2003 with normal EGD and Colon polyp bx- sent for chart. Flex Sig 10/03/2006 showed mild proctitis but likely normal-bx benign. Pt given appt tomorrow with Willette Cluster, NP at 0915am.

## 2010-10-30 NOTE — Telephone Encounter (Signed)
[facility] Triage Call Report Triage Record Num: [id number] Operator: Tomasita Crumble Patient Name: [name] Call Date & Time: 10/29/2010 8:41:18PM Patient Phone: [phone] PCP: [facility] Patient Gender: Male PCP Fax : [phone] Patient DOB: May 16, 1955 Practice Name: [facility] Reason for Call: Pt. calling about right abdominal pain. Seen by Dr. Jonny Ruiz and instructed to take extra Carafate; states the medication causes his pain to be worse. He is losing weight; 15 lbs. since July 14. Abdominal pain onset 10/03/10; CT scan and bloodwork done. Seen by PCP 8/6 for same; caller states he has something that tastes greasy that he regurgitates after taking Carafate. Pain rated at 9 of 10. He relates heating pad has reduced soreness feeling and pain is in right upper quadrant and it goes through to his back. Advised ED per Abdominal Pain protocol. Caller states he can't continue with the pain; states plan to go to [facility] ED. Protocol(s) Used: Abdominal Pain Recommended Outcome per Protocol: See ED Immediately Reason for Outcome: Unbearable abdominal/pelvic pain Care Advice: ~ Allow the patient to be in a position of comfort. ~ Another adult should drive. ~ Pain medication or laxatives should not be taken until symptoms are evaluated. ~ Do not eat or drink anything until evaluated by provider. Call EMS 911 if signs and symptoms of shock develop (such as unable to stand due to faintness, dizziness, or lightheadedness; new onset of confusion; slow to respond or difficult to awaken; skin is pale, gray, cool, or moist to touch; severe weakness; loss of consciousness). ~ Write down provider's name. List or place the following in a bag for transport with the patient: current prescription and/or nonprescription medications; alternative treatments, therapies and medications; and street drugs. ~ 10/29/2010 8:58:34PM Page 1 of 1 [email or web address]

## 2010-10-31 ENCOUNTER — Encounter: Payer: Self-pay | Admitting: Gastroenterology

## 2010-10-31 ENCOUNTER — Ambulatory Visit (INDEPENDENT_AMBULATORY_CARE_PROVIDER_SITE_OTHER): Payer: Medicare PPO | Admitting: Nurse Practitioner

## 2010-10-31 ENCOUNTER — Other Ambulatory Visit: Payer: Medicare PPO

## 2010-10-31 ENCOUNTER — Encounter: Payer: Self-pay | Admitting: Nurse Practitioner

## 2010-10-31 DIAGNOSIS — R109 Unspecified abdominal pain: Secondary | ICD-10-CM

## 2010-10-31 DIAGNOSIS — R634 Abnormal weight loss: Secondary | ICD-10-CM

## 2010-10-31 DIAGNOSIS — R1010 Upper abdominal pain, unspecified: Secondary | ICD-10-CM

## 2010-10-31 DIAGNOSIS — M549 Dorsalgia, unspecified: Secondary | ICD-10-CM

## 2010-10-31 DIAGNOSIS — K219 Gastro-esophageal reflux disease without esophagitis: Secondary | ICD-10-CM

## 2010-11-01 ENCOUNTER — Encounter: Payer: Self-pay | Admitting: Nurse Practitioner

## 2010-11-01 DIAGNOSIS — R634 Abnormal weight loss: Secondary | ICD-10-CM | POA: Insufficient documentation

## 2010-11-01 DIAGNOSIS — R1010 Upper abdominal pain, unspecified: Secondary | ICD-10-CM | POA: Insufficient documentation

## 2010-11-01 NOTE — Progress Notes (Signed)
11/01/2010 [name] [id number] 1955/11/26   HISTORY OF PRESENT ILLNESS: [name] is a 55 year old white male who was seen in January 2005 ago by Dr. Russella Dar for GERD, dysphasia and diarrhea with hematochezia. EGD at that time was normal. Complete colonoscopy normal except for internal hemorrhoids. Patient referred here today for evaluation of weight loss and postprandial upper abdominal pain present since mid July. Pain is burning in nature, occasionally radiates through to the back. Patient does not take NSAIDS on a regular basis Patient has been seen in the emergency department twice since the pain began in mid July. I've reviewed hospital records from [facility] and on July 17th a CT scan of the abdomen and pelvis with contrast was negative. Labs unremarkable except for minimally elevated ALT of 43.  Patient was given Carafate which did not help his pain and caused headaches. Saw PCP a couple of days ago, repeat labs ordered. Yesterday pain severe again and associated with nausea and vomiting. Patient went back to the emergency department where ultrasound the abdomen was done and unremarkable. Laboratory studies again were normal. In ER yesterday patient was given Nexium, he feels somewhat better today after starting it. It is hard to judge if Nexium really helped as he also took some Dilaudid last night. He has been following low fat diet and avoiding dairy products. Patient indicates that he was taking omeprazole before starting the Nexium given to him in the ER but I am unable to find Omeprazole, or any other PPI on any of his previous medication lists. According to PCP 's note, he has lost about 19 pounds over the last year  Past Medical History  Diagnosis Date  . Hypertension   . Seizures   . Personal history of venous thrombosis and embolism 02/01/2007  . RASH-NONVESICULAR 05/12/2007  . INSOMNIA-SLEEP DISORDER-UNSPEC 03/07/2009  . SEIZURE DISORDER 09/29/2006  . FIBROMYALGIA  02/01/2007  . ROTATOR CUFF SYNDROME, RIGHT 02/01/2007  . BACK PAIN 05/12/2007  . NECK PAIN, LEFT 03/07/2009  . DISC DISEASE, CERVICAL 02/01/2007  . SPONDYLOSIS, LUMBAR 02/01/2007  . PEPTIC ULCER DISEASE 02/01/2007  . GERD 09/29/2006  . BRONCHITIS, ACUTE 02/27/2009  . HYPERTENSION 09/29/2006  . PAIN, CHRONIC NEC 09/29/2006  . SLEEP APNEA, OBSTRUCTIVE, MODERATE 09/29/2006  . DEPRESSION 09/29/2006  . ANXIETY 09/29/2006  . PROTEIN C DEFICIENCY 09/29/2006  . ANEMIA, PROTEIN-DEFICIENCY 09/29/2006    Past Surgical History  Procedure Date  . Appendectomy   . Electrical stimulator in back     reports that he has never smoked. He has never used smokeless tobacco. He reports that he does not drink alcohol or use illicit drugs. family history includes Hypertension in his other. Allergies  Allergen Reactions  . Topiramate       Outpatient Encounter Prescriptions as of 10/31/2010  Medication Sig Dispense Refill  . amLODipine (NORVASC) 5 MG tablet Take 5 mg by mouth daily.        [name] esomeprazole (NEXIUM) 40 MG capsule Take 40 mg by mouth daily before breakfast.        . lamoTRIgine (LAMICTAL) 200 MG tablet Take 200 mg by mouth daily.        [name] oxymorphone (OPANA ER) 15 MG 12 hr tablet Take 15 mg by mouth 2 (two) times daily.        . traZODone (DESYREL) 50 MG tablet Take 1 tablet (50 mg total) by mouth at bedtime.  90 tablet  1  . zolpidem (AMBIEN) 10 MG tablet Take 1  tablet (10 mg total) by mouth at bedtime as needed for sleep.  90 tablet  1  . DISCONTD: omeprazole (PRILOSEC) 20 MG capsule Take 1 capsule (20 mg total) by mouth 2 (two) times daily. OTC  60 capsule  11  . DISCONTD: sucralfate (CARAFATE) 1 G tablet 1 by mouth four times per day (1 hour before meals and at bedtime).          REVIEW OF SYSTEMS  : All other systems reviewed and negative except where noted in the History of Present Illness.   PHYSICAL EXAM: General: Well developed white male in no acute distress Head: Normocephalic and  atraumatic Eyes:  sclerae anicteric,conjunctive pink. Ears: Normal auditory acuity Mouth: No deformity or lesions Neck: Supple, no masses.  Lungs: Clear throughout to auscultation Heart: Regular rate and rhythm; no murmurs heard Abdomen: Soft, non distended, nontender. No masses or hepatomegaly noted. Normal Bowel sounds Rectal: not done Musculoskeletal: Symmetrical with no gross deformities  Skin: No lesions on visible extremities Extremities: No edema or deformities noted Neurological: Alert oriented x 4, grossly nonfocal Cervical Nodes:  No significant cervical adenopathy Psychological:  Alert and cooperative. Normal mood and affect  ASSESSMENT AND PLAN;

## 2010-11-01 NOTE — Assessment & Plan Note (Signed)
Primary care physician's office note the patient has lost about 19 pounds over the last year. His weight loss has not been intentional. CT scan of the abdomen and pelvis and basic laboratory studies done for workup of upper abdominal pain and been unremarkable. Patient has had a colonoscopy and is up-to-date on his screening. Etiology of weight loss not clear but not necessarily an ominous sign. Further evaluation at time of upper endoscopy.

## 2010-11-01 NOTE — Assessment & Plan Note (Addendum)
Upper abdominal pain of one month's duration. Unrevealing CT scan of the abdomen and pelvis, ultrasound and laboratory studies. Rule out peptic ulcer disease, distal esophagitis. Gallbladder disease seems less likely with negative ultrasound but it is not excluded. Continue daily Nexium. For further evaluation patient will be scheduled for EGD.  The benefits, risks, and potential complications of EGD with possible biopsies were discussed with the patient and he agrees to proceed. The patient is on chronic pain medications which may make sedation more difficult so will arrange for procedure to be done under Propofol

## 2010-11-01 NOTE — Progress Notes (Signed)
Agree with Ms. Guenther's assessment and plan. Carl E. Gessner, MD, FACG   
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
## 2010-11-06 ENCOUNTER — Telehealth: Payer: Self-pay | Admitting: Gastroenterology

## 2010-11-06 NOTE — Telephone Encounter (Signed)
Patient states that his abdominal pain is worse.  He reports that he feels like someone "punched me in the stomach".  Worse after meals.  He denies rectal bleeding, he denies constipation.  He is also asking for something for the Pain.  Gunnar Fusi please advise what he can take for the discomfort? Can we try levsin or dicyclomine??

## 2010-11-07 NOTE — Telephone Encounter (Signed)
Yes he can try Bentyl 10mg  bid. He is supposed to be getting EGD for further evaluation.

## 2010-11-08 MED ORDER — ESOMEPRAZOLE MAGNESIUM 40 MG PO CPDR: 40.0000 mg | DELAYED_RELEASE_CAPSULE | Freq: Every day | ORAL | Status: DC

## 2010-11-08 MED ORDER — DICYCLOMINE HCL 10 MG PO CAPS: 10.0000 mg | ORAL_CAPSULE | Freq: Two times a day (BID) | ORAL | Status: DC

## 2010-11-08 NOTE — Telephone Encounter (Signed)
Patient advised.

## 2010-11-16 ENCOUNTER — Ambulatory Visit (AMBULATORY_SURGERY_CENTER): Payer: Medicare PPO | Admitting: Gastroenterology

## 2010-11-16 ENCOUNTER — Encounter: Payer: Self-pay | Admitting: Gastroenterology

## 2010-11-16 VITALS — BP 121/65 | HR 82 | Temp 97.1°F | Resp 20 | Ht 67.0 in | Wt 176.0 lb

## 2010-11-16 DIAGNOSIS — R634 Abnormal weight loss: Secondary | ICD-10-CM

## 2010-11-16 DIAGNOSIS — K2970 Gastritis, unspecified, without bleeding: Secondary | ICD-10-CM

## 2010-11-16 DIAGNOSIS — K2990 Gastroduodenitis, unspecified, without bleeding: Secondary | ICD-10-CM

## 2010-11-16 DIAGNOSIS — K219 Gastro-esophageal reflux disease without esophagitis: Secondary | ICD-10-CM

## 2010-11-16 DIAGNOSIS — K2940 Chronic atrophic gastritis without bleeding: Secondary | ICD-10-CM

## 2010-11-16 DIAGNOSIS — R1013 Epigastric pain: Secondary | ICD-10-CM

## 2010-11-16 MED ORDER — OMEPRAZOLE 40 MG PO CPDR: 40.0000 mg | DELAYED_RELEASE_CAPSULE | Freq: Two times a day (BID) | ORAL | Status: DC

## 2010-11-16 MED ORDER — OMEPRAZOLE 40 MG PO CPDR: 40.0000 mg | DELAYED_RELEASE_CAPSULE | Freq: Every day | ORAL | Status: DC

## 2010-11-16 MED ORDER — SODIUM CHLORIDE 0.9 % IV SOLN: 500.0000 mL | INTRAVENOUS | Status: DC

## 2010-11-16 NOTE — Patient Instructions (Signed)
See the picture page for your findings from your exam today.  Follow the green and blue discharge instruction sheets the rest of the day.  Please call if any questions or concerns. Resume your prior medications today. Increase the Prilosec to 2 x per day per Dr. Russella Dar.  The new rx was sent to [facility].

## 2010-11-16 NOTE — Progress Notes (Signed)
No complaints on discharge.  MAW 

## 2010-11-19 ENCOUNTER — Telehealth: Payer: Self-pay

## 2010-11-19 NOTE — Telephone Encounter (Signed)
I called the pt's hm #, no answer.  I called the cell #, no ID on the voice mail.  No message left. MAW

## 2010-11-20 ENCOUNTER — Other Ambulatory Visit: Payer: Medicare PPO | Admitting: Gastroenterology

## 2010-11-20 ENCOUNTER — Encounter: Payer: Self-pay | Admitting: Gastroenterology

## 2010-11-21 ENCOUNTER — Other Ambulatory Visit: Payer: Self-pay | Admitting: Gastroenterology

## 2010-11-22 ENCOUNTER — Telehealth: Payer: Self-pay | Admitting: Gastroenterology

## 2010-11-22 ENCOUNTER — Other Ambulatory Visit: Payer: Self-pay

## 2010-11-22 MED ORDER — SUCRALFATE 1 G PO TABS: 1.0000 g | ORAL_TABLET | Freq: Four times a day (QID) | ORAL | Status: DC

## 2010-11-22 MED ORDER — OMEPRAZOLE 40 MG PO CPDR: 40.0000 mg | DELAYED_RELEASE_CAPSULE | Freq: Two times a day (BID) | ORAL | Status: DC

## 2010-11-22 MED ORDER — ESOMEPRAZOLE MAGNESIUM 40 MG PO CPDR: 40.0000 mg | DELAYED_RELEASE_CAPSULE | Freq: Two times a day (BID) | ORAL | Status: DC

## 2010-11-22 NOTE — Telephone Encounter (Signed)
Sent a new PPI to the pharmacy.

## 2010-11-22 NOTE — Telephone Encounter (Signed)
Already sent the prescription refill for Carafate to the pharmacy.

## 2010-11-22 NOTE — Telephone Encounter (Signed)
Both prescriptions have sent to the pharmacy.

## 2010-11-27 ENCOUNTER — Telehealth: Payer: Self-pay | Admitting: Gastroenterology

## 2010-11-28 ENCOUNTER — Ambulatory Visit: Payer: Medicare PPO | Admitting: Gastroenterology

## 2010-11-28 MED ORDER — OMEPRAZOLE 40 MG PO CPDR: 40.0000 mg | DELAYED_RELEASE_CAPSULE | Freq: Every day | ORAL | Status: DC

## 2010-11-28 NOTE — Telephone Encounter (Signed)
Pt wants the results from his Upper Endoscopy and pathology. Pt informed of his results and treatment. Pt states his pharmacy told him that his insurance will not cover bid dosing for any PPI's. Pt wants the omeprazole sent to his pharmacy for qd dosing. Informed patient to take Prilosec OTC with the prescription strength omeprazole to increase the strength if needed. Pt states he also would like to continue Carafate because it really helped with his symptoms. I told patient to take it as needed for breakthrough symptoms. Pt agreed and verbalized understanding.

## 2010-12-05 ENCOUNTER — Other Ambulatory Visit: Payer: Self-pay | Admitting: Internal Medicine

## 2010-12-12 LAB — DIFFERENTIAL
Lymphocytes Relative: 29
Lymphs Abs: 1.7
Neutrophils Relative %: 57

## 2010-12-12 LAB — ABO/RH: ABO/RH(D): A POS

## 2010-12-12 LAB — COMPREHENSIVE METABOLIC PANEL
BUN: 16
CO2: 27
Calcium: 8.5
Creatinine, Ser: 1.19
GFR calc Af Amer: >60
GFR calc non Af Amer: >60
Glucose, Bld: 140 — ABNORMAL HIGH

## 2010-12-12 LAB — PROTIME-INR
INR: 3.5 — ABNORMAL HIGH
Prothrombin Time: 36.7 — ABNORMAL HIGH

## 2010-12-12 LAB — CBC
Hemoglobin: 13.4
MCHC: 33.3
MCV: 85.3
RBC: 4.72

## 2010-12-12 LAB — POCT I-STAT 3, ART BLOOD GAS (G3+)
O2 Saturation: 100
Patient temperature: 98
pCO2 arterial: 50.2 — ABNORMAL HIGH
pH, Arterial: 7.367

## 2010-12-12 LAB — TYPE AND SCREEN: ABO/RH(D): A POS

## 2010-12-13 LAB — URINALYSIS, ROUTINE W REFLEX MICROSCOPIC
Glucose, UA: NEGATIVE
Ketones, ur: NEGATIVE
Specific Gravity, Urine: 1.028
pH: 6

## 2010-12-13 LAB — URINE CULTURE
Colony Count: NO GROWTH
Culture: NO GROWTH

## 2010-12-13 LAB — DIFFERENTIAL
Eosinophils Absolute: 0.3
Eosinophils Relative: 4
Lymphocytes Relative: 21
Lymphs Abs: 1.6
Monocytes Absolute: 0.5
Monocytes Relative: 6

## 2010-12-13 LAB — COMPREHENSIVE METABOLIC PANEL
ALT: 33
ALT: 33
AST: 23
AST: 25
Albumin: 3.1 — ABNORMAL LOW
CO2: 27
CO2: 28
Calcium: 8.1 — ABNORMAL LOW
Calcium: 8.2 — ABNORMAL LOW
Chloride: 101
Creatinine, Ser: 1.15
Creatinine, Ser: 1.21
GFR calc Af Amer: >60
GFR calc Af Amer: >60
GFR calc non Af Amer: >60
Glucose, Bld: 85
Sodium: 133 — ABNORMAL LOW
Sodium: 134 — ABNORMAL LOW
Total Bilirubin: 0.7
Total Protein: 6.7

## 2010-12-13 LAB — CBC
Hemoglobin: 12.2 — ABNORMAL LOW
MCHC: 33.6
MCHC: 33.7
MCV: 84.1
Platelets: 208
RBC: 4.16 — ABNORMAL LOW
RBC: 4.33
RDW: 16.5 — ABNORMAL HIGH
WBC: 6.7

## 2010-12-13 LAB — T4, FREE: Free T4: 0.87 — ABNORMAL LOW

## 2010-12-13 LAB — PROTIME-INR
INR: 3 — ABNORMAL HIGH
Prothrombin Time: 31.9 — ABNORMAL HIGH
Prothrombin Time: 36.6 — ABNORMAL HIGH

## 2010-12-14 LAB — DIFFERENTIAL
Basophils Absolute: 0.1
Basophils Relative: 1
Lymphocytes Relative: 33
Monocytes Absolute: 0.6
Monocytes Relative: 9
Neutro Abs: 3.3
Neutrophils Relative %: 52

## 2010-12-14 LAB — LIPASE, BLOOD: Lipase: 19

## 2010-12-14 LAB — POCT CARDIAC MARKERS
Operator id: 2725
Troponin i, poc: <0.05

## 2010-12-14 LAB — CBC
HCT: 40.1
MCHC: 34
MCV: 82.7
Platelets: 232
RDW: 16.4 — ABNORMAL HIGH

## 2010-12-14 LAB — PROTIME-INR
INR: 2.4 — ABNORMAL HIGH
Prothrombin Time: 26.6 — ABNORMAL HIGH
Prothrombin Time: 30.1 — ABNORMAL HIGH

## 2010-12-14 LAB — URINE CULTURE: Colony Count: NO GROWTH

## 2010-12-14 LAB — URINALYSIS, ROUTINE W REFLEX MICROSCOPIC
Nitrite: NEGATIVE
Specific Gravity, Urine: 1.021
Urobilinogen, UA: 0.2
pH: 6.5

## 2010-12-14 LAB — COMPREHENSIVE METABOLIC PANEL
Albumin: 3.6
BUN: 11
Creatinine, Ser: 1.05
Glucose, Bld: 90
Total Protein: 7.5

## 2010-12-14 LAB — LAMOTRIGINE LEVEL: Lamotrigine Lvl: 8.3

## 2010-12-17 LAB — URINALYSIS, ROUTINE W REFLEX MICROSCOPIC
Bilirubin Urine: NEGATIVE
Nitrite: NEGATIVE
Specific Gravity, Urine: 1.026
Urobilinogen, UA: 1
pH: 7.5

## 2010-12-17 LAB — BASIC METABOLIC PANEL
CO2: 27
Chloride: 103
Creatinine, Ser: 1.23
GFR calc Af Amer: >60
Glucose, Bld: 98

## 2010-12-17 LAB — CBC
HCT: 37.5 — ABNORMAL LOW
Hemoglobin: 12.6 — ABNORMAL LOW
MCV: 83.7
MCV: 84.2
Platelets: 187
RBC: 4.61
RDW: 16.8 — ABNORMAL HIGH
WBC: 6.7
WBC: 6.8

## 2010-12-17 LAB — DIFFERENTIAL
Lymphocytes Relative: 25
Lymphs Abs: 1.7
Monocytes Relative: 6
Neutrophils Relative %: 66

## 2010-12-17 LAB — I-STAT 8, (EC8 V) (CONVERTED LAB)
Acid-Base Excess: 2
Bicarbonate: 25.7 — ABNORMAL HIGH
Glucose, Bld: 117 — ABNORMAL HIGH
Hemoglobin: 14.3
Operator id: 270651
Potassium: 4.1
Sodium: 136
TCO2: 27

## 2010-12-17 LAB — OCCULT BLOOD X 1 CARD TO LAB, STOOL: Fecal Occult Bld: POSITIVE

## 2010-12-17 LAB — PROTIME-INR
INR: 2.4 — ABNORMAL HIGH
Prothrombin Time: 26.7 — ABNORMAL HIGH

## 2010-12-17 LAB — HEMOGLOBIN AND HEMATOCRIT, BLOOD: HCT: 37.6 — ABNORMAL LOW

## 2010-12-18 LAB — PROTIME-INR
INR: 2.5 — ABNORMAL HIGH
INR: 3 — ABNORMAL HIGH
Prothrombin Time: 26.6 — ABNORMAL HIGH
Prothrombin Time: 28 — ABNORMAL HIGH
Prothrombin Time: 31.8 — ABNORMAL HIGH
Prothrombin Time: 32.3 — ABNORMAL HIGH

## 2010-12-18 LAB — COMPREHENSIVE METABOLIC PANEL
ALT: 40
AST: 26
Albumin: 3.6
Alkaline Phosphatase: 104
CO2: 25
Chloride: 98
Creatinine, Ser: 1.2
GFR calc Af Amer: >60
GFR calc non Af Amer: >60
Potassium: 4.5
Sodium: 134 — ABNORMAL LOW
Total Bilirubin: 0.3

## 2010-12-18 LAB — DIFFERENTIAL
Basophils Absolute: 0
Basophils Absolute: 0
Basophils Relative: 1
Basophils Relative: 1
Eosinophils Absolute: 0.1
Eosinophils Relative: 3
Eosinophils Relative: 4
Lymphocytes Relative: 35
Monocytes Absolute: 0.5
Neutro Abs: 2.3

## 2010-12-18 LAB — URINALYSIS, ROUTINE W REFLEX MICROSCOPIC
Bilirubin Urine: NEGATIVE
Glucose, UA: NEGATIVE
Hgb urine dipstick: NEGATIVE
Protein, ur: NEGATIVE
Protein, ur: NEGATIVE
Urobilinogen, UA: 0.2
Urobilinogen, UA: 0.2

## 2010-12-18 LAB — CBC
HCT: 38.5 — ABNORMAL LOW
HCT: 39.8
Hemoglobin: 13.3
MCHC: 33.5
MCHC: 33.8
Platelets: 228
RDW: 16.5 — ABNORMAL HIGH
RDW: 17.2 — ABNORMAL HIGH

## 2010-12-18 LAB — HEPATIC FUNCTION PANEL
ALT: 36
Total Protein: 6.8

## 2010-12-18 LAB — AMYLASE: Amylase: 46

## 2010-12-18 LAB — LIPASE, BLOOD: Lipase: 18

## 2010-12-18 LAB — OCCULT BLOOD X 1 CARD TO LAB, STOOL: Fecal Occult Bld: POSITIVE

## 2010-12-25 LAB — CBC
HCT: 44.8
MCHC: 33.2
MCV: 86.5
Platelets: 189
RDW: 15.2

## 2010-12-25 LAB — BASIC METABOLIC PANEL
BUN: 9
CO2: 30
Chloride: 101
Creatinine, Ser: 1.12
Glucose, Bld: 86
Potassium: 4

## 2010-12-25 LAB — PROTIME-INR: Prothrombin Time: 13.1

## 2010-12-26 LAB — CBC
HCT: 40.8
Hemoglobin: 13.6
MCV: 85.3
Platelets: 211
RDW: 15.9 — ABNORMAL HIGH

## 2010-12-26 LAB — POCT I-STAT, CHEM 8
Calcium, Ion: 1.17
Chloride: 103
Creatinine, Ser: 1.4
Glucose, Bld: 106 — ABNORMAL HIGH
HCT: 41
Hemoglobin: 13.9
Potassium: 3.7

## 2010-12-26 LAB — PROTIME-INR: Prothrombin Time: 13.3

## 2010-12-26 LAB — DIFFERENTIAL
Basophils Absolute: 0
Eosinophils Absolute: 0.5
Eosinophils Relative: 7 — ABNORMAL HIGH
Lymphocytes Relative: 26
Monocytes Absolute: 0.5

## 2010-12-31 LAB — DIFFERENTIAL
Eosinophils Relative: 5
Lymphocytes Relative: 25
Lymphs Abs: 1.5
Monocytes Absolute: 0.5
Monocytes Relative: 9
Neutro Abs: 3.8

## 2010-12-31 LAB — CBC
HCT: 40.1
Hemoglobin: 13.7
RBC: 4.75

## 2010-12-31 LAB — I-STAT 8, (EC8 V) (CONVERTED LAB)
Acid-Base Excess: 1
Chloride: 106
HCT: 43
Hemoglobin: 14.6
Operator id: 272551
Potassium: 3.5
Sodium: 140
TCO2: 28
pH, Ven: 7.382 — ABNORMAL HIGH

## 2010-12-31 LAB — PROTIME-INR
INR: 2.9 — ABNORMAL HIGH
Prothrombin Time: 31.1 — ABNORMAL HIGH

## 2010-12-31 LAB — POCT I-STAT CREATININE
Creatinine, Ser: 1.2
Operator id: 272551

## 2011-01-01 LAB — I-STAT 8, (EC8 V) (CONVERTED LAB)
BUN: 16
Bicarbonate: 28.2 — ABNORMAL HIGH
Glucose, Bld: 112 — ABNORMAL HIGH
Hemoglobin: 13.9
Sodium: 139
pH, Ven: 7.368 — ABNORMAL HIGH

## 2011-01-01 LAB — PROTIME-INR
INR: 2.6 — ABNORMAL HIGH
INR: 3.3 — ABNORMAL HIGH
Prothrombin Time: 31.2 — ABNORMAL HIGH

## 2011-01-01 LAB — SEDIMENTATION RATE: Sed Rate: 15

## 2011-01-01 LAB — CK TOTAL AND CKMB (NOT AT ARMC)
CK, MB: 1.5
Relative Index: INVALID

## 2011-01-01 LAB — POCT I-STAT CREATININE: Creatinine, Ser: 1.2

## 2011-01-02 LAB — CBC
Hemoglobin: 13.5
Hemoglobin: 13.5
MCHC: 34.2
MCHC: 33.9
MCV: 85.5
RBC: 4.62
RBC: 4.61
WBC: 5.2

## 2011-01-02 LAB — BASIC METABOLIC PANEL
CO2: 29
Calcium: 9
GFR calc Af Amer: >60
Sodium: 139

## 2011-01-02 LAB — DIFFERENTIAL
Basophils Relative: 0
Eosinophils Absolute: 0.3
Lymphocytes Relative: 29
Lymphs Abs: 1.5
Monocytes Absolute: 0.6
Monocytes Relative: 9
Monocytes Relative: 12 — ABNORMAL HIGH
Neutro Abs: 2.8
Neutrophils Relative %: 62

## 2011-01-02 LAB — PROTIME-INR
INR: 4.5 — ABNORMAL HIGH
INR: 4.6 — ABNORMAL HIGH
Prothrombin Time: 45.3 — ABNORMAL HIGH

## 2011-01-02 LAB — COMPREHENSIVE METABOLIC PANEL
ALT: 45
Alkaline Phosphatase: 97
CO2: 28
GFR calc non Af Amer: >60
Glucose, Bld: 111 — ABNORMAL HIGH
Potassium: 3.6
Sodium: 136

## 2011-01-02 LAB — HEMOGLOBIN AND HEMATOCRIT, BLOOD: HCT: 38.5 — ABNORMAL LOW

## 2011-01-03 LAB — CBC
HCT: 38.3 — ABNORMAL LOW
Hemoglobin: 13.1
MCHC: 34.1
MCV: 85.2
RBC: 4.5

## 2011-01-03 LAB — COMPREHENSIVE METABOLIC PANEL
ALT: 39
CO2: 27
Calcium: 8.5
Creatinine, Ser: 1.14
GFR calc non Af Amer: >60
Glucose, Bld: 97
Sodium: 137

## 2011-01-03 LAB — RAPID URINE DRUG SCREEN, HOSP PERFORMED
Amphetamines: NOT DETECTED
Barbiturates: NOT DETECTED
Tetrahydrocannabinol: NOT DETECTED

## 2011-01-03 LAB — URINALYSIS, ROUTINE W REFLEX MICROSCOPIC
Bilirubin Urine: NEGATIVE
Hgb urine dipstick: NEGATIVE
Ketones, ur: NEGATIVE
Protein, ur: NEGATIVE
Urobilinogen, UA: 0.2

## 2011-01-03 LAB — PROTIME-INR
INR: 3.1 — ABNORMAL HIGH
Prothrombin Time: 32.9 — ABNORMAL HIGH
Prothrombin Time: 33.2 — ABNORMAL HIGH

## 2011-01-03 LAB — DIFFERENTIAL
Eosinophils Absolute: 0.3
Lymphs Abs: 1.7
Neutrophils Relative %: 57

## 2011-01-03 LAB — PHENYTOIN LEVEL, TOTAL: Phenytoin Lvl: <2.5 — ABNORMAL LOW

## 2011-01-03 LAB — LAMOTRIGINE LEVEL: Lamotrigine Lvl: 14.6 ug/mL (ref 4.0–18.0)

## 2011-01-07 LAB — CLOSTRIDIUM DIFFICILE EIA
C difficile Toxins A+B, EIA: NEGATIVE
C difficile Toxins A+B, EIA: NEGATIVE

## 2011-01-07 LAB — DIFFERENTIAL
Basophils Absolute: 0
Basophils Absolute: 0
Basophils Relative: 0
Basophils Relative: 0
Eosinophils Absolute: 0.2
Eosinophils Relative: 5
Eosinophils Relative: 4
Lymphocytes Relative: 22
Lymphs Abs: 1.5
Monocytes Absolute: 0.4
Neutrophils Relative %: 57

## 2011-01-07 LAB — URINALYSIS, ROUTINE W REFLEX MICROSCOPIC
Bilirubin Urine: NEGATIVE
Glucose, UA: NEGATIVE
Hgb urine dipstick: NEGATIVE
Hgb urine dipstick: NEGATIVE
Ketones, ur: NEGATIVE
Ketones, ur: NEGATIVE
Nitrite: NEGATIVE
Protein, ur: NEGATIVE
Specific Gravity, Urine: 1.02
Urobilinogen, UA: 0.2

## 2011-01-07 LAB — CBC
HCT: 38.8 — ABNORMAL LOW
Hemoglobin: 13.1
MCHC: 34.2
MCHC: 33.9
Platelets: 217
Platelets: 225
RBC: 4.46
RDW: 14.8 — ABNORMAL HIGH
RDW: 14.9 — ABNORMAL HIGH
WBC: 5.2
WBC: 5.3

## 2011-01-07 LAB — POCT I-STAT CREATININE
Creatinine, Ser: 1.3
Operator id: 288331

## 2011-01-07 LAB — I-STAT 8, (EC8 V) (CONVERTED LAB)
Acid-Base Excess: 3 — ABNORMAL HIGH
BUN: 15
Bicarbonate: 30.3 — ABNORMAL HIGH
Chloride: 101
Glucose, Bld: 104 — ABNORMAL HIGH
HCT: 42
Hemoglobin: 14.3
Operator id: 288331
Potassium: 3.3 — ABNORMAL LOW
Sodium: 139
TCO2: 32
pCO2, Ven: 54.9 — ABNORMAL HIGH
pH, Ven: 7.349 — ABNORMAL HIGH

## 2011-01-07 LAB — COMPREHENSIVE METABOLIC PANEL
ALT: 40
AST: 31
Alkaline Phosphatase: 78
CO2: 30
Calcium: 8.9
Chloride: 104
GFR calc Af Amer: >60
GFR calc non Af Amer: 57 — ABNORMAL LOW
Glucose, Bld: 69 — ABNORMAL LOW
Potassium: 3.6
Sodium: 140
Total Bilirubin: 0.9

## 2011-01-07 LAB — PROTIME-INR
INR: 1.9 — ABNORMAL HIGH
INR: 2.1 — ABNORMAL HIGH
INR: 3.1 — ABNORMAL HIGH
Prothrombin Time: 24.6 — ABNORMAL HIGH
Prothrombin Time: 33.6 — ABNORMAL HIGH

## 2011-01-07 LAB — GLUTAMIC ACID DECARBOXYLASE AUTO ABS: Glutamic Acid Decarb Ab: <1 U/mL (ref <1.5)

## 2011-01-07 LAB — BASIC METABOLIC PANEL
BUN: 7
Calcium: 8.9
Creatinine, Ser: 1.27
GFR calc non Af Amer: >60
Glucose, Bld: 105 — ABNORMAL HIGH
Potassium: 4.3

## 2011-01-07 LAB — APTT: aPTT: 42 — ABNORMAL HIGH

## 2011-01-07 LAB — OCCULT BLOOD X 1 CARD TO LAB, STOOL
Fecal Occult Bld: NEGATIVE
Fecal Occult Bld: NEGATIVE

## 2011-01-08 LAB — PROTIME-INR
INR: 1.3
INR: 1.5
INR: 1.6 — ABNORMAL HIGH
INR: 1.7 — ABNORMAL HIGH
INR: 3.3 — ABNORMAL HIGH
INR: 3.9 — ABNORMAL HIGH
Prothrombin Time: 16.5 — ABNORMAL HIGH
Prothrombin Time: 18.3 — ABNORMAL HIGH
Prothrombin Time: 19.4 — ABNORMAL HIGH
Prothrombin Time: 19.5 — ABNORMAL HIGH
Prothrombin Time: 20.9 — ABNORMAL HIGH
Prothrombin Time: 41.1 — ABNORMAL HIGH

## 2011-01-08 LAB — BASIC METABOLIC PANEL
BUN: 10
BUN: 19
CO2: 27
CO2: 28
Calcium: 8.3 — ABNORMAL LOW
Calcium: 8.9
Creatinine, Ser: 1.32
Creatinine, Ser: 1.34
Creatinine, Ser: 1.41
GFR calc Af Amer: >60
GFR calc Af Amer: >60
GFR calc Af Amer: >60
GFR calc non Af Amer: 53 — ABNORMAL LOW
GFR calc non Af Amer: 57 — ABNORMAL LOW
GFR calc non Af Amer: 58 — ABNORMAL LOW
Glucose, Bld: 96
Potassium: 3.5
Sodium: 138
Sodium: 139

## 2011-01-08 LAB — COMPREHENSIVE METABOLIC PANEL
ALT: 22
Alkaline Phosphatase: 70
CO2: 29
GFR calc non Af Amer: >60
Glucose, Bld: 103 — ABNORMAL HIGH
Potassium: 3.4 — ABNORMAL LOW
Sodium: 140

## 2011-01-08 LAB — URINALYSIS, ROUTINE W REFLEX MICROSCOPIC
Bilirubin Urine: NEGATIVE
Glucose, UA: NEGATIVE
Protein, ur: NEGATIVE
Urobilinogen, UA: 0.2

## 2011-01-08 LAB — CBC
HCT: 35.5 — ABNORMAL LOW
Hemoglobin: 12.4 — ABNORMAL LOW
MCHC: 34.6
Platelets: 166
Platelets: 167
RBC: 4.03 — ABNORMAL LOW
RBC: 4.07 — ABNORMAL LOW
RDW: 15.2 — ABNORMAL HIGH
WBC: 12.4 — ABNORMAL HIGH

## 2011-01-08 LAB — URINE CULTURE

## 2011-01-08 LAB — CLOSTRIDIUM DIFFICILE EIA

## 2011-01-08 LAB — CULTURE, BLOOD (ROUTINE X 2)
Culture: NO GROWTH
Culture: NO GROWTH

## 2011-01-08 LAB — DIFFERENTIAL
Lymphocytes Relative: 11 — ABNORMAL LOW
Lymphs Abs: 1.3
Monocytes Relative: 6
Neutro Abs: 10.3 — ABNORMAL HIGH
Neutrophils Relative %: 83 — ABNORMAL HIGH

## 2011-01-08 LAB — URINE MICROSCOPIC-ADD ON

## 2011-01-08 LAB — SEDIMENTATION RATE: Sed Rate: 55 — ABNORMAL HIGH

## 2011-01-08 LAB — ETHANOL: Alcohol, Ethyl (B): <5

## 2011-01-08 LAB — HEPATIC FUNCTION PANEL
AST: 37
Albumin: 3.2 — ABNORMAL LOW
Total Protein: 6.4

## 2011-01-08 LAB — VITAMIN B12: Vitamin B-12: 565 (ref 211–911)

## 2011-01-08 LAB — APTT: aPTT: 77 — ABNORMAL HIGH

## 2011-01-09 ENCOUNTER — Encounter: Payer: Self-pay | Admitting: Internal Medicine

## 2011-01-09 ENCOUNTER — Ambulatory Visit (INDEPENDENT_AMBULATORY_CARE_PROVIDER_SITE_OTHER): Payer: Medicare PPO | Admitting: Internal Medicine

## 2011-01-09 DIAGNOSIS — Z0000 Encounter for general adult medical examination without abnormal findings: Secondary | ICD-10-CM

## 2011-01-09 DIAGNOSIS — I10 Essential (primary) hypertension: Secondary | ICD-10-CM

## 2011-01-09 DIAGNOSIS — J209 Acute bronchitis, unspecified: Secondary | ICD-10-CM | POA: Insufficient documentation

## 2011-01-09 DIAGNOSIS — R062 Wheezing: Secondary | ICD-10-CM

## 2011-01-09 MED ORDER — PREDNISONE 10 MG PO TABS: ORAL_TABLET | ORAL | Status: DC

## 2011-01-09 MED ORDER — HYOSCYAMINE SULFATE 0.125 MG SL SUBL: 0.1250 mg | SUBLINGUAL_TABLET | SUBLINGUAL | Status: DC | PRN

## 2011-01-09 MED ORDER — AZITHROMYCIN 250 MG PO TABS: ORAL_TABLET | ORAL | Status: AC

## 2011-01-09 MED ORDER — HYDROCODONE-HOMATROPINE 5-1.5 MG/5ML PO SYRP: 5.0000 mL | ORAL_SOLUTION | Freq: Four times a day (QID) | ORAL | Status: AC | PRN

## 2011-01-09 NOTE — Patient Instructions (Addendum)
Take all new medications as prescribed Continue all other medications as before Please return in 3 mo with Lab testing done 3-5 days before  

## 2011-01-09 NOTE — Assessment & Plan Note (Signed)
Mild, for predpack asd,  to f/u any worsening symptoms or concerns  

## 2011-01-09 NOTE — Assessment & Plan Note (Signed)
Mild to mod, for antibx course,  to f/u any worsening symptoms or concerns 

## 2011-01-09 NOTE — Progress Notes (Signed)
Subjective:    Patient ID: [name], male    DOB: May 31, 1955, 55 y.o.   MRN: [id number]  HPI  Here with acute onset mild to mod 2-3 days ST, HA, general weakness and malaise, with prod cough greenish sputum, but Pt denies chest pain, increased sob or doe, wheezing, orthopnea, PND, increased LE swelling, palpitations, dizziness or syncope, except for mild onset wheezing and sob today.  Pt denies new neurological symptoms such as new headache, or facial or extremity weakness or numbness   Pt denies polydipsia, polyuria.   Pt denies fever, wt loss, night sweats, loss of appetite, or other constitutional symptoms except for the above Past Medical History  Diagnosis Date  . Hypertension   . Seizures   . Personal history of venous thrombosis and embolism 02/01/2007  . RASH-NONVESICULAR 05/12/2007  . INSOMNIA-SLEEP DISORDER-UNSPEC 03/07/2009  . SEIZURE DISORDER 09/29/2006  . FIBROMYALGIA 02/01/2007  . ROTATOR CUFF SYNDROME, RIGHT 02/01/2007  . BACK PAIN 05/12/2007  . NECK PAIN, LEFT 03/07/2009  . DISC DISEASE, CERVICAL 02/01/2007  . SPONDYLOSIS, LUMBAR 02/01/2007  . PEPTIC ULCER DISEASE 02/01/2007  . GERD 09/29/2006  . BRONCHITIS, ACUTE 02/27/2009  . HYPERTENSION 09/29/2006  . PAIN, CHRONIC NEC 09/29/2006  . SLEEP APNEA, OBSTRUCTIVE, MODERATE 09/29/2006  . DEPRESSION 09/29/2006  . ANXIETY 09/29/2006  . PROTEIN C DEFICIENCY 09/29/2006  . ANEMIA, PROTEIN-DEFICIENCY 09/29/2006   Past Surgical History  Procedure Date  . Appendectomy   . Electrical stimulator in back     reports that he has never smoked. He has never used smokeless tobacco. He reports that he does not drink alcohol or use illicit drugs. family history includes Hypertension in his other. Allergies  Allergen Reactions  . Topiramate    Current Outpatient Prescriptions on File Prior to Visit  Medication Sig Dispense Refill  . amLODipine (NORVASC) 5 MG tablet TAKE 1 TABLET ONE TIME DAILY  90 tablet  1  . HYDROmorphone (DILAUDID) 2 MG  tablet Take 2 mg by mouth every 6 (six) hours as needed.        . lamoTRIgine (LAMICTAL) 200 MG tablet Take 200 mg by mouth daily.        [name] omeprazole (PRILOSEC) 40 MG capsule Take 1 capsule (40 mg total) by mouth daily.  30 capsule  11  . traZODone (DESYREL) 50 MG tablet Take 50 mg by mouth at bedtime.        [name] zolpidem (AMBIEN) 10 MG tablet Take 10 mg by mouth at bedtime as needed.         Review of Systems Review of Systems  Constitutional: Negative for diaphoresis and unexpected weight change.  HENT: Negative for drooling and tinnitus.   Eyes: Negative for photophobia and visual disturbance.  Respiratory: Negative for choking and stridor.   Gastrointestinal: Negative for vomiting and blood in stool.  Genitourinary: Negative for hematuria and decreased urine volume.    Objective:   Physical Exam BP 112/68  Pulse 81  Temp(Src) 98.8 F (37.1 C) (Oral)  Ht 5\' 5"  (1.651 m)  Wt 175 lb 1.9 oz (79.434 kg)  BMI 29.14 kg/m2  SpO2 95% Physical Exam  VS noted, mild ill appearing Constitutional: Pt appears well-developed and well-nourished.  HENT: Head: Normocephalic.  Right Ear: External ear normal.  Left Ear: External ear normal.  Bilat tm's mild erythema.  Sinus nontender.  Pharynx mild erythema Eyes: Conjunctivae and EOM are normal. Pupils are equal, round, and reactive to light.  Neck: Normal range of motion. Neck  supple.  Cardiovascular: Normal rate and regular rhythm.   Pulmonary/Chest: Effort normal and breath sounds mild decreased, with mild wheezing Neurological: Pt is alert. No cranial nerve deficit.  Skin: Skin is warm. No erythema.  Psychiatric: Pt behavior is normal. Thought content normal. 1+ nervous    Assessment & Plan:

## 2011-01-09 NOTE — Assessment & Plan Note (Signed)
stable overall by hx and exam, most recent data reviewed with pt, and pt to continue medical treatment as before  BP Readings from Last 3 Encounters:  01/09/11 112/68  11/16/10 121/65  10/31/10 100/58

## 2011-02-21 ENCOUNTER — Encounter: Payer: Self-pay | Admitting: Internal Medicine

## 2011-02-21 ENCOUNTER — Ambulatory Visit (INDEPENDENT_AMBULATORY_CARE_PROVIDER_SITE_OTHER)
Admission: RE | Admit: 2011-02-21 | Discharge: 2011-02-21 | Disposition: A | Discharge status: Home / Self Care | Payer: Medicare PPO | Source: Ambulatory Visit | Attending: Internal Medicine | Admitting: Internal Medicine

## 2011-02-21 ENCOUNTER — Ambulatory Visit (INDEPENDENT_AMBULATORY_CARE_PROVIDER_SITE_OTHER): Payer: Medicare PPO | Admitting: Internal Medicine

## 2011-02-21 VITALS — BP 102/60 | HR 83 | Temp 97.8°F | Ht 67.0 in | Wt 176.0 lb

## 2011-02-21 DIAGNOSIS — M25569 Pain in unspecified knee: Secondary | ICD-10-CM

## 2011-02-21 DIAGNOSIS — H9311 Tinnitus, right ear: Secondary | ICD-10-CM

## 2011-02-21 DIAGNOSIS — J309 Allergic rhinitis, unspecified: Secondary | ICD-10-CM

## 2011-02-21 DIAGNOSIS — M25561 Pain in right knee: Secondary | ICD-10-CM | POA: Insufficient documentation

## 2011-02-21 DIAGNOSIS — H9319 Tinnitus, unspecified ear: Secondary | ICD-10-CM

## 2011-02-21 MED ORDER — METHYLPREDNISOLONE ACETATE PF 80 MG/ML IJ SUSP
120.0000 mg | Freq: Once | INTRAMUSCULAR | Status: AC
  Administered 2011-02-21: 120 mg via INTRAMUSCULAR

## 2011-02-21 NOTE — Patient Instructions (Addendum)
You had the steroid shot today Continue all other medications as before Please go to XRAY in the Basement for the x-ray test

## 2011-02-22 ENCOUNTER — Other Ambulatory Visit: Payer: Self-pay | Admitting: Internal Medicine

## 2011-02-22 DIAGNOSIS — M25561 Pain in right knee: Secondary | ICD-10-CM

## 2011-02-24 ENCOUNTER — Encounter: Payer: Self-pay | Admitting: Internal Medicine

## 2011-02-24 NOTE — Assessment & Plan Note (Signed)
I suspect related to allergies, eustachain valve symptoms and for tx as per allergies, consider ENT if not improved in 1-2 wks

## 2011-02-24 NOTE — Assessment & Plan Note (Signed)
Rather marked pain and swelling, for plain films today, doubt fx but ? DJD vs other etiology, pt declines ortho as he leaves office today, but might re-consider

## 2011-02-24 NOTE — Progress Notes (Signed)
Subjective:    Patient ID: [name], male    DOB: 24-Apr-1955, 55 y.o.   MRN: [id number]  HPI  Does have several wks ongoing nasal allergy symptoms with clear congestion, itch and sneeze, without fever, pain, ST, cough or wheezing, with right ear tinnnits as well.  Pt denies chest pain, increased sob or doe, wheezing, orthopnea, PND, increased LE swelling, palpitations, dizziness or syncope.  Pt denies new neurological symptoms such as new headache, or facial or extremity weakness or numbness   Pt denies polydipsia, polyuria. Does also have right knee pain/swelling without injury or fall sudden onset it seems in the past wk as well.   Pt denies fever, wt loss, night sweats, loss of appetite, or other constitutional symptoms Past Medical History  Diagnosis Date  . Hypertension   . Seizures   . Personal history of venous thrombosis and embolism 02/01/2007  . RASH-NONVESICULAR 05/12/2007  . INSOMNIA-SLEEP DISORDER-UNSPEC 03/07/2009  . SEIZURE DISORDER 09/29/2006  . FIBROMYALGIA 02/01/2007  . ROTATOR CUFF SYNDROME, RIGHT 02/01/2007  . BACK PAIN 05/12/2007  . NECK PAIN, LEFT 03/07/2009  . DISC DISEASE, CERVICAL 02/01/2007  . SPONDYLOSIS, LUMBAR 02/01/2007  . PEPTIC ULCER DISEASE 02/01/2007  . GERD 09/29/2006  . BRONCHITIS, ACUTE 02/27/2009  . HYPERTENSION 09/29/2006  . PAIN, CHRONIC NEC 09/29/2006  . SLEEP APNEA, OBSTRUCTIVE, MODERATE 09/29/2006  . DEPRESSION 09/29/2006  . ANXIETY 09/29/2006  . PROTEIN C DEFICIENCY 09/29/2006  . ANEMIA, PROTEIN-DEFICIENCY 09/29/2006   Past Surgical History  Procedure Date  . Appendectomy   . Electrical stimulator in back     reports that he has never smoked. He has never used smokeless tobacco. He reports that he does not drink alcohol or use illicit drugs. family history includes Hypertension in his other. Allergies  Allergen Reactions  . Topiramate    Current Outpatient Prescriptions on File Prior to Visit  Medication Sig Dispense Refill  . amLODipine (NORVASC)  5 MG tablet TAKE 1 TABLET ONE TIME DAILY  90 tablet  1  . HYDROmorphone (DILAUDID) 2 MG tablet Take 2 mg by mouth every 6 (six) hours as needed.        . lamoTRIgine (LAMICTAL) 200 MG tablet Take 200 mg by mouth daily.        [name] omeprazole (PRILOSEC) 40 MG capsule Take 1 capsule (40 mg total) by mouth daily.  30 capsule  11  . traZODone (DESYREL) 50 MG tablet Take 50 mg by mouth at bedtime.        [name] zolpidem (AMBIEN) 10 MG tablet Take 10 mg by mouth at bedtime as needed.        . predniSONE (DELTASONE) 10 MG tablet 3 tabs by mouth per day for 3 days, then 2 tabs per day for 3 days, then 1 tab per day for 3 days, then stop   18 tablet  0   Review of Systems Review of Systems  Constitutional: Negative for diaphoresis and unexpected weight change.  HENT: Negative for drooling and tinnitus.   Eyes: Negative for photophobia and visual disturbance.  Respiratory: Negative for choking and stridor.   Gastrointestinal: Negative for vomiting and blood in stool.  Genitourinary: Negative for hematuria and decreased urine volume.     Objective:   Physical Exam BP 102/60  Pulse 83  Temp(Src) 97.8 F (36.6 C) (Oral)  Ht 5\' 7"  (1.702 m)  Wt 176 lb (79.833 kg)  BMI 27.57 kg/m2  SpO2 97% Physical Exam  VS noted Constitutional: Pt appears well-developed  and well-nourished.  HENT: Head: Normocephalic.  Right Ear: External ear normal.  Left Ear: External ear normal.  Bilat tm's mild erythema.  Sinus nontender.  Pharynx mild erythema Eyes: Conjunctivae and EOM are normal. Pupils are equal, round, and reactive to light.  Neck: Normal range of motion. Neck supple.  Cardiovascular: Normal rate and regular rhythm.   Pulmonary/Chest: Effort normal and breath sounds normal.  Neurological: Pt is alert. No cranial nerve deficit.  Skin: Skin is warm. No erythema.  Psychiatric: Pt behavior is normal. Thought content normal.  Right knee with 2+ effusion, decreased ROM by 50%, limps to walk with mild crepitus     Assessment & Plan:

## 2011-02-24 NOTE — Assessment & Plan Note (Signed)
Mild to mod, for depomedrol IM and re-start allegra otc prn,  to f/u any worsening symptoms or concerns

## 2011-03-11 ENCOUNTER — Encounter: Payer: Self-pay | Admitting: Internal Medicine

## 2011-03-11 ENCOUNTER — Ambulatory Visit (INDEPENDENT_AMBULATORY_CARE_PROVIDER_SITE_OTHER): Payer: Medicare PPO | Admitting: Internal Medicine

## 2011-03-11 VITALS — BP 110/62 | HR 79 | Temp 98.6°F | Ht 67.0 in | Wt 173.0 lb

## 2011-03-11 DIAGNOSIS — F3289 Other specified depressive episodes: Secondary | ICD-10-CM

## 2011-03-11 DIAGNOSIS — G4700 Insomnia, unspecified: Secondary | ICD-10-CM

## 2011-03-11 DIAGNOSIS — J309 Allergic rhinitis, unspecified: Secondary | ICD-10-CM

## 2011-03-11 DIAGNOSIS — I10 Essential (primary) hypertension: Secondary | ICD-10-CM

## 2011-03-11 DIAGNOSIS — F329 Major depressive disorder, single episode, unspecified: Secondary | ICD-10-CM

## 2011-03-11 MED ORDER — LAMOTRIGINE 200 MG PO TABS: 200.0000 mg | ORAL_TABLET | Freq: Every day | ORAL | Status: DC

## 2011-03-11 MED ORDER — CLONAZEPAM 1 MG PO TABS: ORAL_TABLET | ORAL | Status: DC

## 2011-03-11 MED ORDER — AMITRIPTYLINE HCL 100 MG PO TABS: 100.0000 mg | ORAL_TABLET | Freq: Every day | ORAL | Status: DC

## 2011-03-11 NOTE — Patient Instructions (Addendum)
Ok to stop the [place], and trazodone Ok to start the klonopin and elavil at night instead Continue all other medications as before The prescriptions were sent to [facility] as you requested OK to do the blood work on Jan 5 or similar date

## 2011-03-17 ENCOUNTER — Encounter: Payer: Self-pay | Admitting: Internal Medicine

## 2011-03-17 NOTE — Progress Notes (Signed)
Subjective:    Patient ID: [name], male    DOB: 08-10-1955, 55 y.o.   MRN: [id number]  HPI  Here to f/u; overall doing ok,  Pt denies chest pain, increased sob or doe, wheezing, orthopnea, PND, increased LE swelling, palpitations, dizziness or syncope.  Pt denies new neurological symptoms such as new headache, or facial or extremity weakness or numbness   Pt denies polydipsia, polyuria.  Med change last visit did not help insomnia and requuests change back to klonopin and elavil.  Denies worsening depressive symptoms, suicidal ideation, or panic, though has ongoing anxiety, not increased recently.  Does have several wks ongoing nasal allergy symptoms with clear congestion, itch and sneeze, without fever, pain, ST, cough or wheezing. Past Medical History  Diagnosis Date  . Hypertension   . Seizures   . Personal history of venous thrombosis and embolism 02/01/2007  . RASH-NONVESICULAR 05/12/2007  . INSOMNIA-SLEEP DISORDER-UNSPEC 03/07/2009  . SEIZURE DISORDER 09/29/2006  . FIBROMYALGIA 02/01/2007  . ROTATOR CUFF SYNDROME, RIGHT 02/01/2007  . BACK PAIN 05/12/2007  . NECK PAIN, LEFT 03/07/2009  . DISC DISEASE, CERVICAL 02/01/2007  . SPONDYLOSIS, LUMBAR 02/01/2007  . PEPTIC ULCER DISEASE 02/01/2007  . GERD 09/29/2006  . BRONCHITIS, ACUTE 02/27/2009  . HYPERTENSION 09/29/2006  . PAIN, CHRONIC NEC 09/29/2006  . SLEEP APNEA, OBSTRUCTIVE, MODERATE 09/29/2006  . DEPRESSION 09/29/2006  . ANXIETY 09/29/2006  . PROTEIN C DEFICIENCY 09/29/2006  . ANEMIA, PROTEIN-DEFICIENCY 09/29/2006   Past Surgical History  Procedure Date  . Appendectomy   . Electrical stimulator in back     reports that he has never smoked. He has never used smokeless tobacco. He reports that he does not drink alcohol or use illicit drugs. family history includes Hypertension in his other. Allergies  Allergen Reactions  . Topiramate    Current Outpatient Prescriptions on File Prior to Visit  Medication Sig Dispense Refill  .  amLODipine (NORVASC) 5 MG tablet TAKE 1 TABLET ONE TIME DAILY  90 tablet  1  . HYDROmorphone (DILAUDID) 2 MG tablet Take 2 mg by mouth every 6 (six) hours as needed.        [name] omeprazole (PRILOSEC) 40 MG capsule Take 1 capsule (40 mg total) by mouth daily.  30 capsule  11   Review of Systems Review of Systems  Constitutional: Negative for diaphoresis and unexpected weight change.  HENT: Negative for drooling and tinnitus.   Eyes: Negative for photophobia and visual disturbance.  Respiratory: Negative for choking and stridor.   Gastrointestinal: Negative for vomiting and blood in stool.  Genitourinary: Negative for hematuria and decreased urine volume.    Objective:   Physical Exam BP 110/62  Pulse 79  Temp(Src) 98.6 F (37 C) (Oral)  Ht 5\' 7"  (1.702 m)  Wt 173 lb (78.472 kg)  BMI 27.10 kg/m2  SpO2 97% Physical Exam  VS noted Constitutional: Pt appears well-developed and well-nourished.  HENT: Head: Normocephalic.  Right Ear: External ear normal.  Left Ear: External ear normal.  Bilat tm's mild erythema.  Sinus nontender.  Pharynx mild erythema Eyes: Conjunctivae and EOM are normal. Pupils are equal, round, and reactive to light.  Neck: Normal range of motion. Neck supple.  Cardiovascular: Normal rate and regular rhythm.   Pulmonary/Chest: Effort normal and breath sounds normal.  Abd:  Soft, NT, non-distended, + BS Neurological: Pt is alert. No cranial nerve deficit.  Skin: Skin is warm. No erythema.  Psychiatric: Pt behavior is normal. Thought content normal.  Assessment & Plan:

## 2011-03-17 NOTE — Assessment & Plan Note (Signed)
Mild to mod, for alegra asd otc,  to f/u any worsening symptoms or concerns

## 2011-03-17 NOTE — Assessment & Plan Note (Signed)
Ok to change back qhs meds to klonopin/elavil as this seemed to work better,  to f/u any worsening symptoms or concerns

## 2011-03-17 NOTE — Assessment & Plan Note (Signed)
stable overall by hx and exam, most recent data reviewed with pt, and pt to continue medical treatment as before  Lab Results  Component Value Date   WBC 5.4 10/29/2010   HGB 15.6 10/29/2010   HCT 45.8 10/29/2010   PLT 178.0 10/29/2010   GLUCOSE 116* 10/29/2010   CHOL 127 05/22/2010   TRIG 161.0* 05/22/2010   HDL 33.90* 05/22/2010   LDLCALC 61 05/22/2010   ALT 36 10/29/2010   AST 24 10/29/2010   NA 136 10/29/2010   K 3.9 10/29/2010   CL 100 10/29/2010   CREATININE 1.2 10/29/2010   BUN 18 10/29/2010   CO2 26 10/29/2010   TSH 1.54 03/27/2010   PSA 0.96 03/27/2010   INR 1.0 02/03/2008

## 2011-03-17 NOTE — Assessment & Plan Note (Signed)
stable overall by hx and exam, most recent data reviewed with pt, and pt to continue medical treatment as before  BP Readings from Last 3 Encounters:  03/11/11 110/62  02/21/11 102/60  01/09/11 112/68

## 2011-03-29 ENCOUNTER — Ambulatory Visit: Payer: Medicare PPO | Admitting: Internal Medicine

## 2011-04-01 ENCOUNTER — Other Ambulatory Visit (INDEPENDENT_AMBULATORY_CARE_PROVIDER_SITE_OTHER): Payer: Medicare PPO

## 2011-04-01 ENCOUNTER — Other Ambulatory Visit: Payer: Self-pay | Admitting: Internal Medicine

## 2011-04-01 DIAGNOSIS — Z125 Encounter for screening for malignant neoplasm of prostate: Secondary | ICD-10-CM

## 2011-04-01 DIAGNOSIS — I10 Essential (primary) hypertension: Secondary | ICD-10-CM

## 2011-04-01 DIAGNOSIS — Z0000 Encounter for general adult medical examination without abnormal findings: Secondary | ICD-10-CM

## 2011-04-01 DIAGNOSIS — F329 Major depressive disorder, single episode, unspecified: Secondary | ICD-10-CM

## 2011-04-01 LAB — PSA: PSA: 0.94 ng/mL (ref 0.10–4.00)

## 2011-04-01 LAB — BASIC METABOLIC PANEL
Calcium: 9.5 mg/dL (ref 8.4–10.5)
Creatinine, Ser: 1.3 mg/dL (ref 0.4–1.5)
GFR: 61.12 mL/min (ref >60.00)
Sodium: 142 mEq/L (ref 135–145)

## 2011-04-01 LAB — URINALYSIS, ROUTINE W REFLEX MICROSCOPIC
Hgb urine dipstick: NEGATIVE
Leukocytes, UA: NEGATIVE
Specific Gravity, Urine: 1.025 (ref 1.000–1.030)
Urine Glucose: NEGATIVE
Urobilinogen, UA: 0.2 (ref 0.0–1.0)

## 2011-04-01 LAB — LDL CHOLESTEROL, DIRECT: Direct LDL: 129.3 mg/dL

## 2011-04-01 LAB — LIPID PANEL
Cholesterol: 208 mg/dL — ABNORMAL HIGH (ref 0–200)
HDL: 45.2 mg/dL (ref >39.00)
Triglycerides: 173 mg/dL — ABNORMAL HIGH (ref 0.0–149.0)

## 2011-04-01 LAB — CBC WITH DIFFERENTIAL/PLATELET
Basophils Relative: 0.8 % (ref 0.0–3.0)
HCT: 45.7 % (ref 39.0–52.0)
Hemoglobin: 15.9 g/dL (ref 13.0–17.0)
Lymphocytes Relative: 28.4 % (ref 12.0–46.0)
Lymphs Abs: 1.4 10*3/uL (ref 0.7–4.0)
MCHC: 34.9 g/dL (ref 30.0–36.0)
Monocytes Relative: 5.1 % (ref 3.0–12.0)
Neutro Abs: 3 10*3/uL (ref 1.4–7.7)
RBC: 5.09 Mil/uL (ref 4.22–5.81)
RDW: 13.3 % (ref 11.5–14.6)

## 2011-04-01 LAB — TSH: TSH: 0.92 u[IU]/mL (ref 0.35–5.50)

## 2011-04-02 ENCOUNTER — Encounter: Payer: Self-pay | Admitting: Internal Medicine

## 2011-04-02 DIAGNOSIS — R7302 Impaired glucose tolerance (oral): Secondary | ICD-10-CM

## 2011-04-02 HISTORY — DX: Impaired glucose tolerance (oral): R73.02

## 2011-04-02 LAB — HEPATIC FUNCTION PANEL
AST: 22 U/L (ref 0–37)
Albumin: 4.2 g/dL (ref 3.5–5.2)
Alkaline Phosphatase: 90 U/L (ref 39–117)
Total Protein: 7.4 g/dL (ref 6.0–8.3)

## 2011-04-03 ENCOUNTER — Telehealth: Payer: Self-pay | Admitting: Internal Medicine

## 2011-04-03 MED ORDER — OMEPRAZOLE 40 MG PO CPDR: 40.0000 mg | DELAYED_RELEASE_CAPSULE | Freq: Every day | ORAL | Status: DC

## 2011-04-03 NOTE — Telephone Encounter (Signed)
Per pt desire omeprazole 40 mg  sent to [facility]---90 day supply--Per pt this wii not cost him anything.  Pt got prescription filled today for $12.00 at [facility].  Per pt if you have any questions please call him.

## 2011-04-09 ENCOUNTER — Other Ambulatory Visit: Payer: Self-pay

## 2011-04-09 MED ORDER — OMEPRAZOLE 40 MG PO CPDR: 40.0000 mg | DELAYED_RELEASE_CAPSULE | Freq: Every day | ORAL | Status: DC

## 2011-09-12 ENCOUNTER — Other Ambulatory Visit: Payer: Self-pay | Admitting: Internal Medicine

## 2011-09-12 NOTE — Telephone Encounter (Signed)
Done hardcopy to robin  

## 2011-09-13 NOTE — Telephone Encounter (Signed)
Faxed hardcopy to pharmacy. 

## 2011-09-23 ENCOUNTER — Telehealth: Payer: Self-pay | Admitting: Internal Medicine

## 2011-09-23 NOTE — Telephone Encounter (Signed)
Caller: [name]/Patient; PCP: Oliver Barre; CB#: [phone]; ; ; Call regarding RX Needed for CPAP Sent To [facility] for new equipment.  Pt needs new supply, mask, hoses and machine, due new updates.  Please fax order to [phone].

## 2011-09-24 NOTE — Telephone Encounter (Signed)
I do not normally tx or do supplies for osa;  This should go to the treating MD;  If pt does not have one, I can refer to pulm at [facility] for f/u tx and supplies

## 2011-09-24 NOTE — Telephone Encounter (Signed)
Patient informed. He stated he was unsure of MD and would call back if need a referral to pulmonary

## 2011-12-10 ENCOUNTER — Other Ambulatory Visit: Payer: Self-pay

## 2011-12-10 MED ORDER — AMITRIPTYLINE HCL 100 MG PO TABS: 100.0000 mg | ORAL_TABLET | Freq: Every day | ORAL | Status: DC

## 2011-12-10 MED ORDER — AMLODIPINE BESYLATE 5 MG PO TABS: 5.0000 mg | ORAL_TABLET | Freq: Every day | ORAL | Status: DC

## 2011-12-10 NOTE — Telephone Encounter (Signed)
Done per emr 

## 2011-12-11 ENCOUNTER — Other Ambulatory Visit: Payer: Self-pay | Admitting: Internal Medicine

## 2011-12-11 MED ORDER — HYOSCYAMINE SULFATE 0.125 MG SL SUBL: 0.1250 mg | SUBLINGUAL_TABLET | SUBLINGUAL | Status: DC | PRN

## 2011-12-11 MED ORDER — TRAZODONE HCL 50 MG PO TABS: 50.0000 mg | ORAL_TABLET | Freq: Every evening | ORAL | Status: DC | PRN

## 2011-12-11 NOTE — Telephone Encounter (Signed)
Faxed hardcopy to pharmacy. 

## 2011-12-13 ENCOUNTER — Telehealth: Payer: Self-pay

## 2011-12-13 NOTE — Telephone Encounter (Signed)
Called the patient informed to call his pharmacy for refill as should have 1 left.  He stated he has only 4 left and will be out before can get his mailorder to deliver.  Please advise if can send to [facility] to cover until mail order comes in.

## 2011-12-13 NOTE — Telephone Encounter (Signed)
Pt called requesting refill of Klonopin to mail order pharmacy

## 2011-12-13 NOTE — Telephone Encounter (Signed)
Patient informed. 

## 2011-12-13 NOTE — Telephone Encounter (Signed)
Sorry, I cannot accomodate this due to nature of controlled substance, no early or 'extra" refills, he may wish to have this filled at local pharmacy in the future

## 2011-12-13 NOTE — Telephone Encounter (Signed)
Too soon, was last done June 20 for #90 with 1 refill (at 1 tab per day should last 66mo)

## 2012-03-16 ENCOUNTER — Other Ambulatory Visit: Payer: Self-pay | Admitting: Internal Medicine

## 2012-03-16 NOTE — Telephone Encounter (Signed)
Faxed hardcopy to pharmacy. 

## 2012-03-16 NOTE — Telephone Encounter (Signed)
Patient informed and will call back to schedule appt. As advised

## 2012-03-16 NOTE — Telephone Encounter (Signed)
Done hardcopy to [name]  [name] to let pt know - we can only do thirty now, as he is due for ROV

## 2012-03-27 ENCOUNTER — Other Ambulatory Visit: Payer: Self-pay

## 2012-03-27 MED ORDER — AMLODIPINE BESYLATE 5 MG PO TABS: 5.0000 mg | ORAL_TABLET | Freq: Every day | ORAL | Status: DC

## 2012-03-30 ENCOUNTER — Other Ambulatory Visit (INDEPENDENT_AMBULATORY_CARE_PROVIDER_SITE_OTHER): Payer: Medicare PPO

## 2012-03-30 ENCOUNTER — Ambulatory Visit (INDEPENDENT_AMBULATORY_CARE_PROVIDER_SITE_OTHER): Payer: Medicare PPO | Admitting: Internal Medicine

## 2012-03-30 ENCOUNTER — Encounter: Payer: Self-pay | Admitting: Internal Medicine

## 2012-03-30 VITALS — BP 110/70 | HR 83 | Temp 98.0°F | Ht 67.0 in | Wt 189.5 lb

## 2012-03-30 DIAGNOSIS — R7309 Other abnormal glucose: Secondary | ICD-10-CM

## 2012-03-30 DIAGNOSIS — F329 Major depressive disorder, single episode, unspecified: Secondary | ICD-10-CM

## 2012-03-30 DIAGNOSIS — R7302 Impaired glucose tolerance (oral): Secondary | ICD-10-CM

## 2012-03-30 DIAGNOSIS — R220 Localized swelling, mass and lump, head: Secondary | ICD-10-CM

## 2012-03-30 DIAGNOSIS — I10 Essential (primary) hypertension: Secondary | ICD-10-CM

## 2012-03-30 DIAGNOSIS — Z0000 Encounter for general adult medical examination without abnormal findings: Secondary | ICD-10-CM

## 2012-03-30 DIAGNOSIS — R221 Localized swelling, mass and lump, neck: Secondary | ICD-10-CM

## 2012-03-30 LAB — CBC WITH DIFFERENTIAL/PLATELET
Basophils Absolute: 0 10*3/uL (ref 0.0–0.1)
Basophils Relative: 0.7 % (ref 0.0–3.0)
Eosinophils Absolute: 0.3 10*3/uL (ref 0.0–0.7)
Lymphocytes Relative: 37.4 % (ref 12.0–46.0)
MCHC: 33.9 g/dL (ref 30.0–36.0)
Monocytes Absolute: 0.4 10*3/uL (ref 0.1–1.0)
Neutrophils Relative %: 49.7 % (ref 43.0–77.0)
Platelets: 204 10*3/uL (ref 150.0–400.0)
RBC: 5.16 Mil/uL (ref 4.22–5.81)
RDW: 13.1 % (ref 11.5–14.6)

## 2012-03-30 LAB — URINALYSIS, ROUTINE W REFLEX MICROSCOPIC
Bilirubin Urine: NEGATIVE
Hgb urine dipstick: NEGATIVE
Leukocytes, UA: NEGATIVE
Nitrite: NEGATIVE
Urobilinogen, UA: 0.2 (ref 0.0–1.0)

## 2012-03-30 LAB — HEPATIC FUNCTION PANEL
ALT: 27 U/L (ref 0–53)
AST: 24 U/L (ref 0–37)
Bilirubin, Direct: 0.1 mg/dL (ref 0.0–0.3)
Total Protein: 7.6 g/dL (ref 6.0–8.3)

## 2012-03-30 LAB — TSH: TSH: 1.59 u[IU]/mL (ref 0.35–5.50)

## 2012-03-30 LAB — BASIC METABOLIC PANEL
CO2: 31 mEq/L (ref 19–32)
Chloride: 102 mEq/L (ref 96–112)
Creatinine, Ser: 1.2 mg/dL (ref 0.4–1.5)
Potassium: 4.1 mEq/L (ref 3.5–5.1)

## 2012-03-30 LAB — LIPID PANEL
Cholesterol: 170 mg/dL (ref 0–200)
Total CHOL/HDL Ratio: 5
Triglycerides: 195 mg/dL — ABNORMAL HIGH (ref 0.0–149.0)

## 2012-03-30 MED ORDER — AMLODIPINE BESYLATE 5 MG PO TABS: 5.0000 mg | ORAL_TABLET | Freq: Every day | ORAL | Status: DC

## 2012-03-30 MED ORDER — CLONAZEPAM 1 MG PO TABS: ORAL_TABLET | ORAL | Status: DC

## 2012-03-30 MED ORDER — OMEPRAZOLE 40 MG PO CPDR: 40.0000 mg | DELAYED_RELEASE_CAPSULE | Freq: Every day | ORAL | Status: DC

## 2012-03-30 MED ORDER — AMITRIPTYLINE HCL 100 MG PO TABS: 100.0000 mg | ORAL_TABLET | Freq: Every day | ORAL | Status: DC

## 2012-03-30 MED ORDER — LAMOTRIGINE 200 MG PO TABS: 200.0000 mg | ORAL_TABLET | Freq: Every day | ORAL | Status: DC

## 2012-03-30 NOTE — Assessment & Plan Note (Signed)
stable overall by hx and exam, most recent data reviewed with pt, and pt to continue medical treatment as before BP Readings from Last 3 Encounters:  03/30/12 110/70  03/11/11 110/62  02/21/11 102/60

## 2012-03-30 NOTE — Progress Notes (Signed)
Subjective:    Patient ID: [name], male    DOB: 1955/12/31, 57 y.o.   MRN: [id number]  HPI  Here for wellness and f/u;  Overall doing ok;  Pt denies CP, worsening SOB, DOE, wheezing, orthopnea, PND, worsening LE edema, palpitations, dizziness or syncope.  Pt denies neurological change such as new Headache, facial or extremity weakness.  Pt denies polydipsia, polyuria, or low sugar symptoms. Pt states overall good compliance with treatment and medications, good tolerability, and trying to follow lower cholesterol diet.  Pt denies worsening depressive symptoms, suicidal ideation or panic. No fever, wt loss, night sweats, loss of appetite, or other constitutional symptoms.  Pt states good ability with ADL's, low fall risk, home safety reviewed and adequate, no significant changes in hearing or vision, and occasionally active with exercise.  S/p right knee - improved, seeing Dr Ethelene Hal. Pt continues to have recurring LBP without change in severity, bowel or bladder change, fever, wt loss,  worsening LE pain/numbness/weakness, gait change or falls, now s/p stimulator Past Medical History  Diagnosis Date  . Hypertension   . Seizures   . Personal history of venous thrombosis and embolism 02/01/2007  . RASH-NONVESICULAR 05/12/2007  . INSOMNIA-SLEEP DISORDER-UNSPEC 03/07/2009  . SEIZURE DISORDER 09/29/2006  . FIBROMYALGIA 02/01/2007  . ROTATOR CUFF SYNDROME, RIGHT 02/01/2007  . BACK PAIN 05/12/2007  . NECK PAIN, LEFT 03/07/2009  . DISC DISEASE, CERVICAL 02/01/2007  . SPONDYLOSIS, LUMBAR 02/01/2007  . PEPTIC ULCER DISEASE 02/01/2007  . GERD 09/29/2006  . BRONCHITIS, ACUTE 02/27/2009  . HYPERTENSION 09/29/2006  . PAIN, CHRONIC NEC 09/29/2006  . SLEEP APNEA, OBSTRUCTIVE, MODERATE 09/29/2006  . DEPRESSION 09/29/2006  . ANXIETY 09/29/2006  . PROTEIN C DEFICIENCY 09/29/2006  . ANEMIA, PROTEIN-DEFICIENCY 09/29/2006  . Impaired glucose tolerance 04/02/2011   Past Surgical History  Procedure Date  . Appendectomy   .  Electrical stimulator in back     reports that he has never smoked. He has never used smokeless tobacco. He reports that he does not drink alcohol or use illicit drugs. family history includes Hypertension in his other. Allergies  Allergen Reactions  . Topiramate    Current Outpatient Prescriptions on File Prior to Visit  Medication Sig Dispense Refill  . amitriptyline (ELAVIL) 100 MG tablet Take 1 tablet (100 mg total) by mouth at bedtime.  90 tablet  1  . amLODipine (NORVASC) 5 MG tablet Take 1 tablet (5 mg total) by mouth daily.  90 tablet  3  . HYDROmorphone (DILAUDID) 2 MG tablet Take 2 mg by mouth every 6 (six) hours as needed.        . lamoTRIgine (LAMICTAL) 200 MG tablet Take 1 tablet (200 mg total) by mouth daily.  90 tablet  3  . omeprazole (PRILOSEC) 40 MG capsule Take 1 capsule (40 mg total) by mouth daily.  90 capsule  3  . clonazepam (KLONOPIN) 2 MG disintegrating tablet Take 1 tablet (2 mg total) by mouth at bedtime as needed.  90 tablet  1  . hyoscyamine (LEVSIN SL) 0.125 MG SL tablet Place 1 tablet (0.125 mg total) under the tongue every 4 (four) hours as needed for cramping.  30 tablet  1  . traZODone (DESYREL) 50 MG tablet Take 1 tablet (50 mg total) by mouth at bedtime.  90 tablet  1   Review of Systems Review of Systems  Constitutional: Negative for diaphoresis, activity change, appetite change and unexpected weight change.  HENT: Negative for hearing loss, ear pain, facial swelling,  mouth sores and neck stiffness.   Eyes: Negative for pain, redness and visual disturbance.  Respiratory: Negative for shortness of breath and wheezing.   Cardiovascular: Negative for chest pain and palpitations.  Gastrointestinal: Negative for diarrhea, blood in stool, abdominal distention and rectal pain.  Genitourinary: Negative for hematuria, flank pain and decreased urine volume.  Musculoskeletal: Negative for myalgias and joint swelling.  Skin: Negative for color change and wound.    Neurological: Negative for syncope and numbness.  Hematological: Negative for adenopathy.  Psychiatric/Behavioral: Negative for hallucinations, self-injury, decreased concentration and agitation.      Objective:   Physical Exam BP 110/70  Pulse 83  Temp 98 F (36.7 C) (Oral)  Ht 5\' 7"  (1.702 m)  Wt 189 lb 8 oz (85.957 kg)  BMI 29.68 kg/m2  SpO2 97% Physical Exam  VS noted Constitutional: Pt is oriented to person, place, and time. Appears well-developed and well-nourished.  HENT:  Head: Normocephalic and atraumatic.  Right Ear: External ear normal.  Left Ear: External ear normal.  Nose: Nose normal.  Mouth/Throat: Oropharynx is clear and moist.  Eyes: Conjunctivae and EOM are normal. Pupils are equal, round, and reactive to light.  Neck: Normal range of motion. Neck supple. No JVD present. No tracheal deviation present. left neck with nondiscrete firm but not hard mass at angle left jaw, approx 1.5 cm, not mobile Cardiovascular: Normal rate, regular rhythm, normal heart sounds and intact distal pulses.   Pulmonary/Chest: Effort normal and breath sounds normal.  Abdominal: Soft. Bowel sounds are normal. There is no tenderness.  Musculoskeletal: Normal range of motion. Exhibits no edema.  Lymphadenopathy:  Has no cervical adenopathy.  Neurological: Pt is alert and oriented to person, place, and time. Pt has normal reflexes. No cranial nerve deficit.  Skin: Skin is warm and dry. No rash noted.  Psychiatrict. Behavior is normal.  Mild nervous     Assessment & Plan:

## 2012-03-30 NOTE — Assessment & Plan Note (Signed)
Etiology unclear, ongoing for > 1 mo per pt, to refer ENT

## 2012-03-30 NOTE — Addendum Note (Signed)
Addended by: Corwin Levins on: 03/30/2012 09:31 PM   Modules accepted: Level of Service

## 2012-03-30 NOTE — Assessment & Plan Note (Signed)
stable overall by hx and exam, most recent data reviewed with pt, and pt to continue medical treatment as before Lab Results  Component Value Date   HGBA1C 5.3 03/30/2012

## 2012-03-30 NOTE — Assessment & Plan Note (Signed)

## 2012-03-30 NOTE — Patient Instructions (Addendum)
Continue all other medications as before Please go to LAB in the Basement for the blood and/or urine tests to be done today You will be contacted by phone if any changes need to be made immediately.  Otherwise, you will receive a letter about your results with an explanation, but please check with MyChart first. Thank you for enrolling in MyChart. Please follow the instructions below to securely access your online medical record. MyChart allows you to send messages to your doctor, view your test results, renew your prescriptions, schedule appointments, and more. To Log into MyChart, please go to [email or web address], and your Username is: [email or web address] Please return in 6 mo, or sooner if needed

## 2012-04-14 ENCOUNTER — Other Ambulatory Visit: Payer: Self-pay | Admitting: Internal Medicine

## 2012-04-14 NOTE — Telephone Encounter (Signed)
Faxed hardcopy to pharmacy. 

## 2012-04-14 NOTE — Telephone Encounter (Signed)
Done hardcopy to robin  

## 2012-07-15 ENCOUNTER — Other Ambulatory Visit: Payer: Self-pay | Admitting: Internal Medicine

## 2012-07-15 NOTE — Telephone Encounter (Signed)
Done hardcopy to robin  

## 2012-07-16 NOTE — Telephone Encounter (Signed)
Faxed hardcopy to pharmacy. 

## 2012-09-28 ENCOUNTER — Ambulatory Visit (INDEPENDENT_AMBULATORY_CARE_PROVIDER_SITE_OTHER): Payer: Medicare PPO | Admitting: Internal Medicine

## 2012-09-28 ENCOUNTER — Encounter: Payer: Self-pay | Admitting: Internal Medicine

## 2012-09-28 VITALS — BP 114/78 | HR 93 | Temp 97.8°F | Ht 67.0 in | Wt 194.5 lb

## 2012-09-28 DIAGNOSIS — F3289 Other specified depressive episodes: Secondary | ICD-10-CM

## 2012-09-28 DIAGNOSIS — G4700 Insomnia, unspecified: Secondary | ICD-10-CM

## 2012-09-28 DIAGNOSIS — R7309 Other abnormal glucose: Secondary | ICD-10-CM

## 2012-09-28 DIAGNOSIS — F329 Major depressive disorder, single episode, unspecified: Secondary | ICD-10-CM

## 2012-09-28 DIAGNOSIS — R7302 Impaired glucose tolerance (oral): Secondary | ICD-10-CM

## 2012-09-28 DIAGNOSIS — I10 Essential (primary) hypertension: Secondary | ICD-10-CM

## 2012-09-28 MED ORDER — CLONAZEPAM 2 MG PO TABS: 2.0000 mg | ORAL_TABLET | Freq: Every evening | ORAL | Status: DC | PRN

## 2012-09-28 MED ORDER — LAMOTRIGINE 200 MG PO TABS: 200.0000 mg | ORAL_TABLET | Freq: Every day | ORAL | Status: DC

## 2012-09-28 NOTE — Assessment & Plan Note (Signed)
stable overall by history and exam, recent data reviewed with pt, and pt to continue medical treatment as before,  to f/u any worsening symptoms or concerns Lab Results  Component Value Date   WBC 5.3 03/30/2012   HGB 15.3 03/30/2012   HCT 45.2 03/30/2012   PLT 204.0 03/30/2012   GLUCOSE 110* 03/30/2012   CHOL 170 03/30/2012   TRIG 195.0* 03/30/2012   HDL 31.80* 03/30/2012   LDLDIRECT 129.3 04/01/2011   LDLCALC 99 03/30/2012   ALT 27 03/30/2012   AST 24 03/30/2012   NA 139 03/30/2012   K 4.1 03/30/2012   CL 102 03/30/2012   CREATININE 1.2 03/30/2012   BUN 13 03/30/2012   CO2 31 03/30/2012   TSH 1.59 03/30/2012   PSA 1.20 03/30/2012   INR 1.0 02/03/2008   HGBA1C 5.3 03/30/2012

## 2012-09-28 NOTE — Progress Notes (Signed)
Subjective:    Patient ID: [name], male    DOB: 01-22-1956, 57 y.o.   MRN: [id number]  HPI  Here to f/u; overall doing ok,  Pt denies chest pain, increased sob or doe, wheezing, orthopnea, PND, increased LE swelling, palpitations, dizziness or syncope.  Pt denies polydipsia, polyuria, or low sugar symptoms such as weakness or confusion improved with po intake.  Pt denies new neurological symptoms such as new headache, or facial or extremity weakness or numbness.   Pt states overall good compliance with meds, has been trying to follow lower cholesterol diet, with wt overall stable,  but little exercise however.  Had to change pain management MD as Dr Ethelene Hal not able to take [facility]; now to Dr Marca Ancona in [place], has appt July 10, still has 30 day supply pain med for now;  No siezures, needs lamictal refill.  Still with severe insomnia, asks for increase klonopin for bedtime use only. Denies worsening depressive symptoms, suicidal ideation, or panic. No signficant right knee pain after surgury Past Medical History  Diagnosis Date  . Hypertension   . Seizures   . Personal history of venous thrombosis and embolism 02/01/2007  . RASH-NONVESICULAR 05/12/2007  . INSOMNIA-SLEEP DISORDER-UNSPEC 03/07/2009  . SEIZURE DISORDER 09/29/2006  . FIBROMYALGIA 02/01/2007  . ROTATOR CUFF SYNDROME, RIGHT 02/01/2007  . BACK PAIN 05/12/2007  . NECK PAIN, LEFT 03/07/2009  . DISC DISEASE, CERVICAL 02/01/2007  . SPONDYLOSIS, LUMBAR 02/01/2007  . PEPTIC ULCER DISEASE 02/01/2007  . GERD 09/29/2006  . BRONCHITIS, ACUTE 02/27/2009  . HYPERTENSION 09/29/2006  . PAIN, CHRONIC NEC 09/29/2006  . SLEEP APNEA, OBSTRUCTIVE, MODERATE 09/29/2006  . DEPRESSION 09/29/2006  . ANXIETY 09/29/2006  . PROTEIN C DEFICIENCY 09/29/2006  . ANEMIA, PROTEIN-DEFICIENCY 09/29/2006  . Impaired glucose tolerance 04/02/2011   Past Surgical History  Procedure Laterality Date  . Appendectomy    . Electrical stimulator in back      reports that he has never smoked. He has never used smokeless tobacco. He reports that he does not drink alcohol or use illicit drugs. family history includes Hypertension in his other. Allergies  Allergen Reactions  . Topiramate    Current Outpatient Prescriptions on File Prior to Visit  Medication Sig Dispense Refill  . amitriptyline (ELAVIL) 100 MG tablet Take 1 tablet (100 mg total) by mouth at bedtime.  90 tablet  1  . amLODipine (NORVASC) 5 MG tablet Take 1 tablet (5 mg total) by mouth daily.  90 tablet  3  . clonazePAM (KLONOPIN) 1 MG tablet TAKE 1 TABLET BY MOUTH EVERY NIGHT AT BEDTIME FOR SLEEP  30 tablet  2  . HYDROmorphone (DILAUDID) 2 MG tablet Take 2 mg by mouth every 6 (six) hours as needed.        [name] omeprazole (PRILOSEC) 40 MG capsule Take 1 capsule (40 mg total) by mouth daily.  90 capsule  3  . clonazepam (KLONOPIN) 2 MG disintegrating tablet Take 1 tablet (2 mg total) by mouth at bedtime as needed.  90 tablet  1  . hyoscyamine (LEVSIN SL) 0.125 MG SL tablet Place 1 tablet (0.125 mg total) under the tongue every 4 (four) hours as needed for cramping.  30 tablet  1   No current facility-administered medications on file prior to visit.   Review of Systems  Constitutional: Negative for unexpected weight change, or unusual diaphoresis  HENT: Negative for tinnitus.   Eyes: Negative for photophobia and visual disturbance.  Respiratory: Negative for choking and stridor.  Gastrointestinal: Negative for vomiting and blood in stool.  Genitourinary: Negative for hematuria and decreased urine volume.  Musculoskeletal: Negative for acute joint swelling Skin: Negative for color change and wound.  Neurological: Negative for tremors and numbness other than noted  Psychiatric/Behavioral: Negative for decreased concentration or  hyperactivity.       Objective:   Physical Exam BP 114/78  Pulse 93  Temp(Src) 97.8 F (36.6 C) (Oral)  Ht 5\' 7"  (1.702 m)  Wt 194 lb 8 oz (88.225 kg)   BMI 30.46 kg/m2  SpO2 96% VS noted,  Constitutional: Pt appears well-developed and well-nourished.  HENT: Head: NCAT.  Right Ear: External ear normal.  Left Ear: External ear normal.  Eyes: Conjunctivae and EOM are normal. Pupils are equal, round, and reactive to light.  Neck: Normal range of motion. Neck supple.  Cardiovascular: Normal rate and regular rhythm.   Pulmonary/Chest: Effort normal and breath sounds normal.  Abd:  Soft, NT, non-distended, + BS Neurological: Pt is alert. Not confused  Skin: Skin is warm. No erythema.  Psychiatric: Pt behavior is normal. Thought content normal.     Assessment & Plan:

## 2012-09-28 NOTE — Assessment & Plan Note (Signed)
stable overall by history and exam, recent data reviewed with pt, and pt to continue medical treatment as before,  to f/u any worsening symptoms or concerns BP Readings from Last 3 Encounters:  09/28/12 114/78  03/30/12 110/70  03/11/11 110/62

## 2012-09-28 NOTE — Assessment & Plan Note (Signed)
stable overall by history and exam, recent data reviewed with pt, and pt to continue medical treatment as before,  to f/u any worsening symptoms or concerns Lab Results  Component Value Date   HGBA1C 5.3 03/30/2012

## 2012-09-28 NOTE — Patient Instructions (Signed)
OK to increase the klonopin to 2 mg at night Please continue all other medications as before, and refills have been done if requested - the lamictal Please keep your appointments with your specialists as you have planned Please continue your efforts at being more active, low cholesterol diet, and weight control.  Please remember to sign up for My Chart if you have not done so, as this will be important to you in the future with finding out test results, communicating by private email, and scheduling acute appointments online when needed.

## 2012-09-28 NOTE — Assessment & Plan Note (Signed)
Ok for incr klonopin to 2 mg qhs prn

## 2013-02-24 ENCOUNTER — Encounter: Payer: Self-pay | Admitting: Gastroenterology

## 2013-04-01 ENCOUNTER — Ambulatory Visit (INDEPENDENT_AMBULATORY_CARE_PROVIDER_SITE_OTHER): Payer: Medicare PPO | Admitting: Internal Medicine

## 2013-04-01 ENCOUNTER — Encounter: Payer: Self-pay | Admitting: Internal Medicine

## 2013-04-01 ENCOUNTER — Other Ambulatory Visit (INDEPENDENT_AMBULATORY_CARE_PROVIDER_SITE_OTHER): Payer: Medicare PPO

## 2013-04-01 VITALS — BP 128/90 | HR 87 | Temp 97.1°F | Ht 67.0 in | Wt 200.8 lb

## 2013-04-01 DIAGNOSIS — R7302 Impaired glucose tolerance (oral): Secondary | ICD-10-CM

## 2013-04-01 DIAGNOSIS — G8929 Other chronic pain: Secondary | ICD-10-CM

## 2013-04-01 DIAGNOSIS — Z0000 Encounter for general adult medical examination without abnormal findings: Secondary | ICD-10-CM

## 2013-04-01 DIAGNOSIS — F3289 Other specified depressive episodes: Secondary | ICD-10-CM

## 2013-04-01 DIAGNOSIS — R7309 Other abnormal glucose: Secondary | ICD-10-CM

## 2013-04-01 DIAGNOSIS — I10 Essential (primary) hypertension: Secondary | ICD-10-CM

## 2013-04-01 DIAGNOSIS — F329 Major depressive disorder, single episode, unspecified: Secondary | ICD-10-CM

## 2013-04-01 LAB — BASIC METABOLIC PANEL
BUN: 17 mg/dL (ref 6–23)
CALCIUM: 8.8 mg/dL (ref 8.4–10.5)
CO2: 29 mEq/L (ref 19–32)
Chloride: 106 mEq/L (ref 96–112)
Creatinine, Ser: 1.2 mg/dL (ref 0.4–1.5)
GFR: 64.68 mL/min (ref >60.00)
Glucose, Bld: 72 mg/dL (ref 70–99)
POTASSIUM: 4.2 meq/L (ref 3.5–5.1)
SODIUM: 141 meq/L (ref 135–145)

## 2013-04-01 LAB — URINALYSIS, ROUTINE W REFLEX MICROSCOPIC
Bilirubin Urine: NEGATIVE
HGB URINE DIPSTICK: NEGATIVE
KETONES UR: NEGATIVE
LEUKOCYTES UA: NEGATIVE
Nitrite: NEGATIVE
Total Protein, Urine: NEGATIVE
URINE GLUCOSE: NEGATIVE
Urobilinogen, UA: 0.2 (ref 0.0–1.0)
pH: 6 (ref 5.0–8.0)

## 2013-04-01 LAB — HEPATIC FUNCTION PANEL
ALK PHOS: 77 U/L (ref 39–117)
ALT: 35 U/L (ref 0–53)
AST: 30 U/L (ref 0–37)
Albumin: 3.9 g/dL (ref 3.5–5.2)
BILIRUBIN TOTAL: 0.7 mg/dL (ref 0.3–1.2)
Bilirubin, Direct: 0.1 mg/dL (ref 0.0–0.3)
Total Protein: 7.1 g/dL (ref 6.0–8.3)

## 2013-04-01 LAB — LIPID PANEL
Cholesterol: 198 mg/dL (ref 0–200)
HDL: 31.3 mg/dL — AB (ref >39.00)
Total CHOL/HDL Ratio: 6
Triglycerides: 261 mg/dL — ABNORMAL HIGH (ref 0.0–149.0)
VLDL: 52.2 mg/dL — AB (ref 0.0–40.0)

## 2013-04-01 LAB — CBC WITH DIFFERENTIAL/PLATELET
Basophils Absolute: 0 10*3/uL (ref 0.0–0.1)
Basophils Relative: 0.4 % (ref 0.0–3.0)
EOS ABS: 0.3 10*3/uL (ref 0.0–0.7)
Eosinophils Relative: 6.1 % — ABNORMAL HIGH (ref 0.0–5.0)
HCT: 41.6 % (ref 39.0–52.0)
Hemoglobin: 14.4 g/dL (ref 13.0–17.0)
LYMPHS PCT: 36.9 % (ref 12.0–46.0)
Lymphs Abs: 2 10*3/uL (ref 0.7–4.0)
MCHC: 34.7 g/dL (ref 30.0–36.0)
MCV: 86.8 fl (ref 78.0–100.0)
MONOS PCT: 7.4 % (ref 3.0–12.0)
Monocytes Absolute: 0.4 10*3/uL (ref 0.1–1.0)
NEUTROS PCT: 49.2 % (ref 43.0–77.0)
Neutro Abs: 2.6 10*3/uL (ref 1.4–7.7)
PLATELETS: 181 10*3/uL (ref 150.0–400.0)
RBC: 4.79 Mil/uL (ref 4.22–5.81)
RDW: 13.3 % (ref 11.5–14.6)
WBC: 5.3 10*3/uL (ref 4.5–10.5)

## 2013-04-01 LAB — HEMOGLOBIN A1C: HEMOGLOBIN A1C: 5.3 % (ref 4.6–6.5)

## 2013-04-01 LAB — LDL CHOLESTEROL, DIRECT: Direct LDL: 126.4 mg/dL

## 2013-04-01 LAB — PSA: PSA: 1.18 ng/mL (ref 0.10–4.00)

## 2013-04-01 LAB — TSH: TSH: 1.28 u[IU]/mL (ref 0.35–5.50)

## 2013-04-01 MED ORDER — AMITRIPTYLINE HCL 100 MG PO TABS: 100.0000 mg | ORAL_TABLET | Freq: Every day | ORAL | Status: DC

## 2013-04-01 MED ORDER — OMEPRAZOLE 40 MG PO CPDR: 40.0000 mg | DELAYED_RELEASE_CAPSULE | Freq: Every day | ORAL | Status: DC

## 2013-04-01 MED ORDER — AMLODIPINE BESYLATE 5 MG PO TABS: 5.0000 mg | ORAL_TABLET | Freq: Every day | ORAL | Status: DC

## 2013-04-01 MED ORDER — LAMOTRIGINE 200 MG PO TABS: 200.0000 mg | ORAL_TABLET | Freq: Every day | ORAL | Status: DC

## 2013-04-01 NOTE — Patient Instructions (Addendum)
Please continue all other medications as before Please have the pharmacy call with any other refills you may need. Please continue your efforts at being more active, low cholesterol diet, and weight control. You are otherwise up to date with prevention measures today. Please go to the LAB in the Basement (turn left off the elevator) for the tests to be done today You will be contacted by phone if any changes need to be made immediately.  Otherwise, you will receive a letter about your results with an explanation, but please check with MyChart first.  Please remember to sign up for My Chart if you have not done so, as this will be important to you in the future with finding out test results, communicating by private email, and scheduling acute appointments online when needed.  You will be contacted regarding the referral for: colonoscopy with Dr Russella DarStark  Please return in 6 months, or sooner if needed

## 2013-04-01 NOTE — Progress Notes (Signed)
Subjective:    Patient ID: [name], male    DOB: 12-11-1955, 58 y.o.   MRN: [id number]  HPI   Here for wellness and f/u;  Overall doing ok;  Pt denies CP, worsening SOB, DOE, wheezing, orthopnea, PND, worsening LE edema, palpitations, dizziness or syncope.  Pt denies neurological change such as new headache, facial or extremity weakness.  Pt denies polydipsia, polyuria, or low sugar symptoms. Pt states overall good compliance with treatment and medications, good tolerability, and has been trying to follow lower cholesterol diet.  Pt denies worsening depressive symptoms, suicidal ideation or panic. No fever, night sweats, wt loss, loss of appetite, or other constitutional symptoms.  Pt states good ability with ADL's, has low fall risk, home safety reviewed and adequate, no other significant changes in hearing or vision, and only occasionally active with exercise.  Saw pain management MD last MOn, with left tennis elbow, right knee DJD s/p surgury - for cortisone next visit mar 2015. Denies worsening depressive symptoms, suicidal ideation, or panic  Walks dog 5 times per day,1/4 mile each Past Medical History  Diagnosis Date  . Hypertension   . Seizures   . Personal history of venous thrombosis and embolism 02/01/2007  . RASH-NONVESICULAR 05/12/2007  . INSOMNIA-SLEEP DISORDER-UNSPEC 03/07/2009  . SEIZURE DISORDER 09/29/2006  . FIBROMYALGIA 02/01/2007  . ROTATOR CUFF SYNDROME, RIGHT 02/01/2007  . BACK PAIN 05/12/2007  . NECK PAIN, LEFT 03/07/2009  . DISC DISEASE, CERVICAL 02/01/2007  . SPONDYLOSIS, LUMBAR 02/01/2007  . PEPTIC ULCER DISEASE 02/01/2007  . GERD 09/29/2006  . BRONCHITIS, ACUTE 02/27/2009  . HYPERTENSION 09/29/2006  . PAIN, CHRONIC NEC 09/29/2006  . SLEEP APNEA, OBSTRUCTIVE, MODERATE 09/29/2006  . DEPRESSION 09/29/2006  . ANXIETY 09/29/2006  . PROTEIN C DEFICIENCY 09/29/2006  . ANEMIA, PROTEIN-DEFICIENCY 09/29/2006  . Impaired glucose tolerance 04/02/2011   Past Surgical History  Procedure  Laterality Date  . Appendectomy    . Electrical stimulator in back      reports that he has never smoked. He has never used smokeless tobacco. He reports that he does not drink alcohol or use illicit drugs. family history includes Hypertension in his other. Allergies  Allergen Reactions  . Topiramate    Current Outpatient Prescriptions on File Prior to Visit  Medication Sig Dispense Refill  . clonazePAM (KLONOPIN) 2 MG tablet Take 1 tablet (2 mg total) by mouth at bedtime as needed.  30 tablet  5  . HYDROmorphone (DILAUDID) 2 MG tablet Take 2 mg by mouth every 6 (six) hours as needed.        . hyoscyamine (LEVSIN SL) 0.125 MG SL tablet Place 1 tablet (0.125 mg total) under the tongue every 4 (four) hours as needed for cramping.  30 tablet  1   No current facility-administered medications on file prior to visit.   Review of Systems  Constitutional: Negative for unexpected weight change, or unusual diaphoresis  HENT: Negative for tinnitus.   Eyes: Negative for photophobia and visual disturbance.  Respiratory: Negative for choking and stridor.   Gastrointestinal: Negative for vomiting and blood in stool.  Genitourinary: Negative for hematuria and decreased urine volume.  Musculoskeletal: Negative for acute joint swelling Skin: Negative for color change and wound.  Neurological: Negative for tremors and numbness other than noted  Psychiatric/Behavioral: Negative for decreased concentration or  hyperactivity.       Objective:   Physical Exam BP 128/90  Pulse 87  Temp(Src) 97.1 F (36.2 C) (Oral)  Ht 5\' 7"  (  1.702 m)  Wt 200 lb 12 oz (91.06 kg)  BMI 31.43 kg/m2  SpO2 97% VS noted,  Constitutional: Pt appears well-developed and well-nourished.  HENT: Head: NCAT.  Right Ear: External ear normal.  Left Ear: External ear normal.  Eyes: Conjunctivae and EOM are normal. Pupils are equal, round, and reactive to light.  Neck: Normal range of motion. Neck supple.  Cardiovascular:  Normal rate and regular rhythm.   Pulmonary/Chest: Effort normal and breath sounds normal.  Abd:  Soft, NT, non-distended, + BS Neurological: Pt is alert. Not confused  Skin: Skin is warm. No erythema.  Psychiatric: Pt behavior is normal. Thought content normal. not depressed affect    Assessment & Plan:

## 2013-04-01 NOTE — Progress Notes (Signed)
Pre visit review using our clinic review tool, if applicable. No additional management support is needed unless otherwise documented below in the visit note. 

## 2013-04-04 NOTE — Assessment & Plan Note (Signed)
stable overall by history and exam, recent data reviewed with pt, and pt to continue medical treatment as before,  to f/u any worsening symptoms or concerns Lab Results  Component Value Date   WBC 5.3 04/01/2013   HGB 14.4 04/01/2013   HCT 41.6 04/01/2013   PLT 181.0 04/01/2013   GLUCOSE 72 04/01/2013   CHOL 198 04/01/2013   TRIG 261.0* 04/01/2013   HDL 31.30* 04/01/2013   LDLDIRECT 126.4 04/01/2013   LDLCALC 99 03/30/2012   ALT 35 04/01/2013   AST 30 04/01/2013   NA 141 04/01/2013   K 4.2 04/01/2013   CL 106 04/01/2013   CREATININE 1.2 04/01/2013   BUN 17 04/01/2013   CO2 29 04/01/2013   TSH 1.28 04/01/2013   PSA 1.18 04/01/2013   INR 1.0 02/03/2008   HGBA1C 5.3 04/01/2013

## 2013-04-04 NOTE — Assessment & Plan Note (Signed)
stable overall by history and exam, recent data reviewed with pt, and pt to continue medical treatment as before,  to f/u any worsening symptoms or concerns Lab Results  Component Value Date   HGBA1C 5.3 04/01/2013

## 2013-04-04 NOTE — Assessment & Plan Note (Signed)
For cont'd pain management f/u, cont meds

## 2013-04-04 NOTE — Assessment & Plan Note (Signed)

## 2013-04-04 NOTE — Assessment & Plan Note (Signed)
stable overall by history and exam, recent data reviewed with pt, and pt to continue medical treatment as before,  to f/u any worsening symptoms or concerns BP Readings from Last 3 Encounters:  04/01/13 128/90  09/28/12 114/78  03/30/12 110/70

## 2013-04-13 ENCOUNTER — Telehealth: Payer: Self-pay | Admitting: *Deleted

## 2013-04-13 NOTE — Telephone Encounter (Signed)
Patient phoned requesting results from labs last week.  Please advise.  CB# [phone]

## 2013-04-13 NOTE — Telephone Encounter (Signed)
Called the patient back left detailed message of lab results and to call back with any questions.

## 2013-04-15 ENCOUNTER — Other Ambulatory Visit: Payer: Self-pay | Admitting: Internal Medicine

## 2013-04-15 NOTE — Telephone Encounter (Signed)
Done hardcopy to robin  

## 2013-04-15 NOTE — Telephone Encounter (Signed)
Faxed hardcopy to [facility] [place] 

## 2013-04-16 ENCOUNTER — Encounter: Payer: Self-pay | Admitting: Gastroenterology

## 2013-05-27 ENCOUNTER — Ambulatory Visit (AMBULATORY_SURGERY_CENTER): Payer: Self-pay

## 2013-05-27 VITALS — Ht 68.0 in | Wt 190.0 lb

## 2013-05-27 DIAGNOSIS — Z1211 Encounter for screening for malignant neoplasm of colon: Secondary | ICD-10-CM

## 2013-05-27 MED ORDER — MOVIPREP 100 G PO SOLR: 1.0000 | Freq: Once | ORAL | Status: DC

## 2013-06-09 ENCOUNTER — Telehealth: Payer: Self-pay | Admitting: Gastroenterology

## 2013-06-09 NOTE — Telephone Encounter (Signed)
Spoke with pt, pt states prep is to high, advised pt I would call pharmacy and give a coupon for free prep, called pharmacy gave information, advised pt he could pick up prep when he was ready, pt states understanding-adm

## 2013-06-10 ENCOUNTER — Encounter: Payer: Self-pay | Admitting: Gastroenterology

## 2013-06-10 ENCOUNTER — Ambulatory Visit (AMBULATORY_SURGERY_CENTER): Payer: Commercial Managed Care - HMO | Admitting: Gastroenterology

## 2013-06-10 VITALS — BP 111/63 | HR 74 | Temp 97.8°F | Resp 10 | Ht 68.0 in | Wt 190.0 lb

## 2013-06-10 DIAGNOSIS — Z1211 Encounter for screening for malignant neoplasm of colon: Secondary | ICD-10-CM

## 2013-06-10 MED ORDER — SODIUM CHLORIDE 0.9 % IV SOLN: 500.0000 mL | INTRAVENOUS | Status: DC

## 2013-06-10 NOTE — Patient Instructions (Signed)
YOU HAD AN ENDOSCOPIC PROCEDURE TODAY AT THE [facility]: Refer to the procedure report that was given to you for any specific questions about what was found during the examination.  If the procedure report does not answer your questions, please call your gastroenterologist to clarify.  If you requested that your care partner not be given the details of your procedure findings, then the procedure report has been included in a sealed envelope for you to review at your convenience later.  YOU SHOULD EXPECT: Some feelings of bloating in the abdomen. Passage of more gas than usual.  Walking can help get rid of the air that was put into your GI tract during the procedure and reduce the bloating. If you had a lower endoscopy (such as a colonoscopy or flexible sigmoidoscopy) you may notice spotting of blood in your stool or on the toilet paper. If you underwent a bowel prep for your procedure, then you may not have a normal bowel movement for a few days.  DIET: Your first meal following the procedure should be a light meal and then it is ok to progress to your normal diet.  A half-sandwich or bowl of soup is an example of a good first meal.  Heavy or fried foods are harder to digest and may make you feel nauseous or bloated.  Likewise meals heavy in dairy and vegetables can cause extra gas to form and this can also increase the bloating.  Drink plenty of fluids but you should avoid alcoholic beverages for 24 hours.  ACTIVITY: Your care partner should take you home directly after the procedure.  You should plan to take it easy, moving slowly for the rest of the day.  You can resume normal activity the day after the procedure however you should NOT DRIVE or use heavy machinery for 24 hours (because of the sedation medicines used during the test).    SYMPTOMS TO REPORT IMMEDIATELY: A gastroenterologist can be reached at any hour.  During normal business hours, 8:30 AM to 5:00 PM Monday through Friday,  call [phone].  After hours and on weekends, please call the GI answering service at [phone] who will take a message and have the physician on call contact you.   Following lower endoscopy (colonoscopy or flexible sigmoidoscopy):  Excessive amounts of blood in the stool  Significant tenderness or worsening of abdominal pains  Swelling of the abdomen that is new, acute  Fever of 100F or higher    FOLLOW UP: If any biopsies were taken you will be contacted by phone or by letter within the next 1-3 weeks.  Call your gastroenterologist if you have not heard about the biopsies in 3 weeks.  Our staff will call the home number listed on your records the next business day following your procedure to check on you and address any questions or concerns that you may have at that time regarding the information given to you following your procedure. This is a courtesy call and so if there is no answer at the home number and we have not heard from you through the emergency physician on call, we will assume that you have returned to your regular daily activities without incident.  SIGNATURES/CONFIDENTIALITY: You and/or your care partner have signed paperwork which will be entered into your electronic medical record.  These signatures attest to the fact that that the information above on your After Visit Summary has been reviewed and is understood.  Full responsibility of the confidentiality   of this discharge information lies with you and/or your care-partner.  Hemorrhoid information given.  Recall colonoscopy 10 years-2025.

## 2013-06-10 NOTE — Progress Notes (Signed)
Informed Dr Russella DarStark and Renae FicklePaul Maholtz,CRNA that pt drank about a cup of water @ 0900 on way here this am . 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

## 2013-06-10 NOTE — Progress Notes (Signed)
Report to pacu rn, vss, bbs=clear 

## 2013-06-10 NOTE — Op Note (Addendum)
[facility] [address] [place], [id number]   COLONOSCOPY PROCEDURE REPORT  PATIENT: [name], Austin E.  MR#: [id number] BIRTHDATE: November 15, 1955 , 57  yrs. old GENDER: Male ENDOSCOPIST: Meryl DareMalcolm T Hunt Zajicek, MD, [facility] PROCEDURE DATE:  06/10/2013 PROCEDURE:   Colonoscopy, screening First Screening Colonoscopy - Avg.  risk and is 50 yrs.  old or older - No.  Prior Negative Screening - Now for repeat screening. 10 or more years since last screening  History of Adenoma - Now for follow-up colonoscopy & has been > or = to 3 yrs.  N/A  Polyps Removed Today? No.  Recommend repeat exam, <10 yrs? No. ASA CLASS:   Class II INDICATIONS:average risk screening and Last colonoscopy performed 10 years ago. MEDICATIONS: MAC sedation, administered by CRNA and propofol (Diprivan) 250mg  IV DESCRIPTION OF PROCEDURE:   After the risks benefits and alternatives of the procedure were thoroughly explained, informed consent was obtained.  A digital rectal exam revealed no abnormalities of the rectum.   The LB [id number]  endoscope was introduced through the anus and advanced to the cecum, which was identified by both the appendix and ileocecal valve. No adverse events experienced.   The quality of the prep was adequate, using MoviPrep  The instrument was then slowly withdrawn as the colon was fully examined.  COLON FINDINGS: A normal appearing cecum, ileocecal valve, and appendiceal orifice were identified.  The ascending, hepatic flexure, transverse, splenic flexure, descending, sigmoid colon and rectum appeared unremarkable.  No polyps or cancers were seen. Retroflexed views revealed small internal hemorrhoids. The time to cecum=3 minutes 40 seconds.  Withdrawal time=10 minutes 51 seconds. The scope was withdrawn and the procedure completed.  COMPLICATIONS: There were no complications.  ENDOSCOPIC IMPRESSION: 1.  Normal colon 2.  Small internal  hemorrhoids  RECOMMENDATIONS: 1.  Continue to follow colorectal cancer screening guidelines for "routine risk" patients with a repeat colonoscopy in 10 years with a more extensive bowel prep.  There is no need for routine, screening FOBT (stool) testing for at least 5 years.  eSigned:  Meryl DareMalcolm T Loriene Taunton, MD, [facility] 06/10/2013 11:38 AM Revised: 06/10/2013 11:38 AM

## 2013-06-11 ENCOUNTER — Telehealth: Payer: Self-pay | Admitting: *Deleted

## 2013-06-11 NOTE — Telephone Encounter (Signed)
Name identifier, left message, follow-up 

## 2013-06-21 ENCOUNTER — Telehealth: Payer: Self-pay | Admitting: *Deleted

## 2013-06-21 ENCOUNTER — Telehealth: Payer: Self-pay | Admitting: Internal Medicine

## 2013-06-21 DIAGNOSIS — G894 Chronic pain syndrome: Secondary | ICD-10-CM

## 2013-06-21 NOTE — Telephone Encounter (Signed)
Done per emr 

## 2013-06-21 NOTE — Telephone Encounter (Signed)
[employer] authorization # [id number] exp 09/19/13 start date 06/21/13 good for 4 visits

## 2013-06-21 NOTE — Telephone Encounter (Signed)
Pt called requesting pain management referral.  Please advise

## 2013-06-21 NOTE — Telephone Encounter (Signed)
Faxed to [employer] @ [phone] for Dr Cannon Kettlehristopher Gilmore, awaiting Berkley Harveyauth

## 2013-09-17 ENCOUNTER — Other Ambulatory Visit: Payer: Self-pay

## 2013-09-17 MED ORDER — AMITRIPTYLINE HCL 100 MG PO TABS: 100.0000 mg | ORAL_TABLET | Freq: Every day | ORAL | Status: DC

## 2013-09-29 ENCOUNTER — Ambulatory Visit: Payer: Medicare PPO | Admitting: Internal Medicine

## 2013-09-30 ENCOUNTER — Encounter: Payer: Self-pay | Admitting: Internal Medicine

## 2013-09-30 ENCOUNTER — Other Ambulatory Visit (INDEPENDENT_AMBULATORY_CARE_PROVIDER_SITE_OTHER): Payer: Commercial Managed Care - HMO

## 2013-09-30 ENCOUNTER — Ambulatory Visit (INDEPENDENT_AMBULATORY_CARE_PROVIDER_SITE_OTHER): Payer: Commercial Managed Care - HMO | Admitting: Internal Medicine

## 2013-09-30 VITALS — BP 112/80 | HR 82 | Temp 98.0°F | Ht 67.5 in | Wt 195.2 lb

## 2013-09-30 DIAGNOSIS — Z0000 Encounter for general adult medical examination without abnormal findings: Secondary | ICD-10-CM

## 2013-09-30 DIAGNOSIS — R5381 Other malaise: Secondary | ICD-10-CM

## 2013-09-30 DIAGNOSIS — R569 Unspecified convulsions: Secondary | ICD-10-CM

## 2013-09-30 DIAGNOSIS — R5383 Other fatigue: Principal | ICD-10-CM

## 2013-09-30 DIAGNOSIS — I10 Essential (primary) hypertension: Secondary | ICD-10-CM

## 2013-09-30 DIAGNOSIS — R7309 Other abnormal glucose: Secondary | ICD-10-CM

## 2013-09-30 DIAGNOSIS — G894 Chronic pain syndrome: Secondary | ICD-10-CM

## 2013-09-30 DIAGNOSIS — R7302 Impaired glucose tolerance (oral): Secondary | ICD-10-CM

## 2013-09-30 DIAGNOSIS — F3289 Other specified depressive episodes: Secondary | ICD-10-CM

## 2013-09-30 DIAGNOSIS — F329 Major depressive disorder, single episode, unspecified: Secondary | ICD-10-CM

## 2013-09-30 LAB — TESTOSTERONE: Testosterone: 222.41 ng/dL — ABNORMAL LOW (ref 300.00–890.00)

## 2013-09-30 MED ORDER — HYDROMORPHONE HCL 2 MG PO TABS: 4.0000 mg | ORAL_TABLET | Freq: Four times a day (QID) | ORAL | Status: DC | PRN

## 2013-09-30 NOTE — Assessment & Plan Note (Signed)
Lok for [employer]arn[employer] level -

## 2013-09-30 NOTE — Assessment & Plan Note (Signed)
stable overall by history and exam, recent data reviewed with pt, and pt to continue medical treatment as before,  to f/u any worsening symptoms or concerns Lab Results  Component Value Date   HGBA1C 5.3 04/01/2013

## 2013-09-30 NOTE — Progress Notes (Signed)
Subjective:    Patient ID: [name], male    DOB: [birth year], 58 y.o.   MRN: [id number]  HPI  Here to f/u; overall doing ok,  Pt denies chest pain, increased sob or doe, wheezing, orthopnea, PND, increased LE swelling, palpitations, dizziness or syncope.  Pt denies polydipsia, polyuria, or low sugar symptoms such as weakness or confusion improved with po intake.  Pt denies new neurological symptoms such as new headache, or facial or extremity weakness or numbness.   Pt states overall good compliance with meds, has been trying to follow lower cholesterol diet, with wt overall stable.  No recent siezure activity, Overall good compliance with treatment, and good medicine tolerability.  Does c/o ongoing fatigue, but denies signficant daytime hypersomnolence.  Asks for testosterone check.  Needs pain clinic referral, also form filled out for DM Past Medical History  Diagnosis Date  . Hypertension   . Seizures   . Personal history of venous thrombosis and embolism 02/01/2007  . RASH-NONVESICULAR 05/12/2007  . INSOMNIA-SLEEP DISORDER-UNSPEC 03/07/2009  . SEIZURE DISORDER 09/29/2006  . FIBROMYALGIA 02/01/2007  . ROTATOR CUFF SYNDROME, RIGHT 02/01/2007  . BACK PAIN 05/12/2007  . NECK PAIN, LEFT 03/07/2009  . DISC DISEASE, CERVICAL 02/01/2007  . SPONDYLOSIS, LUMBAR 02/01/2007  . PEPTIC ULCER DISEASE 02/01/2007  . GERD 09/29/2006  . BRONCHITIS, ACUTE 02/27/2009  . HYPERTENSION 09/29/2006  . PAIN, CHRONIC NEC 09/29/2006  . SLEEP APNEA, OBSTRUCTIVE, MODERATE 09/29/2006  . DEPRESSION 09/29/2006  . ANXIETY 09/29/2006  . PROTEIN C DEFICIENCY 09/29/2006  . ANEMIA, PROTEIN-DEFICIENCY 09/29/2006  . Impaired glucose tolerance 04/02/2011   Past Surgical History  Procedure Laterality Date  . Appendectomy    . Electrical stimulator in back    . Knee arthroscopy      right    reports that he has never smoked. He has never used smokeless tobacco. He reports that he does not drink alcohol or use illicit drugs. family  history includes Hypertension in his other. There is no history of Colon cancer. Allergies  Allergen Reactions  . Topiramate    Current Outpatient Prescriptions on File Prior to Visit  Medication Sig Dispense Refill  . amitriptyline (ELAVIL) 100 MG tablet Take 1 tablet (100 mg total) by mouth at bedtime.  90 tablet  1  . amLODipine (NORVASC) 5 MG tablet Take 1 tablet (5 mg total) by mouth daily.  90 tablet  3  . clonazePAM (KLONOPIN) 2 MG tablet TAKE 1 TABLET BY MOUTH AT BEDTIME AS NEEDED  30 tablet  5  . lamoTRIgine (LAMICTAL) 200 MG tablet Take 1 tablet (200 mg total) by mouth daily.  90 tablet  3  . omeprazole (PRILOSEC) 40 MG capsule Take 1 capsule (40 mg total) by mouth daily.  90 capsule  3  . hyoscyamine (LEVSIN SL) 0.125 MG SL tablet Place 1 tablet (0.125 mg total) under the tongue every 4 (four) hours as needed for cramping.  30 tablet  1   No current facility-administered medications on file prior to visit.   Review of Systems  Constitutional: Negative for unusual diaphoresis or other sweats  HENT: Negative for ringing in ear Eyes: Negative for double vision or worsening visual disturbance.  Respiratory: Negative for choking and stridor.   Gastrointestinal: Negative for vomiting or other signifcant bowel change Genitourinary: Negative for hematuria or decreased urine volume.  Musculoskeletal: Negative for other MSK pain or swelling Skin: Negative for color change and worsening wound.  Neurological: Negative for tremors and numbness other  than noted  Psychiatric/Behavioral: Negative for decreased concentration or agitation other than above       Objective:   Physical Exam BP 112/80  Pulse 82  Temp(Src) 98 F (36.7 C) (Oral)  Ht 5' 7.5" (1.715 m)  Wt 195 lb 4 oz (88.565 kg)  BMI 30.11 kg/m2  SpO2 95% VS noted,  Constitutional: Pt appears well-developed, well-nourished.  HENT: Head: NCAT.  Right Ear: External ear normal.  Left Ear: External ear normal.  Eyes: .  Pupils are equal, round, and reactive to light. Conjunctivae and EOM are normal Neck: Normal range of motion. Neck supple.  Cardiovascular: Normal rate and regular rhythm.   Pulmonary/Chest: Effort normal and breath sounds normal.  Abd:  Soft, NT, ND, + BS Neurological: Pt is alert. Not confused , motor grossly intact Skin: Skin is warm. No rash Psychiatric: Pt behavior is normal. No agitation. + chronic dysphoric affect   Assessment & Plan:

## 2013-09-30 NOTE — Progress Notes (Signed)
Pre visit review using our clinic review tool, if applicable. No additional management support is needed unless otherwise documented below in the visit note. 

## 2013-09-30 NOTE — Assessment & Plan Note (Addendum)
Stable, [facility]

## 2013-09-30 NOTE — Assessment & Plan Note (Signed)
stable overall by history and exam, recent data reviewed with pt, and pt to continue medical treatment as before,  to f/u any worsening symptoms or concerns BP Readings from Last 3 Encounters:  09/30/13 112/80  06/10/13 111/63  04/01/13 128/90   '

## 2013-09-30 NOTE — Patient Instructions (Addendum)
Please continue all other medications as before, and refills have been done if requested.  Please have the pharmacy call with any other refills you may need.  Please continue your efforts at being more active, low cholesterol diet, and weight control.  You are otherwise up to date with prevention measures today.  Please keep your appointments with your specialists as you may have planned  You will be contacted regarding the referral for: pain clinic  Please go to the LAB in the Basement (turn left off the elevator) for the tests to be done today  You will be contacted by phone if any changes need to be made immediately.  Otherwise, you will receive a letter about your results with an explanation, but please check with MyChart first.  Please remember to sign up for MyChart if you have not done so, as this will be important to you in the future with finding out test results, communicating by private email, and scheduling acute appointments online when needed.  Please return in 6 months, or sooner if needed, with Lab testing done 3-5 days before

## 2013-10-03 NOTE — Assessment & Plan Note (Signed)
stable overall by history and exam, recent data reviewed with pt, and pt to continue medical treatment as before,  to f/u any worsening symptoms or concerns Lab Results  Component Value Date   WBC 5.3 04/01/2013   HGB 14.4 04/01/2013   HCT 41.6 04/01/2013   PLT 181.0 04/01/2013   GLUCOSE 72 04/01/2013   CHOL 198 04/01/2013   TRIG 261.0* 04/01/2013   HDL 31.30* 04/01/2013   LDLDIRECT 126.4 04/01/2013   LDLCALC 99 03/30/2012   ALT 35 04/01/2013   AST 30 04/01/2013   NA 141 04/01/2013   K 4.2 04/01/2013   CL 106 04/01/2013   CREATININE 1.2 04/01/2013   BUN 17 04/01/2013   CO2 29 04/01/2013   TSH 1.28 04/01/2013   PSA 1.18 04/01/2013   INR 1.0 02/03/2008   HGBA1C 5.3 04/01/2013

## 2013-10-13 ENCOUNTER — Other Ambulatory Visit: Payer: Self-pay | Admitting: Internal Medicine

## 2013-10-13 NOTE — Telephone Encounter (Signed)
Done hardcopy to robin  

## 2013-10-13 NOTE — Telephone Encounter (Signed)
Faxed hardcopy to [facility] [place] 

## 2013-10-27 ENCOUNTER — Ambulatory Visit (INDEPENDENT_AMBULATORY_CARE_PROVIDER_SITE_OTHER): Payer: Commercial Managed Care - HMO | Admitting: Internal Medicine

## 2013-10-27 ENCOUNTER — Ambulatory Visit (INDEPENDENT_AMBULATORY_CARE_PROVIDER_SITE_OTHER)
Admission: RE | Admit: 2013-10-27 | Discharge: 2013-10-27 | Disposition: A | Discharge status: Home / Self Care | Payer: Commercial Managed Care - HMO | Source: Ambulatory Visit | Attending: Internal Medicine | Admitting: Internal Medicine

## 2013-10-27 ENCOUNTER — Encounter: Payer: Self-pay | Admitting: Internal Medicine

## 2013-10-27 VITALS — BP 119/74 | HR 84 | Temp 98.3°F | Ht 67.5 in | Wt 195.8 lb

## 2013-10-27 DIAGNOSIS — M79609 Pain in unspecified limb: Secondary | ICD-10-CM

## 2013-10-27 DIAGNOSIS — M79672 Pain in left foot: Secondary | ICD-10-CM | POA: Insufficient documentation

## 2013-10-27 DIAGNOSIS — R7302 Impaired glucose tolerance (oral): Secondary | ICD-10-CM

## 2013-10-27 DIAGNOSIS — R7309 Other abnormal glucose: Secondary | ICD-10-CM

## 2013-10-27 DIAGNOSIS — I10 Essential (primary) hypertension: Secondary | ICD-10-CM

## 2013-10-27 MED ORDER — METHYLPREDNISOLONE ACETATE 80 MG/ML IJ SUSP
120.0000 mg | Freq: Once | INTRAMUSCULAR | Status: AC
  Administered 2013-10-27: 120 mg via INTRAMUSCULAR

## 2013-10-27 MED ORDER — PREDNISONE 10 MG PO TABS: ORAL_TABLET | ORAL | Status: DC

## 2013-10-27 NOTE — Progress Notes (Signed)
Pre visit review using our clinic review tool, if applicable. No additional management support is needed unless otherwise documented below in the visit note. 

## 2013-10-27 NOTE — Patient Instructions (Signed)
You had the steroid shot today  Please take all new medication as prescribed - the prednisone  Please continue all other medications as before, including the diluadid  Please have the pharmacy call with any other refills you may need.  Please keep your appointments with your specialists as you may have planned  You will be contacted regarding the referral for: podiatry, although you can consider cancelling the appointment if your pain and swelling go away in the next few days with the treatment

## 2013-10-27 NOTE — Assessment & Plan Note (Signed)
Acute left first MTP primarily with swelling/marked tender, sudden onset, dilaudid not really helping, hard to keep walking on side of foot, atraumatic, no fever; diff includes OA flare vs acute gout; today for film, also depomedrol IM, predpack asd, cont dilaudid asd, refer podiatyr if not improved in next 1-3 days

## 2013-10-27 NOTE — Progress Notes (Signed)
Subjective:    Patient ID: [name], male    DOB: 06/12/1955, 58 y.o.   MRN: [id number]  HPI Here with acute onset left first MTP primarily with swelling/marked tender/sharp pain, mild erythema, sudden onset x 2 days, dilaudid not really helping, hard to keep walking on side of foot, atraumatic, no fever.   Pt denies polydipsia, polyuria,. Pt denies chest pain, increased sob or doe, wheezing, orthopnea, PND, increased LE swelling, palpitations, dizziness or syncope. Past Medical History  Diagnosis Date  . Hypertension   . Seizures   . Personal history of venous thrombosis and embolism 02/01/2007  . RASH-NONVESICULAR 05/12/2007  . INSOMNIA-SLEEP DISORDER-UNSPEC 03/07/2009  . SEIZURE DISORDER 09/29/2006  . FIBROMYALGIA 02/01/2007  . ROTATOR CUFF SYNDROME, RIGHT 02/01/2007  . BACK PAIN 05/12/2007  . NECK PAIN, LEFT 03/07/2009  . DISC DISEASE, CERVICAL 02/01/2007  . SPONDYLOSIS, LUMBAR 02/01/2007  . PEPTIC ULCER DISEASE 02/01/2007  . GERD 09/29/2006  . BRONCHITIS, ACUTE 02/27/2009  . HYPERTENSION 09/29/2006  . PAIN, CHRONIC NEC 09/29/2006  . SLEEP APNEA, OBSTRUCTIVE, MODERATE 09/29/2006  . DEPRESSION 09/29/2006  . ANXIETY 09/29/2006  . PROTEIN C DEFICIENCY 09/29/2006  . ANEMIA, PROTEIN-DEFICIENCY 09/29/2006  . Impaired glucose tolerance 04/02/2011   Past Surgical History  Procedure Laterality Date  . Appendectomy    . Electrical stimulator in back    . Knee arthroscopy      right    reports that he has never smoked. He has never used smokeless tobacco. He reports that he does not drink alcohol or use illicit drugs. family history includes Hypertension in his other. There is no history of Colon cancer. Allergies  Allergen Reactions  . Topiramate    Current Outpatient Prescriptions on File Prior to Visit  Medication Sig Dispense Refill  . amitriptyline (ELAVIL) 100 MG tablet Take 1 tablet (100 mg total) by mouth at bedtime.  90 tablet  1  . amLODipine (NORVASC) 5 MG tablet Take 1 tablet (5 mg  total) by mouth daily.  90 tablet  3  . clonazePAM (KLONOPIN) 2 MG tablet TAKE 1 TABLET BY MOUTH AT BEDTIME AS NEEDED  30 tablet  5  . HYDROmorphone (DILAUDID) 2 MG tablet Take 2 tablets (4 mg total) by mouth every 6 (six) hours as needed.  30 tablet  0  . lamoTRIgine (LAMICTAL) 200 MG tablet Take 1 tablet (200 mg total) by mouth daily.  90 tablet  3  . omeprazole (PRILOSEC) 40 MG capsule Take 1 capsule (40 mg total) by mouth daily.  90 capsule  3  . hyoscyamine (LEVSIN SL) 0.125 MG SL tablet Place 1 tablet (0.125 mg total) under the tongue every 4 (four) hours as needed for cramping.  30 tablet  1   No current facility-administered medications on file prior to visit.   Review of Systems  Constitutional: Negative for unusual diaphoresis or other sweats  HENT: Negative for ringing in ear Eyes: Negative for double vision or worsening visual disturbance.  Respiratory: Negative for choking and stridor.   Gastrointestinal: Negative for vomiting or other signifcant bowel change Genitourinary: Negative for hematuria or decreased urine volume.  Musculoskeletal: Negative for other MSK pain or swelling Skin: Negative for color change and worsening wound.  Neurological: Negative for tremors and numbness other than noted  Psychiatric/Behavioral: Negative for decreased concentration or agitation other than above       Objective:   Physical Exam VS noted,  Constitutional: Pt appears well-developed, well-nourished.  HENT: Head: NCAT.  Right  Ear: External ear normal.  Left Ear: External ear normal.  Eyes: . Pupils are equal, round, and reactive to light. Conjunctivae and EOM are normal Neck: Normal range of motion. Neck supple.  Cardiovascular: Normal rate and regular rhythm.   Pulmonary/Chest: Effort normal and breath sounds normal.  Neurological: Pt is alert. Not confused , motor grossly intact Skin: Skin is warm. No rash Psychiatric: Pt behavior is normal. No agitation.  Left first MTP with  large swelling/marked tender but curiously mild erythema only    Assessment & Plan:

## 2013-10-29 ENCOUNTER — Telehealth: Payer: Self-pay | Admitting: Internal Medicine

## 2013-10-29 NOTE — Telephone Encounter (Signed)
Patient informed of results.  

## 2013-10-29 NOTE — Telephone Encounter (Signed)
Pt request result of x-ray from 10/27/13. Pt can not get into my chart right now. Please call pt

## 2013-10-31 NOTE — Assessment & Plan Note (Signed)
stable overall by history and exam, recent data reviewed with pt, and pt to continue medical treatment as before,  to f/u any worsening symptoms or concerns BP Readings from Last 3 Encounters:  10/27/13 119/74  09/30/13 112/80  06/10/13 111/63

## 2013-11-04 ENCOUNTER — Other Ambulatory Visit: Payer: Self-pay | Admitting: Internal Medicine

## 2013-11-05 NOTE — Telephone Encounter (Signed)
Do you want this rx refilled?

## 2013-12-02 ENCOUNTER — Ambulatory Visit (INDEPENDENT_AMBULATORY_CARE_PROVIDER_SITE_OTHER): Payer: Commercial Managed Care - HMO | Admitting: Internal Medicine

## 2013-12-02 ENCOUNTER — Encounter: Payer: Self-pay | Admitting: Internal Medicine

## 2013-12-02 VITALS — BP 114/68 | HR 88 | Temp 98.6°F | Wt 192.4 lb

## 2013-12-02 DIAGNOSIS — M79672 Pain in left foot: Secondary | ICD-10-CM

## 2013-12-02 DIAGNOSIS — R569 Unspecified convulsions: Secondary | ICD-10-CM

## 2013-12-02 DIAGNOSIS — I10 Essential (primary) hypertension: Secondary | ICD-10-CM

## 2013-12-02 DIAGNOSIS — Z23 Encounter for immunization: Secondary | ICD-10-CM

## 2013-12-02 DIAGNOSIS — M79609 Pain in unspecified limb: Secondary | ICD-10-CM

## 2013-12-02 MED ORDER — AMLODIPINE BESYLATE 5 MG PO TABS: 5.0000 mg | ORAL_TABLET | Freq: Every day | ORAL | Status: DC

## 2013-12-02 MED ORDER — OMEPRAZOLE 40 MG PO CPDR: 40.0000 mg | DELAYED_RELEASE_CAPSULE | Freq: Every day | ORAL | Status: DC

## 2013-12-02 NOTE — Assessment & Plan Note (Signed)
None since 2007., to cont med tx for now,  to f/u any worsening symptoms or concerns

## 2013-12-02 NOTE — Progress Notes (Signed)
Pre visit review using our clinic review tool, if applicable. No additional management support is needed unless otherwise documented below in the visit note. 

## 2013-12-02 NOTE — Progress Notes (Signed)
Subjective:    Patient ID: [name], male    DOB: 14-Dec-1955, 58 y.o.   MRN: [id number]  HPI  Here to f/u; overall doing ok,  Pt denies chest pain, increased sob or doe, wheezing, orthopnea, PND, increased LE swelling, palpitations, dizziness or syncope.  Pt denies polydipsia, polyuria, or low sugar symptoms such as weakness or confusion improved with po intake.  Pt denies new neurological symptoms such as new headache, or facial or extremity weakness or numbness.   Pt states overall good compliance with meds, has been trying to follow lower cholesterol diet, with wt overall stable,  but little exercise however, due to ongoing back pain.  Pt continues to have recurring LBP without change in severity, bowel or bladder change, fever, wt loss,  worsening LE pain/numbness/weakness, gait change or falls  Had great resolution of left foot pain/red last visit - likely gout in retrospect.  Has forms to sign for DMV and handicap parking Past Medical History  Diagnosis Date  . Hypertension   . Seizures   . Personal history of venous thrombosis and embolism 02/01/2007  . RASH-NONVESICULAR 05/12/2007  . INSOMNIA-SLEEP DISORDER-UNSPEC 03/07/2009  . SEIZURE DISORDER 09/29/2006  . FIBROMYALGIA 02/01/2007  . ROTATOR CUFF SYNDROME, RIGHT 02/01/2007  . BACK PAIN 05/12/2007  . NECK PAIN, LEFT 03/07/2009  . DISC DISEASE, CERVICAL 02/01/2007  . SPONDYLOSIS, LUMBAR 02/01/2007  . PEPTIC ULCER DISEASE 02/01/2007  . GERD 09/29/2006  . BRONCHITIS, ACUTE 02/27/2009  . HYPERTENSION 09/29/2006  . PAIN, CHRONIC NEC 09/29/2006  . SLEEP APNEA, OBSTRUCTIVE, MODERATE 09/29/2006  . DEPRESSION 09/29/2006  . ANXIETY 09/29/2006  . PROTEIN C DEFICIENCY 09/29/2006  . ANEMIA, PROTEIN-DEFICIENCY 09/29/2006  . Impaired glucose tolerance 04/02/2011   Past Surgical History  Procedure Laterality Date  . Appendectomy    . Electrical stimulator in back    . Knee arthroscopy      right    reports that he has never smoked. He has never used  smokeless tobacco. He reports that he does not drink alcohol or use illicit drugs. family history includes Hypertension in his other. There is no history of Colon cancer. Allergies  Allergen Reactions  . Topiramate    Current Outpatient Prescriptions on File Prior to Visit  Medication Sig Dispense Refill  . amitriptyline (ELAVIL) 100 MG tablet Take 1 tablet (100 mg total) by mouth at bedtime.  90 tablet  1  . clonazePAM (KLONOPIN) 2 MG tablet TAKE 1 TABLET BY MOUTH AT BEDTIME AS NEEDED  30 tablet  5  . HYDROmorphone (DILAUDID) 2 MG tablet Take 2 tablets (4 mg total) by mouth every 6 (six) hours as needed.  30 tablet  0  . lamoTRIgine (LAMICTAL) 200 MG tablet Take 1 tablet (200 mg total) by mouth daily.  90 tablet  3  . hyoscyamine (LEVSIN SL) 0.125 MG SL tablet Place 1 tablet (0.125 mg total) under the tongue every 4 (four) hours as needed for cramping.  30 tablet  1   No current facility-administered medications on file prior to visit.   Review of Systems  Constitutional: Negative for unusual diaphoresis or other sweats  HENT: Negative for ringing in ear Eyes: Negative for double vision or worsening visual disturbance.  Respiratory: Negative for choking and stridor.   Gastrointestinal: Negative for vomiting or other signifcant bowel change Genitourinary: Negative for hematuria or decreased urine volume.  Musculoskeletal: Negative for other MSK pain or swelling Skin: Negative for color change and worsening wound.  Neurological: Negative  for tremors and numbness other than noted  Psychiatric/Behavioral: Negative for decreased concentration or agitation other than above       Objective:   Physical Exam BP 114/68  Pulse 88  Temp(Src) 98.6 F (37 C) (Oral)  Wt 192 lb 6 oz (87.261 kg)  SpO2 96% VS noted,  Constitutional: Pt appears well-developed, well-nourished.  HENT: Head: NCAT.  Right Ear: External ear normal.  Left Ear: External ear normal.  Eyes: . Pupils are equal, round,  and reactive to light. Conjunctivae and EOM are normal Neck: Normal range of motion. Neck supple.  Cardiovascular: Normal rate and regular rhythm.   Pulmonary/Chest: Effort normal and breath sounds normal.  Abd:  Soft, NT, ND, + BS Neurological: Pt is alert. Not confused , motor grossly intact Skin: Skin is warm. No rash Psychiatric: Pt behavior is normal. No agitation.  No acute synovitis    Assessment & Plan:   Wt Readings from Last 3 Encounters:  12/02/13 192 lb 6 oz (87.261 kg)  10/27/13 195 lb 12 oz (88.792 kg)  09/30/13 195 lb 4 oz (88.565 kg)

## 2013-12-02 NOTE — Assessment & Plan Note (Signed)
Resolved, likely gout, to check uric acid with next labs

## 2013-12-02 NOTE — Assessment & Plan Note (Signed)
stable overall by history and exam, recent data reviewed with pt, and pt to continue medical treatment as before,  to f/u any worsening symptoms or concerns BP Readings from Last 3 Encounters:  12/02/13 114/68  10/27/13 119/74  09/30/13 112/80

## 2013-12-02 NOTE — Patient Instructions (Addendum)
You had the flu shot today  Your forms were filled out today  Please continue all other medications as before, and refills have been done if requested.  Please have the pharmacy call with any other refills you may need.  Please keep your appointments with your specialists as you may have planned

## 2013-12-15 ENCOUNTER — Encounter: Payer: Self-pay | Admitting: Gastroenterology

## 2014-03-28 DIAGNOSIS — M1711 Unilateral primary osteoarthritis, right knee: Secondary | ICD-10-CM | POA: Diagnosis not present

## 2014-03-28 DIAGNOSIS — M5416 Radiculopathy, lumbar region: Secondary | ICD-10-CM | POA: Diagnosis not present

## 2014-03-28 DIAGNOSIS — Z9889 Other specified postprocedural states: Secondary | ICD-10-CM | POA: Diagnosis not present

## 2014-03-28 DIAGNOSIS — G894 Chronic pain syndrome: Secondary | ICD-10-CM | POA: Diagnosis not present

## 2014-04-02 ENCOUNTER — Other Ambulatory Visit: Payer: Self-pay | Admitting: Internal Medicine

## 2014-04-05 DIAGNOSIS — Z9889 Other specified postprocedural states: Secondary | ICD-10-CM | POA: Diagnosis not present

## 2014-04-05 DIAGNOSIS — M5416 Radiculopathy, lumbar region: Secondary | ICD-10-CM | POA: Diagnosis not present

## 2014-04-05 DIAGNOSIS — G894 Chronic pain syndrome: Secondary | ICD-10-CM | POA: Diagnosis not present

## 2014-04-05 DIAGNOSIS — M4726 Other spondylosis with radiculopathy, lumbar region: Secondary | ICD-10-CM | POA: Diagnosis not present

## 2014-04-06 ENCOUNTER — Ambulatory Visit (INDEPENDENT_AMBULATORY_CARE_PROVIDER_SITE_OTHER): Payer: Commercial Managed Care - HMO | Admitting: Internal Medicine

## 2014-04-06 ENCOUNTER — Other Ambulatory Visit (INDEPENDENT_AMBULATORY_CARE_PROVIDER_SITE_OTHER): Payer: Commercial Managed Care - HMO

## 2014-04-06 ENCOUNTER — Encounter: Payer: Self-pay | Admitting: Internal Medicine

## 2014-04-06 VITALS — BP 126/90 | HR 86 | Temp 97.9°F | Ht 66.0 in | Wt 193.0 lb

## 2014-04-06 DIAGNOSIS — M545 Low back pain, unspecified: Secondary | ICD-10-CM

## 2014-04-06 DIAGNOSIS — E785 Hyperlipidemia, unspecified: Secondary | ICD-10-CM

## 2014-04-06 DIAGNOSIS — R7302 Impaired glucose tolerance (oral): Secondary | ICD-10-CM | POA: Diagnosis not present

## 2014-04-06 DIAGNOSIS — Z0000 Encounter for general adult medical examination without abnormal findings: Secondary | ICD-10-CM | POA: Diagnosis not present

## 2014-04-06 LAB — CBC WITH DIFFERENTIAL/PLATELET
BASOS PCT: 0.2 % (ref 0.0–3.0)
Basophils Absolute: 0 10*3/uL (ref 0.0–0.1)
Eosinophils Absolute: 0.4 10*3/uL (ref 0.0–0.7)
Eosinophils Relative: 6.3 % — ABNORMAL HIGH (ref 0.0–5.0)
HCT: 46.1 % (ref 39.0–52.0)
Hemoglobin: 15.6 g/dL (ref 13.0–17.0)
LYMPHS PCT: 33.4 % (ref 12.0–46.0)
Lymphs Abs: 2.2 10*3/uL (ref 0.7–4.0)
MCHC: 33.9 g/dL (ref 30.0–36.0)
MCV: 88.8 fl (ref 78.0–100.0)
MONOS PCT: 8.3 % (ref 3.0–12.0)
Monocytes Absolute: 0.6 10*3/uL (ref 0.1–1.0)
NEUTROS PCT: 51.8 % (ref 43.0–77.0)
Neutro Abs: 3.5 10*3/uL (ref 1.4–7.7)
PLATELETS: 193 10*3/uL (ref 150.0–400.0)
RBC: 5.19 Mil/uL (ref 4.22–5.81)
RDW: 12.6 % (ref 11.5–15.5)
WBC: 6.7 10*3/uL (ref 4.0–10.5)

## 2014-04-06 LAB — URINALYSIS, ROUTINE W REFLEX MICROSCOPIC
Bilirubin Urine: NEGATIVE
HGB URINE DIPSTICK: NEGATIVE
LEUKOCYTES UA: NEGATIVE
NITRITE: NEGATIVE
RBC / HPF: NONE SEEN (ref 0–?)
Specific Gravity, Urine: 1.025 (ref 1.000–1.030)
Total Protein, Urine: NEGATIVE
UROBILINOGEN UA: 0.2 (ref 0.0–1.0)
Urine Glucose: NEGATIVE
pH: 6 (ref 5.0–8.0)

## 2014-04-06 LAB — BASIC METABOLIC PANEL
BUN: 27 mg/dL — AB (ref 6–23)
CALCIUM: 9.5 mg/dL (ref 8.4–10.5)
CO2: 31 mEq/L (ref 19–32)
Chloride: 103 mEq/L (ref 96–112)
Creatinine, Ser: 1.18 mg/dL (ref 0.40–1.50)
GFR: 67.6 mL/min (ref >60.00)
Glucose, Bld: 77 mg/dL (ref 70–99)
Potassium: 4.2 mEq/L (ref 3.5–5.1)
Sodium: 139 mEq/L (ref 135–145)

## 2014-04-06 LAB — LIPID PANEL
CHOL/HDL RATIO: 5
Cholesterol: 172 mg/dL (ref 0–200)
HDL: 31.9 mg/dL — ABNORMAL LOW (ref >39.00)
NONHDL: 140.1
TRIGLYCERIDES: 292 mg/dL — AB (ref 0.0–149.0)
VLDL: 58.4 mg/dL — AB (ref 0.0–40.0)

## 2014-04-06 LAB — HEPATIC FUNCTION PANEL
ALBUMIN: 4.4 g/dL (ref 3.5–5.2)
ALT: 25 U/L (ref 0–53)
AST: 20 U/L (ref 0–37)
Alkaline Phosphatase: 86 U/L (ref 39–117)
Bilirubin, Direct: 0.1 mg/dL (ref 0.0–0.3)
Total Bilirubin: 0.6 mg/dL (ref 0.2–1.2)
Total Protein: 7.5 g/dL (ref 6.0–8.3)

## 2014-04-06 LAB — LDL CHOLESTEROL, DIRECT: Direct LDL: 101 mg/dL

## 2014-04-06 LAB — HEMOGLOBIN A1C: Hgb A1c MFr Bld: 5.3 % (ref 4.6–6.5)

## 2014-04-06 LAB — PSA: PSA: 1.51 ng/mL (ref 0.10–4.00)

## 2014-04-06 LAB — TSH: TSH: 1.13 u[IU]/mL (ref 0.35–4.50)

## 2014-04-06 MED ORDER — LAMOTRIGINE 200 MG PO TABS: 200.0000 mg | ORAL_TABLET | Freq: Every day | ORAL | Status: DC

## 2014-04-06 MED ORDER — CLONAZEPAM 2 MG PO TABS: 2.0000 mg | ORAL_TABLET | Freq: Every evening | ORAL | Status: DC | PRN

## 2014-04-06 MED ORDER — AMLODIPINE BESYLATE 5 MG PO TABS: 5.0000 mg | ORAL_TABLET | Freq: Every day | ORAL | Status: DC

## 2014-04-06 MED ORDER — AMITRIPTYLINE HCL 100 MG PO TABS: ORAL_TABLET | ORAL | Status: DC

## 2014-04-06 MED ORDER — ATORVASTATIN CALCIUM 10 MG PO TABS: 10.0000 mg | ORAL_TABLET | Freq: Every day | ORAL | Status: DC

## 2014-04-06 MED ORDER — OMEPRAZOLE 40 MG PO CPDR: 40.0000 mg | DELAYED_RELEASE_CAPSULE | Freq: Every day | ORAL | Status: DC

## 2014-04-06 NOTE — Patient Instructions (Signed)
Your EKG was Ok today  Please take all new medication as prescribed - the lipitor at 10 mg per day  Please continue all other medications as before, and refills have been done if requested, including the clonopin  Please have the pharmacy call with any other refills you may need.  Please continue your efforts at being more active, low cholesterol diet, and weight control.  You are otherwise up to date with prevention measures today.  Please keep your appointments with your specialists as you may have planned  You will be contacted regarding the referral for: pain clinic  Please go to the LAB in the Basement (turn left off the elevator) for the tests to be done today  You will be contacted by phone if any changes need to be made immediately.  Otherwise, you will receive a letter about your results with an explanation, but please check with MyChart first.  Please remember to sign up for MyChart if you have not done so, as this will be important to you in the future with finding out test results, communicating by private email, and scheduling acute appointments online when needed.  Please return in 6 months, or sooner if needed, with Lab testing done 3-5 days before
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
## 2014-04-06 NOTE — Progress Notes (Signed)
Pre visit review using our clinic review tool, if applicable. No additional management support is needed unless otherwise documented below in the visit note. 

## 2014-04-08 ENCOUNTER — Other Ambulatory Visit: Payer: Self-pay | Admitting: Internal Medicine

## 2014-04-08 NOTE — Telephone Encounter (Signed)
Pt just given hardcopy rx at [facility] jan 13

## 2014-04-10 DIAGNOSIS — E785 Hyperlipidemia, unspecified: Secondary | ICD-10-CM | POA: Insufficient documentation

## 2014-04-10 NOTE — Assessment & Plan Note (Signed)

## 2014-04-10 NOTE — Assessment & Plan Note (Signed)
Mild elevated, for lipitor 10 qd, lower chol diet Lab Results  Component Value Date   LDLCALC 99 03/30/2012

## 2014-04-12 DIAGNOSIS — M5441 Lumbago with sciatica, right side: Secondary | ICD-10-CM | POA: Diagnosis not present

## 2014-04-20 ENCOUNTER — Other Ambulatory Visit: Payer: Self-pay | Admitting: Orthopedic Surgery

## 2014-04-20 DIAGNOSIS — M5441 Lumbago with sciatica, right side: Secondary | ICD-10-CM

## 2014-04-22 ENCOUNTER — Ambulatory Visit
Admission: RE | Admit: 2014-04-22 | Discharge: 2014-04-22 | Disposition: A | Discharge status: Home / Self Care | Payer: Commercial Managed Care - HMO | Source: Ambulatory Visit | Attending: Orthopedic Surgery | Admitting: Orthopedic Surgery

## 2014-04-22 DIAGNOSIS — M5441 Lumbago with sciatica, right side: Secondary | ICD-10-CM

## 2014-04-22 DIAGNOSIS — M4316 Spondylolisthesis, lumbar region: Secondary | ICD-10-CM | POA: Diagnosis not present

## 2014-04-22 DIAGNOSIS — M5136 Other intervertebral disc degeneration, lumbar region: Secondary | ICD-10-CM | POA: Diagnosis not present

## 2014-04-22 DIAGNOSIS — M47817 Spondylosis without myelopathy or radiculopathy, lumbosacral region: Secondary | ICD-10-CM

## 2014-04-22 DIAGNOSIS — M5126 Other intervertebral disc displacement, lumbar region: Secondary | ICD-10-CM | POA: Diagnosis not present

## 2014-04-22 MED ORDER — IOHEXOL 300 MG/ML  SOLN
10.0000 mL | Freq: Once | INTRAMUSCULAR | Status: AC | PRN
  Administered 2014-04-22: 10 mL via INTRATHECAL

## 2014-04-22 MED ORDER — ONDANSETRON HCL 4 MG/2ML IJ SOLN: 4.0000 mg | Freq: Four times a day (QID) | INTRAMUSCULAR | Status: DC | PRN

## 2014-04-22 MED ORDER — DIAZEPAM 5 MG PO TABS
10.0000 mg | ORAL_TABLET | Freq: Once | ORAL | Status: AC
  Administered 2014-04-22: 10 mg via ORAL

## 2014-04-22 NOTE — Progress Notes (Signed)
Pt states he has been off Amitriptyline for the past 2 days.

## 2014-04-22 NOTE — Discharge Instructions (Addendum)
Myelogram Discharge Instructions  1. Go home and rest quietly for the next 24 hours.  It is important to lie flat for the next 24 hours.  Get up only to go to the restroom.  You may lie in the bed or on a couch on your back, your stomach, your left side or your right side.  You may have one pillow under your head.  You may have pillows between your knees while you are on your side or under your knees while you are on your back.  2. DO NOT drive today.  Recline the seat as far back as it will go, while still wearing your seat belt, on the way home.  3. You may get up to go to the bathroom as needed.  You may sit up for 10 minutes to eat.  You may resume your normal diet and medications unless otherwise indicated.  Drink lots of extra fluids today and tomorrow.  4. The incidence of headache, nausea, or vomiting is about 5% (one in 20 patients).  If you develop a headache, lie flat and drink plenty of fluids until the headache goes away.  Caffeinated beverages may be helpful.  If you develop severe nausea and vomiting or a headache that does not go away with flat bed rest, call [phone].  5. You may resume normal activities after your 24 hours of bed rest is over; however, do not exert yourself strongly or do any heavy lifting tomorrow. If when you get up you have a headache when standing, go back to bed and force fluids for another 24 hours.  6. Call your physician for a follow-up appointment.  The results of your myelogram will be sent directly to your physician by the following day.  7. If you have any questions or if complications develop after you arrive home, please call [phone].  Discharge instructions have been explained to the patient.  The patient, or the person responsible for the patient, fully understands these instructions.      May resume Amitriptyline on Jan. 30, 2016, after 8:00 am.

## 2014-04-27 DIAGNOSIS — M4316 Spondylolisthesis, lumbar region: Secondary | ICD-10-CM | POA: Diagnosis not present

## 2014-04-27 DIAGNOSIS — M5441 Lumbago with sciatica, right side: Secondary | ICD-10-CM | POA: Diagnosis not present

## 2014-05-19 DIAGNOSIS — M5416 Radiculopathy, lumbar region: Secondary | ICD-10-CM | POA: Diagnosis not present

## 2014-05-24 DIAGNOSIS — M4316 Spondylolisthesis, lumbar region: Secondary | ICD-10-CM | POA: Diagnosis not present

## 2014-05-24 DIAGNOSIS — M5441 Lumbago with sciatica, right side: Secondary | ICD-10-CM | POA: Diagnosis not present

## 2014-06-18 ENCOUNTER — Emergency Department
Admission: EM | Admit: 2014-06-18 | Discharge: 2014-06-18 | Disposition: A | Discharge status: Home / Self Care | Payer: Commercial Managed Care - HMO | Source: Home / Self Care | Attending: Family Medicine | Admitting: Family Medicine

## 2014-06-18 ENCOUNTER — Encounter: Payer: Self-pay | Admitting: *Deleted

## 2014-06-18 DIAGNOSIS — R21 Rash and other nonspecific skin eruption: Secondary | ICD-10-CM

## 2014-06-18 LAB — POCT CBC W AUTO DIFF (K'VILLE URGENT CARE)

## 2014-06-18 MED ORDER — TRIAMCINOLONE ACETONIDE 40 MG/ML IJ SUSP
40.0000 mg | Freq: Once | INTRAMUSCULAR | Status: AC
  Administered 2014-06-18: 40 mg via INTRAMUSCULAR

## 2014-06-18 NOTE — ED Notes (Signed)
Pt developed a rash on both arms and L ankle 3 days ago.  It is a red, itchy, spotted, and dry rash.  Has tried benadryl cream with little relief and it has not gone away.

## 2014-06-18 NOTE — ED Provider Notes (Signed)
CSN: [id number]     Arrival date & time 06/18/14  1200 History   First MD Initiated Contact with Patient 06/18/14 1243     Chief Complaint  Patient presents with  . Rash     HPI Comments: Patient developed a pruritic rash on both arms and left ankle three days ago.  He has had minimal relief from Benadryl cream.  He feels well otherwise.  He has had no known contact with allergens.  No medication changes.  No fevers, chills, and sweats   Patient is a 59 y.o. male presenting with rash. The history is provided by the patient.  Rash Location: arms and left ankle. Quality: dryness, itchiness and redness   Quality: not blistering, not burning, not draining, not painful, not peeling, not scaling, not swelling and not weeping   Severity:  Mild Onset quality:  Sudden Duration:  3 days Timing:  Constant Progression:  Worsening Chronicity:  New Context: not animal contact, not chemical exposure, not exposure to similar rash, not food, not hot tub use, not insect bite/sting, not medications, not new detergent/soap, not nuts, not plant contact, not pollen, not sick contacts and not sun exposure   Relieved by:  Nothing Worsened by:  Nothing tried Ineffective treatments:  Anti-itch cream Associated symptoms: no diarrhea, no fatigue, no fever, no headaches, no joint pain, no myalgias, no nausea, no sore throat and no URI     Past Medical History  Diagnosis Date  . Hypertension   . Seizures   . Personal history of venous thrombosis and embolism 02/01/2007  . RASH-NONVESICULAR 05/12/2007  . INSOMNIA-SLEEP DISORDER-UNSPEC 03/07/2009  . SEIZURE DISORDER 09/29/2006  . FIBROMYALGIA 02/01/2007  . ROTATOR CUFF SYNDROME, RIGHT 02/01/2007  . BACK PAIN 05/12/2007  . NECK PAIN, LEFT 03/07/2009  . DISC DISEASE, CERVICAL 02/01/2007  . SPONDYLOSIS, LUMBAR 02/01/2007  . PEPTIC ULCER DISEASE 02/01/2007  . GERD 09/29/2006  . BRONCHITIS, ACUTE 02/27/2009  . HYPERTENSION 09/29/2006  . PAIN, CHRONIC NEC 09/29/2006  . SLEEP  APNEA, OBSTRUCTIVE, MODERATE 09/29/2006  . DEPRESSION 09/29/2006  . ANXIETY 09/29/2006  . PROTEIN C DEFICIENCY 09/29/2006  . ANEMIA, PROTEIN-DEFICIENCY 09/29/2006  . Impaired glucose tolerance 04/02/2011   Past Surgical History  Procedure Laterality Date  . Appendectomy    . Electrical stimulator in back    . Knee arthroscopy      right   Family History  Problem Relation Age of Onset  . Hypertension Other   . Colon cancer Neg Hx    History  Substance Use Topics  . Smoking status: Never Smoker   . Smokeless tobacco: Never Used  . Alcohol Use: No    Review of Systems  Constitutional: Negative for fever and fatigue.  HENT: Negative for sore throat.   Gastrointestinal: Negative for nausea and diarrhea.  Musculoskeletal: Negative for myalgias and arthralgias.  Skin: Positive for rash.  Neurological: Negative for headaches.  All other systems reviewed and are negative.   Allergies  Topical sulfur and Topamax  Home Medications   Prior to Admission medications   Medication Sig Start Date End Date Taking? Authorizing Provider  amitriptyline (ELAVIL) 100 MG tablet TAKE 1 TABLET (100 MG TOTAL) BY MOUTH AT BEDTIME. 04/06/14   Corwin Levins, MD  amLODipine (NORVASC) 5 MG tablet Take 1 tablet (5 mg total) by mouth daily. 04/06/14   Corwin Levins, MD  atorvastatin (LIPITOR) 10 MG tablet Take 1 tablet (10 mg total) by mouth daily. 04/06/14   Corwin Levins, MD  clonazePAM (KLONOPIN) 2 MG tablet Take 1 tablet (2 mg total) by mouth at bedtime as needed. 04/06/14   Corwin LevinsJames W John, MD  HYDROmorphone (DILAUDID) 4 MG tablet Take 4 mg by mouth 3 (three) times daily as needed. For pain 06/11/14   Historical Provider, MD  lamoTRIgine (LAMICTAL) 200 MG tablet Take 1 tablet (200 mg total) by mouth daily. 04/06/14   Corwin LevinsJames W John, MD  omeprazole (PRILOSEC) 40 MG capsule Take 1 capsule (40 mg total) by mouth daily. 04/06/14 04/06/15  Corwin LevinsJames W John, MD   BP 120/79 mmHg  Pulse 89  Temp(Src) 97.3 F (36.3 C) (Oral)  Ht  5\' 7"  (1.702 m)  Wt 184 lb (83.462 kg)  BMI 28.81 kg/m2  SpO2 96% Physical Exam  Constitutional: He is oriented to person, place, and time. He appears well-developed and well-nourished. No distress.  HENT:  Head: Normocephalic.  Nose: Nose normal.  Mouth/Throat: Oropharynx is clear and moist.  Eyes: Conjunctivae are normal. Pupils are equal, round, and reactive to light.  Neck: Neck supple.  Cardiovascular: Normal heart sounds.   Pulmonary/Chest: Breath sounds normal.  Abdominal: There is no tenderness.  Lymphadenopathy:    He has no cervical adenopathy.  Neurological: He is alert and oriented to person, place, and time.  Skin: Skin is warm and dry. Rash noted. Rash is macular.     Non-specific appearing macular patches of erythema on arms and left lower leg as noted on diagram.    Nursing note and vitals reviewed.   ED Course  Procedures  none  Labs Reviewed  POCT CBC W AUTO DIFF ([facility]):  WBC 5.5; LY 33.4; MO 5.1; GR 61.5; Hgb 15.4; Platelets 186      MDM   1. Rash and nonspecific skin eruption    Kenalog 40mg  IM May continue Benadryl cream as needed for itching.  May take a non-sedating antihistamine such as Zyrtec or Allegra daytime for itching. Followup with dermatologist if not improved one week.    Lattie HawStephen A Dewon Mendizabal, MD 06/22/14 [phone]

## 2014-06-18 NOTE — Discharge Instructions (Signed)
May continue Benadryl cream as needed for itching.  May take a non-sedating antihistamine such as Zyrtec or Allegra daytime for itching.   Rash A rash is a change in the color or texture of your skin. There are many different types of rashes. You may have other problems that accompany your rash. CAUSES   Infections.  Allergic reactions. This can include allergies to pets or foods.  Certain medicines.  Exposure to certain chemicals, soaps, or cosmetics.  Heat.  Exposure to poisonous plants.  Tumors, both cancerous and noncancerous. SYMPTOMS   Redness.  Scaly skin.  Itchy skin.  Dry or cracked skin.  Bumps.  Blisters.  Pain. DIAGNOSIS  Your caregiver may do a physical exam to determine what type of rash you have. A skin sample (biopsy) may be taken and examined under a microscope. TREATMENT  Treatment depends on the type of rash you have. Your caregiver may prescribe certain medicines. For serious conditions, you may need to see a skin doctor (dermatologist). HOME CARE INSTRUCTIONS   Avoid the substance that caused your rash.  Do not scratch your rash. This can cause infection.  You may take cool baths to help stop itching.  Only take over-the-counter or prescription medicines as directed by your caregiver.  Keep all follow-up appointments as directed by your caregiver. SEEK IMMEDIATE MEDICAL CARE IF:  You have increasing pain, swelling, or redness.  You have a fever.  You have new or severe symptoms.  You have body aches, diarrhea, or vomiting.  Your rash is not better after 3 days. MAKE SURE YOU:  Understand these instructions.  Will watch your condition.  Will get help right away if you are not doing well or get worse. Document Released: 03/01/2002 Document Revised: 06/03/2011 Document Reviewed: 12/24/2010 [facility] Patient Information 2015 [employer] [place]. This information is not intended to replace advice given to you by your health care  provider. Make sure you discuss any questions you have with your health care provider.

## 2014-06-20 NOTE — Pre-Procedure Instructions (Signed)
Joyce CopaDarrell E Sykora  06/20/2014   Your procedure is scheduled on: Wednesday, June 29, 2014  [facility]30 AM.  Call this number if you have problems the morning of surgery: [phone]   Remember: Bring brace in with you on day of procedure.   Do not eat food or drink liquids after midnight Tuesday, June 28, 2014   Take these medicines the morning of surgery with A SIP OF WATER: amLODipine (NORVASC),  lamoTRIgine (LAMICTAL), omeprazole (PRILOSEC), if needed:HYDROmorphone (DILAUDID) for pain   STOP/ Do not take Aspirin, Aleve, Naproxen, Advil, Ibuprofen, Motrin, Vitamins, Herbs, or Supplements starting March 30   Do not wear jewelry, make-up or nail polish.  Do not wear lotions, powders, or perfumes. You may not wear deodorant.  Do not shave 48 hours prior to surgery. Men may shave face and neck.  Do not bring valuables to the hospital.  [facility] for any belongings or valuables.               Contacts, dentures or bridgework may not be worn into surgery.  Leave suitcase in the car. After surgery it may be brought to your room.  For patients admitted to the hospital, discharge time is determined by your treatment team.               Special Instructions: [place] - Preparing for Surgery  Before surgery, you can play an important role.  Because skin is not sterile, your skin needs to be as free of germs as possible.  You can reduce the number of germs on you skin by washing with CHG (chlorahexidine gluconate) soap before surgery.  CHG is an antiseptic cleaner which kills germs and bonds with the skin to continue killing germs even after washing.  Please DO NOT use if you have an allergy to CHG or antibacterial soaps.  If your skin becomes reddened/irritated stop using the CHG and inform your nurse when you arrive at Short Stay.  Do not shave (including legs and underarms) for at least 48 hours prior to the first CHG shower.  You  may shave your face.  Please follow these instructions carefully:   1.  Shower with CHG Soap the night before surgery and the morning of Surgery.  2.  If you choose to wash your hair, wash your hair first as usual with your normal shampoo.  3.  After you shampoo, rinse your hair and body thoroughly to remove the Shampoo.  4.  Use CHG as you would any other liquid soap.  You can apply chg directly  to the skin and wash gently with scrungie or a clean washcloth.  5.  Apply the CHG Soap to your body ONLY FROM THE NECK DOWN.  Do not use on open wounds or open sores.  Avoid contact with your eyes, ears, mouth and genitals (private parts).  Wash genitals (private parts) with your normal soap.  6.  Wash thoroughly, paying special attention to the area where your surgery will be performed.  7.  Thoroughly rinse your body with warm water from the neck down.  8.  DO NOT shower/wash with your normal soap after using and rinsing off the CHG Soap.  9.  Pat yourself dry with a clean towel.            10.  Wear clean pajamas.            11.  Place clean sheets  on your bed the night of your first shower and do not sleep with pets.  Day of Surgery  Do not apply any lotions/deodorants the morning of surgery.  Please wear clean clothes to the hospital/surgery center.   Please read over the following fact sheets that you were given: Pain Booklet, Coughing and Deep Breathing, Blood Transfusion Information, MRSA Information and Surgical Site Infection Prevention

## 2014-06-21 ENCOUNTER — Encounter (HOSPITAL_COMMUNITY)
Admission: RE | Admit: 2014-06-21 | Discharge: 2014-06-21 | Disposition: A | Discharge status: Home / Self Care | Payer: Commercial Managed Care - HMO | Source: Ambulatory Visit | Attending: Orthopedic Surgery | Admitting: Orthopedic Surgery

## 2014-06-21 ENCOUNTER — Encounter (HOSPITAL_COMMUNITY): Payer: Self-pay

## 2014-06-21 DIAGNOSIS — Z01812 Encounter for preprocedural laboratory examination: Secondary | ICD-10-CM | POA: Diagnosis not present

## 2014-06-21 DIAGNOSIS — M5441 Lumbago with sciatica, right side: Secondary | ICD-10-CM | POA: Diagnosis not present

## 2014-06-21 DIAGNOSIS — M79606 Pain in leg, unspecified: Secondary | ICD-10-CM | POA: Insufficient documentation

## 2014-06-21 HISTORY — DX: Age-related osteoporosis without current pathological fracture: M81.0

## 2014-06-21 LAB — CBC
HCT: 43.5 % (ref 39.0–52.0)
HEMOGLOBIN: 15.2 g/dL (ref 13.0–17.0)
MCH: 30.5 pg (ref 26.0–34.0)
MCHC: 34.9 g/dL (ref 30.0–36.0)
MCV: 87.2 fL (ref 78.0–100.0)
PLATELETS: 159 10*3/uL (ref 150–400)
RBC: 4.99 MIL/uL (ref 4.22–5.81)
RDW: 13.5 % (ref 11.5–15.5)
WBC: 6.7 10*3/uL (ref 4.0–10.5)

## 2014-06-21 LAB — TYPE AND SCREEN
ABO/RH(D): A POS
Antibody Screen: NEGATIVE

## 2014-06-21 LAB — BASIC METABOLIC PANEL
Anion gap: 5 (ref 5–15)
BUN: 23 mg/dL (ref 6–23)
CHLORIDE: 105 mmol/L (ref 96–112)
CO2: 29 mmol/L (ref 19–32)
Calcium: 9.4 mg/dL (ref 8.4–10.5)
Creatinine, Ser: 1.28 mg/dL (ref 0.50–1.35)
GFR calc non Af Amer: 61 mL/min — ABNORMAL LOW (ref >90)
GFR, EST AFRICAN AMERICAN: 70 mL/min — AB (ref >90)
Glucose, Bld: 92 mg/dL (ref 70–99)
Potassium: 3.9 mmol/L (ref 3.5–5.1)
Sodium: 139 mmol/L (ref 135–145)

## 2014-06-21 LAB — SURGICAL PCR SCREEN
MRSA, PCR: NEGATIVE
Staphylococcus aureus: POSITIVE — AB
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
## 2014-06-21 NOTE — Pre-Procedure Instructions (Signed)
Joyce CopaDarrell E Hesketh  06/21/2014   Your procedure is scheduled on:  Wednesday, April 6.  [facility].   Call this number if you have problems the morning of surgery: [phone]                     For any other questions, please call [phone], Monday - Friday 8 AM - 4 PM.   Remember:   Do not eat food or drink liquids after midnight Tuesday, April 5.   Take these medicines the morning of surgery with A SIP OF WATER: amLODipine (NORVASC), lamoTRIgine (LAMICTAL),  omeprazole (PRILOSEC).              Take if needed:HYDROmorphone (DILAUDID).   Do not wear jewelry, make-up or nail polish.  Do not wear lotions, powders, or perfumes.               Men may shave face and neck.  Do not bring valuables to the hospital.             [facility] not responsible for any belongings or valuables.               Contacts, dentures or bridgework may not be worn into surgery.  Leave suitcase in the car. After surgery it may be brought to your room.  For patients admitted to the hospital, discharge time is determined by your treatment team.               Patients discharged the day of surgery will not be allowed to drive home.  Name and phone number of your driver: -   Special Instructions: Review  [place] - Preparing For Surgery.   Please read over the following fact sheets that you were given: Pain Booklet, Coughing and Deep Breathing, Blood Transfusion Information and Surgical Site Infection Prevention

## 2014-06-23 ENCOUNTER — Telehealth: Payer: Self-pay | Admitting: *Deleted

## 2014-06-28 MED ORDER — CEFAZOLIN SODIUM-DEXTROSE 2-3 GM-% IV SOLR
2.0000 g | INTRAVENOUS | Status: AC
  Administered 2014-06-29: 2 g via INTRAVENOUS
  Filled 2014-06-28: qty 50

## 2014-06-29 ENCOUNTER — Inpatient Hospital Stay (HOSPITAL_COMMUNITY)
Admission: RE | Admit: 2014-06-29 | Discharge: 2014-07-02 | DRG: 460 | Disposition: A | Discharge status: Home Health | Payer: Commercial Managed Care - HMO | Source: Ambulatory Visit | Attending: Orthopedic Surgery | Admitting: Orthopedic Surgery

## 2014-06-29 ENCOUNTER — Encounter (HOSPITAL_COMMUNITY): Payer: Self-pay | Admitting: *Deleted

## 2014-06-29 ENCOUNTER — Inpatient Hospital Stay (HOSPITAL_COMMUNITY): Payer: Commercial Managed Care - HMO | Admitting: Anesthesiology

## 2014-06-29 ENCOUNTER — Encounter (HOSPITAL_COMMUNITY)
Admission: RE | Disposition: A | Discharge status: Home Health | Payer: Self-pay | Source: Ambulatory Visit | Attending: Orthopedic Surgery

## 2014-06-29 ENCOUNTER — Inpatient Hospital Stay (HOSPITAL_COMMUNITY): Payer: Commercial Managed Care - HMO

## 2014-06-29 DIAGNOSIS — Z981 Arthrodesis status: Secondary | ICD-10-CM | POA: Diagnosis not present

## 2014-06-29 DIAGNOSIS — Z8711 Personal history of peptic ulcer disease: Secondary | ICD-10-CM | POA: Diagnosis not present

## 2014-06-29 DIAGNOSIS — K219 Gastro-esophageal reflux disease without esophagitis: Secondary | ICD-10-CM | POA: Diagnosis present

## 2014-06-29 DIAGNOSIS — M5416 Radiculopathy, lumbar region: Secondary | ICD-10-CM | POA: Diagnosis not present

## 2014-06-29 DIAGNOSIS — M797 Fibromyalgia: Secondary | ICD-10-CM | POA: Diagnosis not present

## 2014-06-29 DIAGNOSIS — Y831 Surgical operation with implant of artificial internal device as the cause of abnormal reaction of the patient, or of later complication, without mention of misadventure at the time of the procedure: Secondary | ICD-10-CM | POA: Diagnosis present

## 2014-06-29 DIAGNOSIS — Z86718 Personal history of other venous thrombosis and embolism: Secondary | ICD-10-CM

## 2014-06-29 DIAGNOSIS — M4317 Spondylolisthesis, lumbosacral region: Secondary | ICD-10-CM | POA: Diagnosis not present

## 2014-06-29 DIAGNOSIS — M549 Dorsalgia, unspecified: Secondary | ICD-10-CM | POA: Diagnosis present

## 2014-06-29 DIAGNOSIS — I10 Essential (primary) hypertension: Secondary | ICD-10-CM | POA: Diagnosis not present

## 2014-06-29 DIAGNOSIS — Z419 Encounter for procedure for purposes other than remedying health state, unspecified: Secondary | ICD-10-CM

## 2014-06-29 DIAGNOSIS — M4316 Spondylolisthesis, lumbar region: Secondary | ICD-10-CM | POA: Diagnosis not present

## 2014-06-29 DIAGNOSIS — Z9689 Presence of other specified functional implants: Secondary | ICD-10-CM | POA: Diagnosis present

## 2014-06-29 DIAGNOSIS — M810 Age-related osteoporosis without current pathological fracture: Secondary | ICD-10-CM | POA: Diagnosis not present

## 2014-06-29 DIAGNOSIS — Z9889 Other specified postprocedural states: Secondary | ICD-10-CM | POA: Diagnosis not present

## 2014-06-29 DIAGNOSIS — G40909 Epilepsy, unspecified, not intractable, without status epilepticus: Secondary | ICD-10-CM | POA: Diagnosis present

## 2014-06-29 DIAGNOSIS — G4733 Obstructive sleep apnea (adult) (pediatric): Secondary | ICD-10-CM | POA: Diagnosis present

## 2014-06-29 HISTORY — PX: SPINAL CORD STIMULATOR BATTERY EXCHANGE: SHX6202

## 2014-06-29 HISTORY — PX: LUMBAR FUSION: SHX111

## 2014-06-29 SURGERY — POSTERIOR LUMBAR FUSION 1 LEVEL
Anesthesia: General | Site: Back

## 2014-06-29 MED ORDER — ACETAMINOPHEN 10 MG/ML IV SOLN
1000.0000 mg | INTRAVENOUS | Status: AC
  Administered 2014-06-29: 1000 mg via INTRAVENOUS
  Filled 2014-06-29: qty 100

## 2014-06-29 MED ORDER — ACETAMINOPHEN 10 MG/ML IV SOLN
1000.0000 mg | Freq: Four times a day (QID) | INTRAVENOUS | Status: DC
  Administered 2014-06-29 – 2014-06-30 (×3): 1000 mg via INTRAVENOUS
  Filled 2014-06-29 (×6): qty 100

## 2014-06-29 MED ORDER — THROMBIN 20000 UNITS EX KIT
PACK | CUTANEOUS | Status: DC | PRN
  Administered 2014-06-29: 20000 [IU] via TOPICAL

## 2014-06-29 MED ORDER — MIDAZOLAM HCL 5 MG/5ML IJ SOLN
INTRAMUSCULAR | Status: DC | PRN
  Administered 2014-06-29: 2 mg via INTRAVENOUS

## 2014-06-29 MED ORDER — FENTANYL CITRATE 0.05 MG/ML IJ SOLN
INTRAMUSCULAR | Status: DC | PRN
  Administered 2014-06-29: 100 ug via INTRAVENOUS
  Administered 2014-06-29: 50 ug via INTRAVENOUS
  Administered 2014-06-29: 100 ug via INTRAVENOUS
  Administered 2014-06-29: 50 ug via INTRAVENOUS

## 2014-06-29 MED ORDER — BUPIVACAINE-EPINEPHRINE 0.25% -1:200000 IJ SOLN
INTRAMUSCULAR | Status: DC | PRN
  Administered 2014-06-29: 20 mL

## 2014-06-29 MED ORDER — ATORVASTATIN CALCIUM 10 MG PO TABS
10.0000 mg | ORAL_TABLET | Freq: Every day | ORAL | Status: DC
  Administered 2014-06-29 – 2014-07-01 (×3): 10 mg via ORAL
  Filled 2014-06-29 (×3): qty 1

## 2014-06-29 MED ORDER — HYDROMORPHONE HCL 1 MG/ML IJ SOLN
INTRAMUSCULAR | Status: AC
  Filled 2014-06-29: qty 1

## 2014-06-29 MED ORDER — LACTATED RINGERS IV SOLN
INTRAVENOUS | Status: DC | PRN
  Administered 2014-06-29 (×2): via INTRAVENOUS

## 2014-06-29 MED ORDER — SODIUM CHLORIDE 0.9 % IJ SOLN: 3.0000 mL | INTRAMUSCULAR | Status: DC | PRN

## 2014-06-29 MED ORDER — METHOCARBAMOL 1000 MG/10ML IJ SOLN
500.0000 mg | Freq: Four times a day (QID) | INTRAVENOUS | Status: DC | PRN
  Administered 2014-06-29: 500 mg via INTRAVENOUS
  Filled 2014-06-29 (×3): qty 5

## 2014-06-29 MED ORDER — HYDROMORPHONE HCL 1 MG/ML IJ SOLN
0.2500 mg | INTRAMUSCULAR | Status: DC | PRN
  Administered 2014-06-29 (×4): 0.5 mg via INTRAVENOUS

## 2014-06-29 MED ORDER — OXYCODONE HCL 5 MG PO TABS
ORAL_TABLET | ORAL | Status: AC
Start: 2014-06-29 — End: 2014-06-30
  Filled 2014-06-29: qty 1

## 2014-06-29 MED ORDER — SUCCINYLCHOLINE CHLORIDE 20 MG/ML IJ SOLN
INTRAMUSCULAR | Status: DC | PRN
  Administered 2014-06-29: 140 mg via INTRAVENOUS

## 2014-06-29 MED ORDER — MENTHOL 3 MG MT LOZG: 1.0000 | LOZENGE | OROMUCOSAL | Status: DC | PRN

## 2014-06-29 MED ORDER — MINERAL OIL LIGHT 100 % EX OIL
TOPICAL_OIL | CUTANEOUS | Status: AC
  Filled 2014-06-29: qty 25

## 2014-06-29 MED ORDER — FENTANYL CITRATE 0.05 MG/ML IJ SOLN
INTRAMUSCULAR | Status: AC
  Filled 2014-06-29: qty 5

## 2014-06-29 MED ORDER — OXYCODONE HCL 5 MG/5ML PO SOLN: 5.0000 mg | Freq: Once | ORAL | Status: AC | PRN

## 2014-06-29 MED ORDER — CEFAZOLIN SODIUM 1-5 GM-% IV SOLN
1.0000 g | Freq: Three times a day (TID) | INTRAVENOUS | Status: AC
  Administered 2014-06-29 – 2014-06-30 (×2): 1 g via INTRAVENOUS
  Filled 2014-06-29 (×2): qty 50

## 2014-06-29 MED ORDER — LIDOCAINE HCL (CARDIAC) 20 MG/ML IV SOLN
INTRAVENOUS | Status: AC
  Filled 2014-06-29: qty 5

## 2014-06-29 MED ORDER — LIDOCAINE HCL (CARDIAC) 20 MG/ML IV SOLN
INTRAVENOUS | Status: DC | PRN
  Administered 2014-06-29: 50 mg via INTRAVENOUS

## 2014-06-29 MED ORDER — SODIUM CHLORIDE 0.9 % IJ SOLN
3.0000 mL | Freq: Two times a day (BID) | INTRAMUSCULAR | Status: DC
  Administered 2014-06-30: 3 mL via INTRAVENOUS

## 2014-06-29 MED ORDER — ONDANSETRON HCL 4 MG/2ML IJ SOLN
INTRAMUSCULAR | Status: DC | PRN
  Administered 2014-06-29: 4 mg via INTRAVENOUS

## 2014-06-29 MED ORDER — CEFAZOLIN SODIUM-DEXTROSE 2-3 GM-% IV SOLR
INTRAVENOUS | Status: DC | PRN
  Administered 2014-06-29: 2 g via INTRAVENOUS

## 2014-06-29 MED ORDER — 0.9 % SODIUM CHLORIDE (POUR BTL) OPTIME
TOPICAL | Status: DC | PRN
  Administered 2014-06-29 (×2): 1000 mL

## 2014-06-29 MED ORDER — PROPOFOL 10 MG/ML IV BOLUS
INTRAVENOUS | Status: AC
  Filled 2014-06-29: qty 20

## 2014-06-29 MED ORDER — PROPOFOL 10 MG/ML IV EMUL
5.0000 ug/kg/min | INTRAVENOUS | Status: DC
  Filled 2014-06-29: qty 100

## 2014-06-29 MED ORDER — DEXAMETHASONE SODIUM PHOSPHATE 4 MG/ML IJ SOLN
4.0000 mg | Freq: Four times a day (QID) | INTRAMUSCULAR | Status: DC
  Administered 2014-06-29 (×2): 4 mg via INTRAVENOUS
  Filled 2014-06-29 (×2): qty 1

## 2014-06-29 MED ORDER — ONDANSETRON HCL 4 MG/2ML IJ SOLN: 4.0000 mg | Freq: Once | INTRAMUSCULAR | Status: DC | PRN

## 2014-06-29 MED ORDER — LACTATED RINGERS IV SOLN
INTRAVENOUS | Status: DC
  Administered 2014-06-29: 22:00:00 via INTRAVENOUS

## 2014-06-29 MED ORDER — MORPHINE SULFATE 2 MG/ML IJ SOLN: 1.0000 mg | INTRAMUSCULAR | Status: DC | PRN

## 2014-06-29 MED ORDER — SODIUM CHLORIDE 0.9 % IV SOLN
250.0000 mL | INTRAVENOUS | Status: DC
  Administered 2014-06-29: 250 mL via INTRAVENOUS

## 2014-06-29 MED ORDER — PROPOFOL 10 MG/ML IV BOLUS
INTRAVENOUS | Status: DC | PRN
  Administered 2014-06-29: 180 mg via INTRAVENOUS

## 2014-06-29 MED ORDER — SURGIFOAM 100 EX MISC
CUTANEOUS | Status: DC | PRN
  Administered 2014-06-29: 1 via TOPICAL

## 2014-06-29 MED ORDER — PROPOFOL INFUSION 10 MG/ML OPTIME
INTRAVENOUS | Status: DC | PRN
  Administered 2014-06-29: 50 ug/kg/min via INTRAVENOUS

## 2014-06-29 MED ORDER — DEXAMETHASONE SODIUM PHOSPHATE 4 MG/ML IJ SOLN
INTRAMUSCULAR | Status: DC | PRN
  Administered 2014-06-29: 4 mg via INTRAVENOUS

## 2014-06-29 MED ORDER — DEXAMETHASONE 4 MG PO TABS
4.0000 mg | ORAL_TABLET | Freq: Four times a day (QID) | ORAL | Status: DC
  Administered 2014-06-30 (×2): 4 mg via ORAL
  Filled 2014-06-29 (×3): qty 1

## 2014-06-29 MED ORDER — AMLODIPINE BESYLATE 5 MG PO TABS
5.0000 mg | ORAL_TABLET | Freq: Every day | ORAL | Status: DC
Start: 2014-06-30 — End: 2014-07-02
  Administered 2014-06-30 – 2014-07-02 (×3): 5 mg via ORAL
  Filled 2014-06-29 (×3): qty 1

## 2014-06-29 MED ORDER — ONDANSETRON HCL 4 MG/2ML IJ SOLN: 4.0000 mg | INTRAMUSCULAR | Status: DC | PRN

## 2014-06-29 MED ORDER — GLYCOPYRROLATE 0.2 MG/ML IJ SOLN
INTRAMUSCULAR | Status: DC | PRN
  Administered 2014-06-29: 0.2 mg via INTRAVENOUS

## 2014-06-29 MED ORDER — ROCURONIUM BROMIDE 50 MG/5ML IV SOLN
INTRAVENOUS | Status: AC
  Filled 2014-06-29: qty 1

## 2014-06-29 MED ORDER — METHOCARBAMOL 500 MG PO TABS
500.0000 mg | ORAL_TABLET | Freq: Four times a day (QID) | ORAL | Status: DC | PRN
  Administered 2014-06-29 – 2014-07-02 (×8): 500 mg via ORAL
  Filled 2014-06-29 (×10): qty 1

## 2014-06-29 MED ORDER — AMITRIPTYLINE HCL 50 MG PO TABS
100.0000 mg | ORAL_TABLET | Freq: Every day | ORAL | Status: DC
  Administered 2014-06-29 – 2014-07-01 (×3): 100 mg via ORAL
  Filled 2014-06-29 (×3): qty 2

## 2014-06-29 MED ORDER — LAMOTRIGINE 200 MG PO TABS
200.0000 mg | ORAL_TABLET | Freq: Every day | ORAL | Status: DC
  Administered 2014-06-30 – 2014-07-02 (×3): 200 mg via ORAL
  Filled 2014-06-29 (×3): qty 1

## 2014-06-29 MED ORDER — PHENYLEPHRINE HCL 10 MG/ML IJ SOLN
INTRAMUSCULAR | Status: DC | PRN
  Administered 2014-06-29 (×5): 80 ug via INTRAVENOUS

## 2014-06-29 MED ORDER — PHENOL 1.4 % MT LIQD: 1.0000 | OROMUCOSAL | Status: DC | PRN

## 2014-06-29 MED ORDER — OXYCODONE HCL 5 MG PO TABS
5.0000 mg | ORAL_TABLET | Freq: Once | ORAL | Status: AC | PRN
  Administered 2014-06-29: 5 mg via ORAL

## 2014-06-29 MED ORDER — HYDROMORPHONE HCL 2 MG PO TABS
4.0000 mg | ORAL_TABLET | Freq: Four times a day (QID) | ORAL | Status: DC | PRN
  Administered 2014-06-29 – 2014-07-02 (×10): 4 mg via ORAL
  Filled 2014-06-29 (×12): qty 2

## 2014-06-29 MED ORDER — BUPIVACAINE-EPINEPHRINE (PF) 0.25% -1:200000 IJ SOLN
INTRAMUSCULAR | Status: AC
  Filled 2014-06-29: qty 30

## 2014-06-29 MED ORDER — HYDROMORPHONE HCL 1 MG/ML IJ SOLN
0.5000 mg | INTRAMUSCULAR | Status: DC | PRN
  Administered 2014-06-29 – 2014-06-30 (×5): 1 mg via INTRAVENOUS
  Filled 2014-06-29 (×5): qty 1

## 2014-06-29 MED ORDER — MIDAZOLAM HCL 2 MG/2ML IJ SOLN
INTRAMUSCULAR | Status: AC
  Filled 2014-06-29: qty 2

## 2014-06-29 MED ORDER — THROMBIN 20000 UNITS EX SOLR
CUTANEOUS | Status: AC
  Filled 2014-06-29: qty 40000

## 2014-06-29 MED ORDER — CLONAZEPAM 1 MG PO TABS
2.0000 mg | ORAL_TABLET | Freq: Every evening | ORAL | Status: DC | PRN
  Administered 2014-06-29 – 2014-07-01 (×3): 2 mg via ORAL
  Filled 2014-06-29 (×3): qty 2

## 2014-06-29 SURGICAL SUPPLY — 99 items
BLADE SURG ROTATE 9660 (MISCELLANEOUS) ×6 IMPLANT
CAGE TLIF XL 10 SPINE (Cage) ×2 IMPLANT
CAGE TLIF XL 10MM SPINE (Cage) ×1 IMPLANT
CANISTER SUCTION 2500CC (MISCELLANEOUS) ×3 IMPLANT
CLIP NEUROVISION LG (CLIP) ×3 IMPLANT
CLOSURE STERI-STRIP 1/2X4 (GAUZE/BANDAGES/DRESSINGS) ×2
CLOSURE WOUND 1/2 X4 (GAUZE/BANDAGES/DRESSINGS) ×1
CLSR STERI-STRIP ANTIMIC 1/2X4 (GAUZE/BANDAGES/DRESSINGS) ×4 IMPLANT
CORDS BIPOLAR (ELECTRODE) ×3 IMPLANT
COVER BACK TABLE 60X90IN (DRAPES) ×3 IMPLANT
COVER SURGICAL LIGHT HANDLE (MISCELLANEOUS) ×3 IMPLANT
COVER TABLE BACK 60X90 (DRAPES) ×3 IMPLANT
COVER TRANSDUCER ULTRASND GEL (DRAPE) ×3 IMPLANT
DECANTER SPIKE VIAL GLASS SM (MISCELLANEOUS) ×3 IMPLANT
DRAPE C-ARM 42X72 X-RAY (DRAPES) ×3 IMPLANT
DRAPE C-ARMOR (DRAPES) ×3 IMPLANT
DRAPE INCISE IOBAN 66X45 STRL (DRAPES) ×3 IMPLANT
DRAPE LAPAROTOMY T 102X78X121 (DRAPES) ×3 IMPLANT
DRAPE PED LAPAROTOMY (DRAPES) ×3 IMPLANT
DRAPE POUCH INSTRU U-SHP 10X18 (DRAPES) ×3 IMPLANT
DRAPE SURG 17X23 STRL (DRAPES) ×3 IMPLANT
DRAPE TABLE COVER HEAVY DUTY (DRAPES) ×3 IMPLANT
DRAPE U-SHAPE 47X51 STRL (DRAPES) ×6 IMPLANT
DRSG MEPILEX BORDER 4X4 (GAUZE/BANDAGES/DRESSINGS) ×6 IMPLANT
DRSG MEPILEX BORDER 4X8 (GAUZE/BANDAGES/DRESSINGS) ×3 IMPLANT
DURAPREP 26ML APPLICATOR (WOUND CARE) ×3 IMPLANT
ELECT BLADE 4.0 EZ CLEAN MEGAD (MISCELLANEOUS) ×3
ELECT BLADE 6.5 EXT (BLADE) IMPLANT
ELECT PENCIL ROCKER SW 15FT (MISCELLANEOUS) ×3 IMPLANT
ELECT REM PT RETURN 9FT ADLT (ELECTROSURGICAL) ×3
ELECTRODE BLDE 4.0 EZ CLN MEGD (MISCELLANEOUS) ×1 IMPLANT
ELECTRODE REM PT RTRN 9FT ADLT (ELECTROSURGICAL) ×1 IMPLANT
GLOVE BIO SURGEON STRL SZ 6.5 (GLOVE) ×2 IMPLANT
GLOVE BIO SURGEONS STRL SZ 6.5 (GLOVE) ×1
GLOVE BIOGEL PI IND STRL 8 (GLOVE) ×1 IMPLANT
GLOVE BIOGEL PI IND STRL 8.5 (GLOVE) ×1 IMPLANT
GLOVE BIOGEL PI INDICATOR 8 (GLOVE) ×2
GLOVE BIOGEL PI INDICATOR 8.5 (GLOVE) ×2
GLOVE ORTHO TXT STRL SZ7.5 (GLOVE) ×3 IMPLANT
GLOVE SS BIOGEL STRL SZ 8.5 (GLOVE) ×3 IMPLANT
GLOVE SUPERSENSE BIOGEL SZ 8.5 (GLOVE) ×6
GLOVE SURG SS PI 6.0 STRL IVOR (GLOVE) ×9 IMPLANT
GOWN STRL REUS W/ TWL LRG LVL3 (GOWN DISPOSABLE) ×1 IMPLANT
GOWN STRL REUS W/TWL 2XL LVL3 (GOWN DISPOSABLE) ×6 IMPLANT
GOWN STRL REUS W/TWL LRG LVL3 (GOWN DISPOSABLE) ×2
GUIDEWIRE NITINOL BEVEL TIP (WIRE) ×12 IMPLANT
KIT BASIN OR (CUSTOM PROCEDURE TRAY) ×3 IMPLANT
KIT NEEDLE NVM5 EMG ELECT (KITS) ×1 IMPLANT
KIT NEEDLE NVM5 EMG ELECTRODE (KITS) ×2
KIT ROOM TURNOVER OR (KITS) ×3 IMPLANT
LIGHT SOURCE ANGLE TIP STR 7FT (MISCELLANEOUS) ×3 IMPLANT
MAS TLIF HOOP SHIM (KITS) ×3 IMPLANT
NEEDLE 22X1 1/2 (OR ONLY) (NEEDLE) ×3 IMPLANT
NEEDLE I-PASS III (NEEDLE) ×3 IMPLANT
NEEDLE SPNL 18GX3.5 QUINCKE PK (NEEDLE) ×6 IMPLANT
NS IRRIG 1000ML POUR BTL (IV SOLUTION) ×3 IMPLANT
PACK GENERAL/GYN (CUSTOM PROCEDURE TRAY) ×3 IMPLANT
PACK UNIVERSAL I (CUSTOM PROCEDURE TRAY) ×3 IMPLANT
PAD ARMBOARD 7.5X6 YLW CONV (MISCELLANEOUS) ×6 IMPLANT
PATIENT PROGRAMMER PROTEGE MRI (MISCELLANEOUS) ×3 IMPLANT
PATTIES SURGICAL .5 X.5 (GAUZE/BANDAGES/DRESSINGS) ×3 IMPLANT
PATTIES SURGICAL .5 X1 (DISPOSABLE) ×3 IMPLANT
POSITIONER HEAD PRONE TRACH (MISCELLANEOUS) ×3 IMPLANT
PRECEPT SHANK CANN 6.5X40 (Neuro Prosthesis/Implant) ×3 IMPLANT
PRECEPT TULIPS (Neuro Prosthesis/Implant) ×6 IMPLANT
PROBE BALL TIP NVM5 SNG USE (BALLOONS) ×3 IMPLANT
PUTTY DBX 1CC (Putty) ×3 IMPLANT
PUTTY DBX 1CC DEPUY (Putty) ×1 IMPLANT
ROD 45MM (Rod) ×6 IMPLANT
SCREW PRECEPT 6.5X40 (Screw) ×3 IMPLANT
SCREW PRECEPT POLYAXIAL 6.5X35 (Screw) ×3 IMPLANT
SCREW PRECEPT SET (Screw) ×12 IMPLANT
SCREW SHANK PRECEPT 6.5X35 (Screw) ×3 IMPLANT
SHEET CONFORM 45LX20WX5H (Bone Implant) ×3 IMPLANT
SPONGE LAP 4X18 X RAY DECT (DISPOSABLE) ×6 IMPLANT
SPONGE SURGIFOAM ABS GEL 100 (HEMOSTASIS) ×3 IMPLANT
STAPLER VISISTAT 35W (STAPLE) IMPLANT
STIMULATOR IPG MRI PROTEGE PR (Stimulator) ×3 IMPLANT
STRIP CLOSURE SKIN 1/2X4 (GAUZE/BANDAGES/DRESSINGS) ×2 IMPLANT
SURGIFLO TRUKIT (HEMOSTASIS) ×3 IMPLANT
SUT BONE WAX W31G (SUTURE) ×3 IMPLANT
SUT MNCRL AB 3-0 PS2 18 (SUTURE) ×6 IMPLANT
SUT MON AB 3-0 SH 27 (SUTURE) ×2
SUT MON AB 3-0 SH27 (SUTURE) ×1 IMPLANT
SUT VIC AB 1 CT1 18XCR BRD 8 (SUTURE) ×1 IMPLANT
SUT VIC AB 1 CT1 27 (SUTURE) ×2
SUT VIC AB 1 CT1 27XBRD ANBCTR (SUTURE) ×1 IMPLANT
SUT VIC AB 1 CT1 8-18 (SUTURE) ×2
SUT VIC AB 2-0 CT1 18 (SUTURE) ×9 IMPLANT
SUT VICRYL 0 UR6 27IN ABS (SUTURE) ×3 IMPLANT
SYR BULB IRRIGATION 50ML (SYRINGE) ×3 IMPLANT
SYR CONTROL 10ML LL (SYRINGE) ×9 IMPLANT
SYSTEM CHARGING PRODIGY (MISCELLANEOUS) ×3 IMPLANT
TORQUE WRENCH ×3 IMPLANT
TOWEL OR 17X24 6PK STRL BLUE (TOWEL DISPOSABLE) ×6 IMPLANT
TOWEL OR 17X26 10 PK STRL BLUE (TOWEL DISPOSABLE) ×3 IMPLANT
TRAY FOLEY CATH 16FRSI W/METER (SET/KITS/TRAYS/PACK) ×3 IMPLANT
WATER STERILE IRR 1000ML POUR (IV SOLUTION) ×3 IMPLANT
YANKAUER SUCT BULB TIP NO VENT (SUCTIONS) IMPLANT
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
## 2014-06-29 NOTE — Progress Notes (Signed)
Utilization review completed.  
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
## 2014-06-29 NOTE — Transfer of Care (Signed)
Immediate Anesthesia Transfer of Care Note  Patient: Joyce CopaDarrell E Shiroma  Procedure(s) Performed: Procedure(s): TRANSFORAMINAL LUMBAR INTERBODY FUSION (TLIF) WITH PEDICLE SCREW FIXATION L5-S1 (N/A) SPINAL CORD STIMULATOR BATTERY EXCHANGE (N/A)  Patient Location: PACU  Anesthesia Type:General  Level of Consciousness: awake  Airway & Oxygen Therapy: Patient Spontanous Breathing and Patient connected to nasal cannula oxygen  Post-op Assessment: Report given to RN and Post -op Vital signs reviewed and stable  Post vital signs: Reviewed and stable  Last Vitals:  Filed Vitals:   06/29/14 0638  BP: 132/82  Pulse: 79  Temp: 36.7 C  Resp: 18    Complications: No apparent anesthesia complications

## 2014-06-29 NOTE — Progress Notes (Signed)
Report given to robin roberts rn as caregiver 

## 2014-06-29 NOTE — Brief Op Note (Signed)
06/29/2014  12:41 PM  PATIENT:  [name]  59 y.o. 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  POST-OPERATIVE DIAGNOSIS:  L5 PAIN DEFECT WITH RADICULAR LEG PAIN  PROCEDURE:  Procedure(s): TRANSFORAMINAL LUMBAR INTERBODY FUSION (TLIF) WITH PEDICLE SCREW FIXATION L5-S1 (N/A) SPINAL CORD STIMULATOR BATTERY EXCHANGE (N/A)  SURGEON:  Surgeon(s) and Role:    * Venita Lickahari Charvis Lightner, MD - Primary  PHYSICIAN ASSISTANT:   ASSISTANTS: none   ANESTHESIA:   general  EBL:  Total I/O In: 1000 [I.V.:1000] Out: 405 [Urine:155; Blood:250]  BLOOD ADMINISTERED:none  DRAINS: none   LOCAL MEDICATIONS USED:  MARCAINE     SPECIMEN:  No Specimen  DISPOSITION OF SPECIMEN:  N/A  COUNTS:  YES  TOURNIQUET:  * No tourniquets in log *  DICTATION: .Other Dictation: Dictation Number [id number]  PLAN OF CARE: Admit to inpatient   PATIENT DISPOSITION:  PACU - hemodynamically stable.

## 2014-06-29 NOTE — Anesthesia Postprocedure Evaluation (Signed)
  Anesthesia Post-op Note  Patient: [name] E Kasparek  Procedure(s) Performed: Procedure(s): TRANSFORAMINAL LUMBAR INTERBODY FUSION (TLIF) WITH PEDICLE SCREW FIXATION L5-S1 (N/A) SPINAL CORD STIMULATOR BATTERY EXCHANGE (N/A)  Patient Location: PACU  Anesthesia Type:General  Level of Consciousness: awake, alert  and oriented  Airway and Oxygen Therapy: Patient Spontanous Breathing and Patient connected to nasal cannula oxygen  Post-op Pain: mild  Post-op Assessment: Post-op Vital signs reviewed, Patient's Cardiovascular Status Stable, Respiratory Function Stable, Patent Airway and Pain level controlled  Post-op Vital Signs: stable  Last Vitals:  Filed Vitals:   06/29/14 1400  BP: 134/73  Pulse: 91  Temp:   Resp: 17    Complications: No apparent anesthesia complications

## 2014-06-30 ENCOUNTER — Inpatient Hospital Stay (HOSPITAL_COMMUNITY): Payer: Commercial Managed Care - HMO

## 2014-06-30 ENCOUNTER — Encounter (HOSPITAL_COMMUNITY): Payer: Self-pay | Admitting: Orthopedic Surgery

## 2014-06-30 MED ORDER — ACETAMINOPHEN 500 MG PO TABS
1000.0000 mg | ORAL_TABLET | Freq: Once | ORAL | Status: AC
  Administered 2014-06-30: 1000 mg via ORAL
  Filled 2014-06-30: qty 2

## 2014-06-30 NOTE — Evaluation (Signed)
Physical Therapy Evaluation Patient Details Name: [name] MRN: [id number] DOB: [birth year] Today's Date: 06/30/2014   History of Present Illness  59 yo male s/p L5-s1 diskectomy and insertion of intervertbral biomechanial device and pedicle screw fixation PMH: seizures, fibromyalgia, R rotator cuff syndrome, neck pain, depression, anxiety,HTN, R knee arthroscopy  Clinical Impression  Pt is s/p L5-S1 discectomy and insertion of intervertebral biomechanical device/pedicle screw fixation, resulting in the deficits listed below (see PT Problem List). Pt able to demonstrate ambulation, bed mobility and transfers with min/mod level assist.  Precautions were reinforced verbally and with handout to patient and family present.  Recommend return home with home health PT, as pt has strong support system and 24 hour care available from wife who is a Engineer, civil (consulting). Pt will benefit from skilled PT to increase their independence and safety with mobility to allow discharge to the venue listed below.      Follow Up Recommendations Home health PT;Supervision/Assistance - 24 hour    Equipment Recommendations  None recommended by PT    Recommendations for Other Services       Precautions / Restrictions Precautions Precautions: Back Precaution Booklet Issued: Yes (comment) Precaution Comments: Reviewed back precautions with patient with handout Required Braces or Orthoses: Spinal Brace Spinal Brace: Lumbar corset;Applied in sitting position Restrictions Weight Bearing Restrictions: No      Mobility  Bed Mobility Overal bed mobility: Needs Assistance Bed Mobility: Supine to Sit       Sit to supine: Mod assist   General bed mobility comments: Max v/c's for safe transition onto side and then to supine without bending/twisting; will reinforce bed mobility tomorrow  Transfers Overall transfer level: Needs assistance Equipment used: Rolling walker (2 wheeled) Transfers: Sit to/from Stand Sit to  Stand: Min guard         General transfer comment: Max v/c's for safe hand placement  Ambulation/Gait Ambulation/Gait assistance: Min guard Ambulation Distance (Feet): 100 Feet Assistive device: Rolling walker (2 wheeled) Gait Pattern/deviations: Step-to pattern;Decreased stride length;Shuffle Gait velocity: Slow Gait velocity interpretation: Below normal speed for age/gender General Gait Details: Limited by 9/10 sharp pain with ambulation, min v/c's for walker placement, decreased stride length, heavy reliance on B UE on RW for support  Stairs            Wheelchair Mobility    Modified Rankin (Stroke Patients Only)       Balance Overall balance assessment: Needs assistance         Standing balance support: Bilateral upper extremity supported;During functional activity Standing balance-Leahy Scale: Fair Standing balance comment: Able to tighten brace in standing without UE support                              Pertinent Vitals/Pain Pain Assessment: 0-10 Pain Score: 9  Pain Location: surgical site Pain Descriptors / Indicators: Sharp;Shooting (With ambulation) Pain Intervention(s): Monitored during session    Home Living Family/patient expects to be discharged to:: Private residence Living Arrangements: Spouse/significant other Available Help at Discharge: Family Type of Home: House Home Access: Level entry     Home Layout: One level Home Equipment: Environmental consultant - 2 wheels      Prior Function Level of Independence: Independent               Hand Dominance   Dominant Hand: Right    Extremity/Trunk Assessment   Upper Extremity Assessment: Overall WFL for tasks assessed  Lower Extremity Assessment: Overall WFL for tasks assessed      Cervical / Trunk Assessment: Other exceptions (Post surgical back)  Communication   Communication: No difficulties  Cognition Arousal/Alertness: Awake/alert Behavior During Therapy: WFL  for tasks assessed/performed Overall Cognitive Status: Within Functional Limits for tasks assessed                      General Comments      Exercises        Assessment/Plan    PT Assessment Patient needs continued PT services  PT Diagnosis Difficulty walking;Abnormality of gait;Generalized weakness;Acute pain   PT Problem List Decreased strength;Decreased range of motion;Decreased activity tolerance;Decreased balance;Decreased mobility;Decreased knowledge of use of DME;Decreased safety awareness;Decreased knowledge of precautions  PT Treatment Interventions DME instruction;Gait training;Functional mobility training;Therapeutic activities;Therapeutic exercise;Balance training;Patient/family education   PT Goals (Current goals can be found in the Care Plan section) Acute Rehab PT Goals Patient Stated Goal: Return home PT Goal Formulation: With patient/family Time For Goal Achievement: 07/14/14 Potential to Achieve Goals: Good    Frequency Min 5X/week   Barriers to discharge        Co-evaluation               End of Session Equipment Utilized During Treatment: Gait belt;Back brace Activity Tolerance: Patient tolerated treatment well Patient left: in bed;with call bell/phone within reach;with family/visitor present Nurse Communication: Mobility status         Time: 1401-1430 PT Time Calculation (min) (ACUTE ONLY): 29 min   Charges:   PT Evaluation $Initial PT Evaluation Tier I: 1 Procedure PT Treatments $Gait Training: 8-22 mins   PT G Codes:        Carollynn Pennywell 06/30/2014, 2:59 PM  Vella RaringKailee Zipporah Finamore, SPT (student physical therapist) Office phone: [phone]
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
## 2014-06-30 NOTE — Evaluation (Signed)
Occupational Therapy Evaluation Patient Details Name: [name] MRN: [id number] DOB: 02/15/1956 Today's Date: 06/30/2014    History of Present Illness 59 yo male s/p L5-s1 diskectomy and insertion of intervertbral biomechanial device and pedicle screw fixation PMH: seizures, fibromyalgia, R rotator cuff syndrome, neck pain, depression, anxiety,HTN, R knee arthroscopy   Clinical Impression   Patient is s/p L5-s1 diskectomy surgery resulting in functional limitations due to the deficits listed below (see OT problem list). Pt progressing well and needs reinforcement of back precautions with bed mobility. Pt very pleasant and eager to see his yorkie dog.  Patient will benefit from skilled OT acutely to increase independence and safety with ADLS to allow discharge home with shower seat.     Follow Up Recommendations  No OT follow up    Equipment Recommendations  Tub/shower seat    Recommendations for Other Services       Precautions / Restrictions Precautions Precautions: Back Precaution Comments: provided handout and reviewed Required Braces or Orthoses: Spinal Brace Spinal Brace: Lumbar corset;Applied in sitting position      Mobility Bed Mobility Overal bed mobility: Needs Assistance Bed Mobility: Sit to Supine       Sit to supine: Max assist   General bed mobility comments: pt attempting to fall back into the bed and not follow precautions. pt abandoned RW across room and ambulated to bed without RW. Pt advised to back up with RW. Pt needed max cues and hand over hand to complete transfer safely  Transfers Overall transfer level: Needs assistance Equipment used: Rolling walker (2 wheeled) Transfers: Sit to/from Stand Sit to Stand: Min guard         General transfer comment: cues for hand placement and safety    Balance                                            ADL Overall ADL's : Needs assistance/impaired Eating/Feeding:  Independent   Grooming: Wash/dry hands;Wash/dry face;Oral care;Supervision/safety;Standing   Upper Body Bathing: Supervision/ safety;Sitting     Lower Body Bathing Details (indicate cue type and reason): plans to have wife (A) and has reacher at home         Toilet Transfer: Supervision/safety;Ambulation;RW;BSC       Tub/ Shower Transfer: Minimal assistance;Tub bench;Rolling walker Tub/Shower Transfer Details (indicate cue type and reason): completed transfer with wife present. Pt will require a seat at this time.  Functional mobility during ADLs: Min guard;Rolling walker General ADL Comments: pt needs cues for safety and to follow precautions. pt breaking precautions with bed mobility.     Vision     Perception     Praxis      Pertinent Vitals/Pain Pain Assessment: 0-10 Pain Score: 6  Pain Location: back surgicial site Pain Descriptors / Indicators: Constant Pain Intervention(s): Monitored during session;Premedicated before session     Hand Dominance Right   Extremity/Trunk Assessment Upper Extremity Assessment Upper Extremity Assessment: Overall WFL for tasks assessed   Lower Extremity Assessment Lower Extremity Assessment: Defer to PT evaluation       Communication Communication Communication: No difficulties   Cognition Arousal/Alertness: Awake/alert Behavior During Therapy: WFL for tasks assessed/performed Overall Cognitive Status: Within Functional Limits for tasks assessed                     General Comments  Exercises       Shoulder Instructions      Home Living Family/patient expects to be discharged to:: Private residence Living Arrangements: Spouse/significant other Available Help at Discharge: Family Type of Home: House (townhome) Home Access: Level entry     Home Layout: One level     Bathroom Shower/Tub: Tub/shower unit Shower/tub characteristics: Curtain Firefighter: Standard     Home Equipment: Environmental consultant -  2 wheels;Cane - single point;Bedside commode          Prior Functioning/Environment Level of Independence: Independent with assistive device(s)             OT Diagnosis: Generalized weakness;Acute pain   OT Problem List: Decreased strength;Decreased activity tolerance;Impaired balance (sitting and/or standing);Decreased safety awareness;Decreased knowledge of use of DME or AE;Cardiopulmonary status limiting activity;Pain   OT Treatment/Interventions: Self-care/ADL training;Therapeutic exercise;Therapeutic activities;Patient/family education;Balance training;DME and/or AE instruction    OT Goals(Current goals can be found in the care plan section) Acute Rehab OT Goals Patient Stated Goal: to return home to [place] OT Goal Formulation: With patient Time For Goal Achievement: 07/14/14 Potential to Achieve Goals: Good  OT Frequency: Min 2X/week   Barriers to D/C:            Co-evaluation              End of Session Equipment Utilized During Treatment: Gait belt;Rolling walker;Back brace Nurse Communication: Mobility status;Precautions  Activity Tolerance: Patient tolerated treatment well Patient left: in bed (going to xray)   Time: 9604-5409 OT Time Calculation (min): 24 min Charges:  OT General Charges $OT Visit: 1 Procedure OT Evaluation $Initial OT Evaluation Tier I: 1 Procedure OT Treatments $Self Care/Home Management : 8-22 mins G-Codes:    Boone Master B 2014-07-27, 10:43 AM  Pager: [phone]

## 2014-06-30 NOTE — Progress Notes (Signed)
    Subjective: Procedure(s) (LRB): TRANSFORAMINAL LUMBAR INTERBODY FUSION (TLIF) WITH PEDICLE SCREW FIXATION L5-S1 (N/A) SPINAL CORD STIMULATOR BATTERY EXCHANGE (N/A) 1 Day Post-Op  Patient reports pain as 3 on 0-10 scale.  Reports decreased leg pain reports incisional back pain   Positive void Negative bowel movement Positive flatus Negative chest pain or shortness of breath  Objective: Vital signs in last 24 hours: Temp:  [98 F (36.7 C)-98.6 F (37 C)] 98.2 F (36.8 C) (04/07 0635) Pulse Rate:  [88-102] 99 (04/07 0635) Resp:  [13-18] 16 (04/06 2110) BP: (117-137)/(69-81) 117/73 mmHg (04/07 0635) SpO2:  [94 %-100 %] 95 % (04/07 0635)  Intake/Output from previous day: 04/06 0701 - 04/07 0700 In: 3110 [P.O.:240; I.V.:2820; IV Piggyback:50] Out: 4055 [Urine:3805; Blood:250]  Labs: No results for input(s): WBC, RBC, HCT, PLT in the last 72 hours. No results for input(s): NA, K, CL, CO2, BUN, CREATININE, GLUCOSE, CALCIUM in the last 72 hours. No results for input(s): LABPT, INR in the last 72 hours.  Physical Exam: Neurologically intact ABD soft Neurovascular intact Intact pulses distally Incision: dressing C/D/I Compartment soft  Assessment/Plan: Patient stable  xrays pending Continue mobilization with physical therapy Continue care  Advance diet Up with therapy D/C IV fluids Plan for discharge tomorrow Discharge home with home health  Possible d/c Friday or saturday  Venita Lickahari Camdan Burdi, MD [facility] [phone]

## 2014-07-01 MED ORDER — MAGNESIUM CITRATE PO SOLN
1.0000 | Freq: Once | ORAL | Status: AC
  Administered 2014-07-01: 1 via ORAL
  Filled 2014-07-01: qty 296

## 2014-07-01 NOTE — Progress Notes (Signed)
    Subjective: Procedure(s) (LRB): TRANSFORAMINAL LUMBAR INTERBODY FUSION (TLIF) WITH PEDICLE SCREW FIXATION L5-S1 (N/A) SPINAL CORD STIMULATOR BATTERY EXCHANGE (N/A) 2 Days Post-Op  Patient reports pain as 4 on 0-10 scale.  Reports decreased leg pain reports incisional back pain   Positive void Negative bowel movement Positive flatus Negative chest pain or shortness of breath  Objective: Vital signs in last 24 hours: Temp:  [97.7 F (36.5 C)-98.4 F (36.9 C)] 97.7 F (36.5 C) (04/08 0510) Pulse Rate:  [94-103] 94 (04/08 0510) Resp:  [18] 18 (04/08 0510) BP: (120-128)/(70-79) 128/77 mmHg (04/08 0510) SpO2:  [96 %-99 %] 98 % (04/08 0510)  Intake/Output from previous day: 04/07 0701 - 04/08 0700 In: 720 [P.O.:720] Out: -   Labs: No results for input(s): WBC, RBC, HCT, PLT in the last 72 hours. No results for input(s): NA, K, CL, CO2, BUN, CREATININE, GLUCOSE, CALCIUM in the last 72 hours. No results for input(s): LABPT, INR in the last 72 hours.  Physical Exam: Neurologically intact ABD soft Neurovascular intact Intact pulses distally Incision: dressing C/D/I and no drainage Compartment soft  Assessment/Plan: Patient stable  xrays satisfactory Continue mobilization with physical therapy Continue care  Advance diet Up with therapy Plan for discharge tomorrow Discharge home with home health  Venita Lickahari Sinai Illingworth, MD [facility] [phone]

## 2014-07-01 NOTE — Progress Notes (Signed)
Occupational Therapy Treatment Patient Details Name: [name] MRN: [id number] DOB: 14-Sep-1955 Today's Date: 07/01/2014    History of present illness 59 yo male s/p L5-s1 diskectomy and insertion of intervertbral biomechanial device and pedicle screw fixation PMH: seizures, fibromyalgia, R rotator cuff syndrome, neck pain, depression, anxiety,HTN, R knee arthroscopy   OT comments  Pt with much improved supine<>sit bed mobility this session. Pt is at adequate level from OT standpoint for d/ chom with wife (A). Pt can don doff brace independently.    Follow Up Recommendations  No OT follow up    Equipment Recommendations  Tub/shower seat    Recommendations for Other Services      Precautions / Restrictions Precautions Precautions: Back Precaution Comments: reviewed precautions Required Braces or Orthoses: Spinal Brace Spinal Brace: Lumbar corset;Applied in sitting position       Mobility Bed Mobility Overal bed mobility: Modified Independent             General bed mobility comments: pt progessed to eob correctly without bed rails and hob < 20 degrees  Transfers Overall transfer level: Needs assistance Equipment used: Rolling walker (2 wheeled) Transfers: Sit to/from Stand Sit to Stand: Supervision         General transfer comment: good hand placement this session    Balance Overall balance assessment: Needs assistance         Standing balance support: No upper extremity supported;During functional activity Standing balance-Leahy Scale: Good                     ADL       Grooming: Wash/dry hands;Wash/dry face;Oral care;Modified independent                   Toilet Transfer: Modified Independent;Ambulation           Functional mobility during ADLs: Supervision/safety;Rolling walker General ADL Comments: pt completed bed moblity, sink level adl and toilet transfer. Pt will have wife (A) for LB dressing / bathing. (don back  brace Independent)      Vision                     Perception     Praxis      Cognition   Behavior During Therapy: WFL for tasks assessed/performed Overall Cognitive Status: Within Functional Limits for tasks assessed                       Extremity/Trunk Assessment               Exercises     Shoulder Instructions       General Comments      Pertinent Vitals/ Pain       Pain Assessment: 0-10 Pain Score: 6  Pain Location: surgicial site Pain Descriptors / Indicators: Dull Pain Intervention(s): Monitored during session;Premedicated before session  Home Living                                          Prior Functioning/Environment              Frequency Min 2X/week     Progress Toward Goals  OT Goals(current goals can now be found in the care plan section)  Progress towards OT goals: Progressing toward goals  Acute Rehab OT Goals Patient Stated Goal: Return home OT Goal Formulation: With  patient Time For Goal Achievement: 07/14/14 Potential to Achieve Goals: Good ADL Goals Pt Will Perform Tub/Shower Transfer: Tub transfer;with supervision;shower seat;ambulating;rolling walker Additional ADL Goal #1: Pt will complete bed moblity in and out supervision level with wife (A)   Plan Discharge plan remains appropriate    Co-evaluation                 End of Session Equipment Utilized During Treatment: Gait belt;Rolling walker;Back brace   Activity Tolerance Patient tolerated treatment well   Patient Left in chair;with call bell/phone within reach;with chair alarm set   Nurse Communication Mobility status;Precautions        Time: [id number] OT Time Calculation (min): 16 min  Charges: OT General Charges $OT Visit: 1 Procedure OT Treatments $Self Care/Home Management : 8-22 mins  Boone MasterJones, Shahid Flori B 07/01/2014, 9:40 AM  Pager: [phone] r

## 2014-07-01 NOTE — Care Management Note (Signed)
CARE MANAGEMENT NOTE 07/01/2014  Patient:  Joyce CopaSTEPHENS,Daud E   Account Number:  [id number]  Date Initiated:  07/01/2014  Documentation initiated by:  Vance PeperBRADY,Jaden Batchelder  Subjective/Objective Assessment:   59 yr old male admitted with L5 pain, radiculopathy. Patient underwent a TLIFC L5-S1.     Action/Plan:   Case manager spoke with patient and wife concerning home health needs. Choice offered. [facility] [place] Patient states he has RW and 3in1. Has support at discharge.   Anticipated DC Date:  07/02/2014   Anticipated DC Plan:  HOME W HOME HEALTH SERVICES      DC Planning Services  CM consult      Choice offered to / List presented to:  C-1 Patient   DME arranged  NA        HH arranged  HH-2 PT      HH agency  [facility]   Status of service:  Completed, signed off Medicare Important Message given?   (If response is "NO", the following Medicare IM given date fields will be blank) Date Medicare IM given:   Medicare IM given by:   Date Additional Medicare IM given:   Additional Medicare IM given by:    Discharge Disposition:  HOME W HOME HEALTH SERVICES  Per UR Regulation:  Reviewed for med. necessity/level of care/duration of stay

## 2014-07-01 NOTE — Progress Notes (Signed)
Physical Therapy Treatment Patient Details Name: [name] MRN: [id number] DOB: 10/13/1955 Today's Date: 07/01/2014    History of Present Illness 59 yo male s/p L5-s1 diskectomy and insertion of intervertbral biomechanial device and pedicle screw fixation PMH: seizures, fibromyalgia, R rotator cuff syndrome, neck pain, depression, anxiety,HTN, R knee arthroscopy    PT Comments    Pt is progressing well with his mobility.  He did better with cues with bed mobility today, but will need log roll and reverse log roll reinforced.  Per pt he is to d/c home in AM.  PT will continue to follow acutely to reinforce safe mobility and education.   Follow Up Recommendations  Home health PT;Supervision for mobility/OOB     Equipment Recommendations  None recommended by PT    Recommendations for Other Services   NA     Precautions / Restrictions Precautions Precautions: Back Precaution Comments: reviewed back precautions, lifting restrictions, activity progression, and no sitting for longer than 30-45 mins at a time.  Required Braces or Orthoses: Spinal Brace Spinal Brace: Lumbar corset;Applied in sitting position    Mobility  Bed Mobility Overal bed mobility: Modified Independent Bed Mobility: Rolling;Sidelying to Sit Rolling: Modified independent (Device/Increase time) Sidelying to sit: Modified independent (Device/Increase time)       General bed mobility comments: Verbal cues needed for correct log roll technique.  HOB flat and no rail today.   Transfers Overall transfer level: Needs assistance Equipment used: Rolling walker (2 wheeled) Transfers: Sit to/from Stand Sit to Stand: Supervision         General transfer comment: supervision for safety due to slow speed of transitions. Safe hand placement without cues.  Ambulation/Gait Ambulation/Gait assistance: Supervision Ambulation Distance (Feet): 150 Feet Assistive device: Rolling walker (2 wheeled) Gait  Pattern/deviations: Step-through pattern;Trunk flexed;Shuffle Gait velocity: decreased Gait velocity interpretation: Below normal speed for age/gender General Gait Details: Verbal cues for upright posture, slow, but steady gait with RW.           Balance Overall balance assessment: Needs assistance Sitting-balance support: Feet supported;No upper extremity supported Sitting balance-Leahy Scale: Good     Standing balance support: Bilateral upper extremity supported;No upper extremity supported;Single extremity supported Standing balance-Leahy Scale: Good                      Cognition Arousal/Alertness: Awake/alert Behavior During Therapy: WFL for tasks assessed/performed Overall Cognitive Status: Within Functional Limits for tasks assessed                             Pertinent Vitals/Pain Pain Assessment: 0-10 Pain Score: 7  Pain Location: low back/incisional Pain Descriptors / Indicators: Aching;Burning Pain Intervention(s): Limited activity within patient's tolerance;Monitored during session;Repositioned           PT Goals (current goals can now be found in the care plan section) Acute Rehab PT Goals Patient Stated Goal: Return home Progress towards PT goals: Progressing toward goals    Frequency  Min 5X/week    PT Plan Current plan remains appropriate       End of Session Equipment Utilized During Treatment: Back brace Activity Tolerance: Patient tolerated treatment well Patient left: in chair;with call bell/phone within reach;with family/visitor present     Time: 4098-1191 PT Time Calculation (min) (ACUTE ONLY): 26 min  Charges:  $Gait Training: 8-22 mins $Self Care/Home Management: 8-22  Rollene Rotundaebecca B. Cullen Vanallen, PT, DPT [phone]   07/01/2014, 4:38 PM
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
## 2014-07-02 NOTE — Progress Notes (Signed)
Physical Therapy Treatment and Discharge Patient Details Name: [name] MRN: [id number] DOB: 13-Jun-1955 Today's Date: 07/02/2014    History of Present Illness 59 yo male s/p L5-s1 diskectomy and insertion of intervertbral biomechanial device and pedicle screw fixation PMH: seizures, fibromyalgia, R rotator cuff syndrome, neck pain, depression, anxiety,HTN, R knee arthroscopy    PT Comments    Pt has progressed well, is mod I with mobility, including in and out of bed, transfers, ambulation, and even stairs. Pt able to verbalize precautions, restrictions, proper posture, and activity level for home. Goals met, d/c from acute PT. All further needs deferred to [facility] setting.    Follow Up Recommendations  Home health PT;Supervision for mobility/OOB     Equipment Recommendations  None recommended by PT    Recommendations for Other Services       Precautions / Restrictions Precautions Precautions: Back Precaution Comments: pt able to vrebalize all back precautions and restrictions. Discussed posture during lying, sitting standing, and pt demonstrated understanding of this as well.  Required Braces or Orthoses: Spinal Brace Spinal Brace: Lumbar corset;Applied in sitting position Restrictions Weight Bearing Restrictions: No Other Position/Activity Restrictions: pt able to apply brace independently    Mobility  Bed Mobility Overal bed mobility: Modified Independent Bed Mobility: Rolling;Sidelying to Sit Rolling: Modified independent (Device/Increase time) Sidelying to sit: Modified independent (Device/Increase time)       General bed mobility comments: pt performing bed mobility safely and independently with log roll  Transfers Overall transfer level: Modified independent Equipment used: Rolling walker (2 wheeled) Transfers: Sit to/from Stand Sit to Stand: Modified independent (Device/Increase time)            Ambulation/Gait Ambulation/Gait assistance: Modified  independent (Device/Increase time) Ambulation Distance (Feet): 160 Feet Assistive device: Rolling walker (2 wheeled) Gait Pattern/deviations: Step-through pattern;Decreased stride length;Trunk flexed Gait velocity: decreased   General Gait Details: pt began with flexed posture but improved with vc's, discussed plumb line ear, shoulder, hip, posture improved with distance   Stairs Stairs: Yes Stairs assistance: Modified independent (Device/Increase time) Stair Management: Two rails;Step to pattern;Forwards Number of Stairs: 5 General stair comments: pt does not have stairs but practiced for stregthening and pt able to perform safely. Practiced step to and step through pattern  Wheelchair Mobility    Modified Rankin (Stroke Patients Only)       Balance Overall balance assessment: Modified Independent Sitting-balance support: No upper extremity supported Sitting balance-Leahy Scale: Good     Standing balance support: No upper extremity supported Standing balance-Leahy Scale: Good Standing balance comment: guarded due to pain, but balance good for dressing and donning of brace                    Cognition Arousal/Alertness: Awake/alert Behavior During Therapy: WFL for tasks assessed/performed Overall Cognitive Status: Within Functional Limits for tasks assessed                      Exercises      General Comments General comments (skin integrity, edema, etc.): discussed d/c concerns and activity level for home      Pertinent Vitals/Pain Pain Assessment: 0-10 Pain Score: 5  Pain Location: back Pain Intervention(s): Monitored during session;Premedicated before session    Home Living                      Prior Function            PT Goals (current goals can  now be found in the care plan section) Acute Rehab PT Goals Patient Stated Goal: Return home PT Goal Formulation: With patient/family Time For Goal Achievement: 07/14/14 Potential to  Achieve Goals: Good Progress towards PT goals: Goals met/education completed, patient discharged from PT    Frequency  Min 5X/week    PT Plan Current plan remains appropriate    Co-evaluation             End of Session Equipment Utilized During Treatment: Back brace Activity Tolerance: Patient tolerated treatment well Patient left: in chair;with call bell/phone within reach     Time: 0815-0845 PT Time Calculation (min) (ACUTE ONLY): 30 min  Charges:  $Gait Training: 23-37 mins                    G Codes:     Leighton Roach, PT  Acute Rehab Services  [phone]  Leighton Roach 07/02/2014, 11:04 AM

## 2014-07-04 DIAGNOSIS — M545 Low back pain: Secondary | ICD-10-CM | POA: Diagnosis not present

## 2014-07-04 DIAGNOSIS — M1711 Unilateral primary osteoarthritis, right knee: Secondary | ICD-10-CM | POA: Diagnosis not present

## 2014-07-04 DIAGNOSIS — M47816 Spondylosis without myelopathy or radiculopathy, lumbar region: Secondary | ICD-10-CM | POA: Diagnosis not present

## 2014-07-04 DIAGNOSIS — G894 Chronic pain syndrome: Secondary | ICD-10-CM | POA: Diagnosis not present

## 2014-07-07 DIAGNOSIS — Z4789 Encounter for other orthopedic aftercare: Secondary | ICD-10-CM | POA: Diagnosis not present

## 2014-07-07 DIAGNOSIS — M4317 Spondylolisthesis, lumbosacral region: Secondary | ICD-10-CM | POA: Diagnosis not present

## 2014-07-07 DIAGNOSIS — M549 Dorsalgia, unspecified: Secondary | ICD-10-CM | POA: Diagnosis not present

## 2014-07-07 DIAGNOSIS — M79661 Pain in right lower leg: Secondary | ICD-10-CM | POA: Diagnosis not present
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
## 2014-07-11 DIAGNOSIS — M4317 Spondylolisthesis, lumbosacral region: Secondary | ICD-10-CM | POA: Diagnosis not present

## 2014-07-11 DIAGNOSIS — M549 Dorsalgia, unspecified: Secondary | ICD-10-CM | POA: Diagnosis not present

## 2014-07-11 DIAGNOSIS — Z4789 Encounter for other orthopedic aftercare: Secondary | ICD-10-CM | POA: Diagnosis not present

## 2014-07-11 DIAGNOSIS — M79661 Pain in right lower leg: Secondary | ICD-10-CM | POA: Diagnosis not present

## 2014-07-13 DIAGNOSIS — M549 Dorsalgia, unspecified: Secondary | ICD-10-CM | POA: Diagnosis not present

## 2014-07-13 DIAGNOSIS — Z4789 Encounter for other orthopedic aftercare: Secondary | ICD-10-CM | POA: Diagnosis not present

## 2014-07-13 DIAGNOSIS — M4317 Spondylolisthesis, lumbosacral region: Secondary | ICD-10-CM | POA: Diagnosis not present

## 2014-07-13 DIAGNOSIS — M79661 Pain in right lower leg: Secondary | ICD-10-CM | POA: Diagnosis not present

## 2014-07-18 DIAGNOSIS — M79661 Pain in right lower leg: Secondary | ICD-10-CM | POA: Diagnosis not present

## 2014-07-18 DIAGNOSIS — Z4789 Encounter for other orthopedic aftercare: Secondary | ICD-10-CM | POA: Diagnosis not present

## 2014-07-18 DIAGNOSIS — M549 Dorsalgia, unspecified: Secondary | ICD-10-CM | POA: Diagnosis not present

## 2014-07-18 DIAGNOSIS — M4317 Spondylolisthesis, lumbosacral region: Secondary | ICD-10-CM | POA: Diagnosis not present

## 2014-07-19 DIAGNOSIS — Z4789 Encounter for other orthopedic aftercare: Secondary | ICD-10-CM | POA: Diagnosis not present

## 2014-07-19 DIAGNOSIS — M4317 Spondylolisthesis, lumbosacral region: Secondary | ICD-10-CM | POA: Diagnosis not present

## 2014-07-19 DIAGNOSIS — M549 Dorsalgia, unspecified: Secondary | ICD-10-CM | POA: Diagnosis not present

## 2014-07-19 DIAGNOSIS — M79661 Pain in right lower leg: Secondary | ICD-10-CM | POA: Diagnosis not present

## 2014-07-20 DIAGNOSIS — M79661 Pain in right lower leg: Secondary | ICD-10-CM | POA: Diagnosis not present

## 2014-07-20 DIAGNOSIS — Z4789 Encounter for other orthopedic aftercare: Secondary | ICD-10-CM | POA: Diagnosis not present

## 2014-07-20 DIAGNOSIS — M4317 Spondylolisthesis, lumbosacral region: Secondary | ICD-10-CM | POA: Diagnosis not present

## 2014-07-20 DIAGNOSIS — M549 Dorsalgia, unspecified: Secondary | ICD-10-CM | POA: Diagnosis not present

## 2014-07-25 DIAGNOSIS — M549 Dorsalgia, unspecified: Secondary | ICD-10-CM | POA: Diagnosis not present

## 2014-07-25 DIAGNOSIS — M4317 Spondylolisthesis, lumbosacral region: Secondary | ICD-10-CM | POA: Diagnosis not present

## 2014-07-25 DIAGNOSIS — M79661 Pain in right lower leg: Secondary | ICD-10-CM | POA: Diagnosis not present

## 2014-07-25 DIAGNOSIS — Z4789 Encounter for other orthopedic aftercare: Secondary | ICD-10-CM | POA: Diagnosis not present

## 2014-07-28 DIAGNOSIS — Z4789 Encounter for other orthopedic aftercare: Secondary | ICD-10-CM | POA: Diagnosis not present

## 2014-07-28 DIAGNOSIS — M4317 Spondylolisthesis, lumbosacral region: Secondary | ICD-10-CM | POA: Diagnosis not present

## 2014-07-28 DIAGNOSIS — M79661 Pain in right lower leg: Secondary | ICD-10-CM | POA: Diagnosis not present

## 2014-07-28 DIAGNOSIS — M549 Dorsalgia, unspecified: Secondary | ICD-10-CM | POA: Diagnosis not present

## 2014-08-02 DIAGNOSIS — M79661 Pain in right lower leg: Secondary | ICD-10-CM | POA: Diagnosis not present

## 2014-08-02 DIAGNOSIS — Z4789 Encounter for other orthopedic aftercare: Secondary | ICD-10-CM | POA: Diagnosis not present

## 2014-08-02 DIAGNOSIS — M4317 Spondylolisthesis, lumbosacral region: Secondary | ICD-10-CM | POA: Diagnosis not present

## 2014-08-02 DIAGNOSIS — M549 Dorsalgia, unspecified: Secondary | ICD-10-CM | POA: Diagnosis not present

## 2014-08-04 DIAGNOSIS — Z4789 Encounter for other orthopedic aftercare: Secondary | ICD-10-CM | POA: Diagnosis not present

## 2014-08-04 DIAGNOSIS — M4317 Spondylolisthesis, lumbosacral region: Secondary | ICD-10-CM | POA: Diagnosis not present

## 2014-08-04 DIAGNOSIS — M549 Dorsalgia, unspecified: Secondary | ICD-10-CM | POA: Diagnosis not present

## 2014-08-04 DIAGNOSIS — M79661 Pain in right lower leg: Secondary | ICD-10-CM | POA: Diagnosis not present

## 2014-08-09 DIAGNOSIS — M549 Dorsalgia, unspecified: Secondary | ICD-10-CM | POA: Diagnosis not present

## 2014-08-09 DIAGNOSIS — Z4789 Encounter for other orthopedic aftercare: Secondary | ICD-10-CM | POA: Diagnosis not present

## 2014-08-09 DIAGNOSIS — M79661 Pain in right lower leg: Secondary | ICD-10-CM | POA: Diagnosis not present

## 2014-08-09 DIAGNOSIS — M4317 Spondylolisthesis, lumbosacral region: Secondary | ICD-10-CM | POA: Diagnosis not present

## 2014-08-11 DIAGNOSIS — M4317 Spondylolisthesis, lumbosacral region: Secondary | ICD-10-CM | POA: Diagnosis not present

## 2014-08-11 DIAGNOSIS — M549 Dorsalgia, unspecified: Secondary | ICD-10-CM | POA: Diagnosis not present

## 2014-08-11 DIAGNOSIS — Z4789 Encounter for other orthopedic aftercare: Secondary | ICD-10-CM | POA: Diagnosis not present

## 2014-08-11 DIAGNOSIS — M79661 Pain in right lower leg: Secondary | ICD-10-CM | POA: Diagnosis not present

## 2014-08-12 DIAGNOSIS — M5441 Lumbago with sciatica, right side: Secondary | ICD-10-CM | POA: Diagnosis not present

## 2014-08-16 DIAGNOSIS — M4317 Spondylolisthesis, lumbosacral region: Secondary | ICD-10-CM | POA: Diagnosis not present

## 2014-08-16 DIAGNOSIS — Z4789 Encounter for other orthopedic aftercare: Secondary | ICD-10-CM | POA: Diagnosis not present

## 2014-08-16 DIAGNOSIS — M79661 Pain in right lower leg: Secondary | ICD-10-CM | POA: Diagnosis not present

## 2014-08-16 DIAGNOSIS — M549 Dorsalgia, unspecified: Secondary | ICD-10-CM | POA: Diagnosis not present

## 2014-08-18 DIAGNOSIS — Z4789 Encounter for other orthopedic aftercare: Secondary | ICD-10-CM | POA: Diagnosis not present

## 2014-08-18 DIAGNOSIS — M79661 Pain in right lower leg: Secondary | ICD-10-CM | POA: Diagnosis not present

## 2014-08-18 DIAGNOSIS — M549 Dorsalgia, unspecified: Secondary | ICD-10-CM | POA: Diagnosis not present

## 2014-08-18 DIAGNOSIS — M4317 Spondylolisthesis, lumbosacral region: Secondary | ICD-10-CM | POA: Diagnosis not present

## 2014-08-23 DIAGNOSIS — M79661 Pain in right lower leg: Secondary | ICD-10-CM | POA: Diagnosis not present

## 2014-08-23 DIAGNOSIS — M549 Dorsalgia, unspecified: Secondary | ICD-10-CM | POA: Diagnosis not present

## 2014-08-23 DIAGNOSIS — M4317 Spondylolisthesis, lumbosacral region: Secondary | ICD-10-CM | POA: Diagnosis not present

## 2014-08-23 DIAGNOSIS — Z4789 Encounter for other orthopedic aftercare: Secondary | ICD-10-CM | POA: Diagnosis not present

## 2014-08-25 DIAGNOSIS — M4317 Spondylolisthesis, lumbosacral region: Secondary | ICD-10-CM | POA: Diagnosis not present

## 2014-08-25 DIAGNOSIS — M549 Dorsalgia, unspecified: Secondary | ICD-10-CM | POA: Diagnosis not present

## 2014-08-25 DIAGNOSIS — M79661 Pain in right lower leg: Secondary | ICD-10-CM | POA: Diagnosis not present

## 2014-08-25 DIAGNOSIS — Z4789 Encounter for other orthopedic aftercare: Secondary | ICD-10-CM | POA: Diagnosis not present

## 2014-08-30 DIAGNOSIS — M79661 Pain in right lower leg: Secondary | ICD-10-CM | POA: Diagnosis not present

## 2014-08-30 DIAGNOSIS — M4317 Spondylolisthesis, lumbosacral region: Secondary | ICD-10-CM | POA: Diagnosis not present

## 2014-08-30 DIAGNOSIS — Z4789 Encounter for other orthopedic aftercare: Secondary | ICD-10-CM | POA: Diagnosis not present

## 2014-08-30 DIAGNOSIS — M549 Dorsalgia, unspecified: Secondary | ICD-10-CM | POA: Diagnosis not present

## 2014-09-02 DIAGNOSIS — M4317 Spondylolisthesis, lumbosacral region: Secondary | ICD-10-CM | POA: Diagnosis not present

## 2014-09-02 DIAGNOSIS — Z4789 Encounter for other orthopedic aftercare: Secondary | ICD-10-CM | POA: Diagnosis not present

## 2014-09-02 DIAGNOSIS — M79661 Pain in right lower leg: Secondary | ICD-10-CM | POA: Diagnosis not present

## 2014-09-02 DIAGNOSIS — M549 Dorsalgia, unspecified: Secondary | ICD-10-CM | POA: Diagnosis not present

## 2014-09-23 DIAGNOSIS — Z4789 Encounter for other orthopedic aftercare: Secondary | ICD-10-CM | POA: Diagnosis not present

## 2014-09-30 ENCOUNTER — Encounter: Payer: Self-pay | Admitting: Internal Medicine

## 2014-09-30 ENCOUNTER — Other Ambulatory Visit (INDEPENDENT_AMBULATORY_CARE_PROVIDER_SITE_OTHER): Payer: Commercial Managed Care - HMO

## 2014-09-30 ENCOUNTER — Ambulatory Visit (INDEPENDENT_AMBULATORY_CARE_PROVIDER_SITE_OTHER): Payer: Commercial Managed Care - HMO | Admitting: Internal Medicine

## 2014-09-30 VITALS — BP 116/74 | HR 88 | Temp 98.0°F | Ht 67.0 in | Wt 193.0 lb

## 2014-09-30 DIAGNOSIS — Z0189 Encounter for other specified special examinations: Secondary | ICD-10-CM

## 2014-09-30 DIAGNOSIS — E785 Hyperlipidemia, unspecified: Secondary | ICD-10-CM | POA: Diagnosis not present

## 2014-09-30 DIAGNOSIS — R7302 Impaired glucose tolerance (oral): Secondary | ICD-10-CM

## 2014-09-30 DIAGNOSIS — M25561 Pain in right knee: Secondary | ICD-10-CM | POA: Diagnosis not present

## 2014-09-30 DIAGNOSIS — I10 Essential (primary) hypertension: Secondary | ICD-10-CM

## 2014-09-30 DIAGNOSIS — G894 Chronic pain syndrome: Secondary | ICD-10-CM

## 2014-09-30 DIAGNOSIS — Z0000 Encounter for general adult medical examination without abnormal findings: Secondary | ICD-10-CM

## 2014-09-30 LAB — LIPID PANEL
CHOLESTEROL: 123 mg/dL (ref 0–200)
HDL: 36.5 mg/dL — ABNORMAL LOW (ref >39.00)
LDL Cholesterol: 67 mg/dL (ref 0–99)
NonHDL: 86.5
Total CHOL/HDL Ratio: 3
Triglycerides: 100 mg/dL (ref 0.0–149.0)
VLDL: 20 mg/dL (ref 0.0–40.0)

## 2014-09-30 LAB — HEPATIC FUNCTION PANEL
ALK PHOS: 110 U/L (ref 39–117)
ALT: 24 U/L (ref 0–53)
AST: 21 U/L (ref 0–37)
Albumin: 4.2 g/dL (ref 3.5–5.2)
Bilirubin, Direct: 0.2 mg/dL (ref 0.0–0.3)
Total Bilirubin: 0.7 mg/dL (ref 0.2–1.2)
Total Protein: 7.7 g/dL (ref 6.0–8.3)

## 2014-09-30 LAB — BASIC METABOLIC PANEL
BUN: 17 mg/dL (ref 6–23)
CALCIUM: 9.5 mg/dL (ref 8.4–10.5)
CO2: 33 meq/L — AB (ref 19–32)
Chloride: 100 mEq/L (ref 96–112)
Creatinine, Ser: 1.2 mg/dL (ref 0.40–1.50)
GFR: 66.19 mL/min (ref >60.00)
Glucose, Bld: 87 mg/dL (ref 70–99)
Potassium: 3.4 mEq/L — ABNORMAL LOW (ref 3.5–5.1)
SODIUM: 140 meq/L (ref 135–145)

## 2014-09-30 LAB — HEMOGLOBIN A1C: HEMOGLOBIN A1C: 5.2 % (ref 4.6–6.5)

## 2014-09-30 MED ORDER — OMEPRAZOLE 40 MG PO CPDR: 40.0000 mg | DELAYED_RELEASE_CAPSULE | Freq: Every day | ORAL | Status: DC

## 2014-09-30 MED ORDER — AMLODIPINE BESYLATE 5 MG PO TABS: 5.0000 mg | ORAL_TABLET | Freq: Every day | ORAL | Status: DC

## 2014-09-30 MED ORDER — LAMOTRIGINE 200 MG PO TABS: 200.0000 mg | ORAL_TABLET | Freq: Every day | ORAL | Status: DC

## 2014-09-30 MED ORDER — ATORVASTATIN CALCIUM 10 MG PO TABS: 10.0000 mg | ORAL_TABLET | Freq: Every day | ORAL | Status: DC

## 2014-09-30 MED ORDER — CLONAZEPAM 2 MG PO TABS: 2.0000 mg | ORAL_TABLET | Freq: Every evening | ORAL | Status: DC | PRN

## 2014-09-30 MED ORDER — AMITRIPTYLINE HCL 100 MG PO TABS: ORAL_TABLET | ORAL | Status: DC

## 2014-09-30 NOTE — Assessment & Plan Note (Signed)
Asympt, stable overall by history and exam, recent data reviewed with pt, and pt to continue medical treatment as before,  to f/u any worsening symptoms or concerns Lab Results  Component Value Date   HGBA1C 5.3 04/06/2014

## 2014-09-30 NOTE — Assessment & Plan Note (Signed)
Also for referral pain management that he states he needs to be seen every 3 mo, with [facility]
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
## 2014-09-30 NOTE — Progress Notes (Signed)
Pre visit review using our clinic review tool, if applicable. No additional management support is needed unless otherwise documented below in the visit note. 

## 2014-09-30 NOTE — Assessment & Plan Note (Signed)
stable overall by history and exam, recent data reviewed with pt, and pt to continue medical treatment as before,  to f/u any worsening symptoms or concerns Lab Results  Component Value Date   LDLCALC 99 03/30/2012    for f/u lab, lower chol diet

## 2014-09-30 NOTE — Patient Instructions (Signed)
Please continue all other medications as before, and refills have been done if requested to [facility].  Please have the pharmacy call with any other refills you may need.  Please continue your efforts at being more active, low cholesterol diet, and weight control.  Please keep your appointments with your specialists as you may have planned  You will be contacted regarding the referral for: [facility]  Please go to the LAB in the Basement (turn left off the elevator) for the tests to be done today  You will be contacted by phone if any changes need to be made immediately.  Otherwise, you will receive a letter about your results with an explanation, but please check with MyChart first.  Please remember to sign up for MyChart if you have not done so, as this will be important to you in the future with finding out test results, communicating by private email, and scheduling acute appointments online when needed.  Please return in 6 months, or sooner if needed, with Lab testing done 3-5 days before

## 2014-09-30 NOTE — Assessment & Plan Note (Signed)
stable overall by history and exam, recent data reviewed with pt, and pt to continue medical treatment as before,  to f/u any worsening symptoms or concerns BP Readings from Last 3 Encounters:  09/30/14 116/74  07/02/14 117/69  06/21/14 114/75

## 2014-09-30 NOTE — Assessment & Plan Note (Signed)
Gradually worsening in the past yr, will likely need eventual TKR, declines ortho referral for now

## 2014-10-05 ENCOUNTER — Ambulatory Visit: Payer: Commercial Managed Care - HMO | Admitting: Internal Medicine

## 2014-10-12 DIAGNOSIS — M1711 Unilateral primary osteoarthritis, right knee: Secondary | ICD-10-CM | POA: Diagnosis not present

## 2014-10-12 DIAGNOSIS — Z5181 Encounter for therapeutic drug level monitoring: Secondary | ICD-10-CM | POA: Diagnosis not present

## 2014-10-12 DIAGNOSIS — M47816 Spondylosis without myelopathy or radiculopathy, lumbar region: Secondary | ICD-10-CM | POA: Diagnosis not present

## 2014-10-12 DIAGNOSIS — M545 Low back pain: Secondary | ICD-10-CM | POA: Diagnosis not present

## 2014-10-12 DIAGNOSIS — Z79899 Other long term (current) drug therapy: Secondary | ICD-10-CM | POA: Diagnosis not present

## 2014-10-12 DIAGNOSIS — G894 Chronic pain syndrome: Secondary | ICD-10-CM | POA: Diagnosis not present

## 2015-01-03 DIAGNOSIS — Z4789 Encounter for other orthopedic aftercare: Secondary | ICD-10-CM | POA: Diagnosis not present

## 2015-01-11 DIAGNOSIS — M545 Low back pain: Secondary | ICD-10-CM | POA: Diagnosis not present

## 2015-01-11 DIAGNOSIS — Z9689 Presence of other specified functional implants: Secondary | ICD-10-CM | POA: Diagnosis not present

## 2015-01-11 DIAGNOSIS — M961 Postlaminectomy syndrome, not elsewhere classified: Secondary | ICD-10-CM | POA: Diagnosis not present

## 2015-01-11 DIAGNOSIS — M1711 Unilateral primary osteoarthritis, right knee: Secondary | ICD-10-CM | POA: Diagnosis not present

## 2015-03-06 ENCOUNTER — Other Ambulatory Visit: Payer: Self-pay | Admitting: Internal Medicine

## 2015-04-04 ENCOUNTER — Ambulatory Visit (INDEPENDENT_AMBULATORY_CARE_PROVIDER_SITE_OTHER): Payer: Commercial Managed Care - HMO | Admitting: Internal Medicine

## 2015-04-04 ENCOUNTER — Other Ambulatory Visit (INDEPENDENT_AMBULATORY_CARE_PROVIDER_SITE_OTHER): Payer: Commercial Managed Care - HMO

## 2015-04-04 ENCOUNTER — Encounter: Payer: Self-pay | Admitting: Internal Medicine

## 2015-04-04 VITALS — BP 128/84 | HR 90 | Temp 97.6°F | Ht 67.0 in | Wt 192.0 lb

## 2015-04-04 DIAGNOSIS — G894 Chronic pain syndrome: Secondary | ICD-10-CM

## 2015-04-04 DIAGNOSIS — I10 Essential (primary) hypertension: Secondary | ICD-10-CM

## 2015-04-04 DIAGNOSIS — R7302 Impaired glucose tolerance (oral): Secondary | ICD-10-CM | POA: Diagnosis not present

## 2015-04-04 DIAGNOSIS — Z0000 Encounter for general adult medical examination without abnormal findings: Secondary | ICD-10-CM

## 2015-04-04 LAB — PSA: PSA: 1.25 ng/mL (ref 0.10–4.00)

## 2015-04-04 LAB — BASIC METABOLIC PANEL
BUN: 21 mg/dL (ref 6–23)
CO2: 31 mEq/L (ref 19–32)
CREATININE: 1.23 mg/dL (ref 0.40–1.50)
Calcium: 9.9 mg/dL (ref 8.4–10.5)
Chloride: 103 mEq/L (ref 96–112)
GFR: 64.22 mL/min (ref >60.00)
GLUCOSE: 76 mg/dL (ref 70–99)
Potassium: 4.3 mEq/L (ref 3.5–5.1)
Sodium: 142 mEq/L (ref 135–145)

## 2015-04-04 LAB — URINALYSIS, ROUTINE W REFLEX MICROSCOPIC
Bilirubin Urine: NEGATIVE
Hgb urine dipstick: NEGATIVE
Ketones, ur: NEGATIVE
LEUKOCYTES UA: NEGATIVE
Nitrite: NEGATIVE
PH: 6 (ref 5.0–8.0)
RBC / HPF: NONE SEEN (ref 0–?)
Urine Glucose: NEGATIVE
Urobilinogen, UA: 0.2 (ref 0.0–1.0)

## 2015-04-04 LAB — CBC WITH DIFFERENTIAL/PLATELET
Basophils Absolute: 0 10*3/uL (ref 0.0–0.1)
Basophils Relative: 0.8 % (ref 0.0–3.0)
EOS PCT: 7.1 % — AB (ref 0.0–5.0)
Eosinophils Absolute: 0.4 10*3/uL (ref 0.0–0.7)
HCT: 45.3 % (ref 39.0–52.0)
Hemoglobin: 15.4 g/dL (ref 13.0–17.0)
LYMPHS ABS: 1.8 10*3/uL (ref 0.7–4.0)
Lymphocytes Relative: 33.1 % (ref 12.0–46.0)
MCHC: 33.8 g/dL (ref 30.0–36.0)
MCV: 87.6 fl (ref 78.0–100.0)
MONO ABS: 0.5 10*3/uL (ref 0.1–1.0)
MONOS PCT: 9.1 % (ref 3.0–12.0)
NEUTROS ABS: 2.6 10*3/uL (ref 1.4–7.7)
Neutrophils Relative %: 49.9 % (ref 43.0–77.0)
PLATELETS: 189 10*3/uL (ref 150.0–400.0)
RBC: 5.18 Mil/uL (ref 4.22–5.81)
RDW: 14.2 % (ref 11.5–15.5)
WBC: 5.3 10*3/uL (ref 4.0–10.5)

## 2015-04-04 LAB — HEPATIC FUNCTION PANEL
ALBUMIN: 4.6 g/dL (ref 3.5–5.2)
ALT: 32 U/L (ref 0–53)
AST: 24 U/L (ref 0–37)
Alkaline Phosphatase: 103 U/L (ref 39–117)
BILIRUBIN TOTAL: 0.8 mg/dL (ref 0.2–1.2)
Bilirubin, Direct: 0.1 mg/dL (ref 0.0–0.3)
Total Protein: 7.5 g/dL (ref 6.0–8.3)

## 2015-04-04 LAB — LIPID PANEL
CHOL/HDL RATIO: 4
Cholesterol: 163 mg/dL (ref 0–200)
HDL: 40.6 mg/dL (ref >39.00)
LDL Cholesterol: 91 mg/dL (ref 0–99)
NonHDL: 122.88
Triglycerides: 157 mg/dL — ABNORMAL HIGH (ref 0.0–149.0)
VLDL: 31.4 mg/dL (ref 0.0–40.0)

## 2015-04-04 LAB — TSH: TSH: 1.18 u[IU]/mL (ref 0.35–4.50)

## 2015-04-04 LAB — HEMOGLOBIN A1C: Hgb A1c MFr Bld: 5.5 % (ref 4.6–6.5)

## 2015-04-04 MED ORDER — CLONAZEPAM 2 MG PO TABS: 2.0000 mg | ORAL_TABLET | Freq: Every evening | ORAL | Status: DC | PRN

## 2015-04-04 MED ORDER — AMITRIPTYLINE HCL 100 MG PO TABS: 100.0000 mg | ORAL_TABLET | Freq: Every day | ORAL | Status: DC

## 2015-04-04 NOTE — Assessment & Plan Note (Signed)
stable overall by history and exam, recent data reviewed with pt, and pt to continue medical treatment as before,  to f/u any worsening symptoms or concerns BP Readings from Last 3 Encounters:  04/04/15 128/84  09/30/14 116/74  07/02/14 117/69

## 2015-04-04 NOTE — Progress Notes (Signed)
Pre visit review using our clinic review tool, if applicable. No additional management support is needed unless otherwise documented below in the visit note. 

## 2015-04-04 NOTE — Assessment & Plan Note (Signed)
stable overall by history and exam, recent data reviewed with pt, and pt to continue medical treatment as before,  to f/u any worsening symptoms or concerns Lab Results  Component Value Date   HGBA1C 5.2 09/30/2014

## 2015-04-04 NOTE — Assessment & Plan Note (Signed)
stable overall by history and exam and pt to continue medical treatment as before,  to f/u any worsening symptoms or concerns, ok for referal re-up to pain management

## 2015-04-04 NOTE — Patient Instructions (Addendum)
Please continue all other medications as before, and refills have been done if requested.  Please have the pharmacy call with any other refills you may need.  Please continue your efforts at being more active, low cholesterol diet, and weight control.  You are otherwise up to date with prevention measures today.  Please keep your appointments with your specialists as you may have planned  You will be contacted regarding the referral [email or web address]:pain management  Please go to the LAB in the Basement (turn left off the elevator) for the tests to be done today  You will be contacted by phone if any changes need to be made immediately.  Otherwise, you will receive a letter about your results with an explanation, but please check with MyChart first.  Please remember to sign up for MyChart if you have not done so, as this will be important to you in the future with finding out test results, communicating by private email, and scheduling acute appointments online when needed.  Please return in 6 months, or sooner if needed

## 2015-04-04 NOTE — Assessment & Plan Note (Signed)
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
## 2015-04-05 ENCOUNTER — Encounter: Payer: Self-pay | Admitting: Internal Medicine

## 2015-04-05 DIAGNOSIS — Z79899 Other long term (current) drug therapy: Secondary | ICD-10-CM | POA: Diagnosis not present

## 2015-04-05 DIAGNOSIS — M1711 Unilateral primary osteoarthritis, right knee: Secondary | ICD-10-CM | POA: Diagnosis not present

## 2015-04-05 DIAGNOSIS — M179 Osteoarthritis of knee, unspecified: Secondary | ICD-10-CM | POA: Diagnosis not present

## 2015-04-05 DIAGNOSIS — Z5181 Encounter for therapeutic drug level monitoring: Secondary | ICD-10-CM | POA: Diagnosis not present

## 2015-04-05 DIAGNOSIS — M961 Postlaminectomy syndrome, not elsewhere classified: Secondary | ICD-10-CM | POA: Diagnosis not present

## 2015-04-05 DIAGNOSIS — M545 Low back pain: Secondary | ICD-10-CM | POA: Diagnosis not present

## 2015-04-05 DIAGNOSIS — Z9689 Presence of other specified functional implants: Secondary | ICD-10-CM | POA: Diagnosis not present

## 2015-04-05 LAB — HEPATITIS C ANTIBODY: HCV Ab: NEGATIVE

## 2015-04-06 ENCOUNTER — Other Ambulatory Visit: Payer: Self-pay | Admitting: Internal Medicine

## 2015-04-13 ENCOUNTER — Encounter: Payer: Self-pay | Admitting: Internal Medicine

## 2015-06-02 ENCOUNTER — Telehealth: Payer: Self-pay | Admitting: Internal Medicine

## 2015-06-02 NOTE — Telephone Encounter (Signed)
States [facility] will be sending a form over for Dr. Jonny RuizJohn to ok amitriptyline to be ok for tier 2 or either for Dr. Jonny RuizJohn to replace with a different medication.  Medication recently jumped from tier 2 to tier 3.   Please give patient a call back to notify once this has been taken care of.

## 2015-06-12 NOTE — Telephone Encounter (Signed)
Pt called in and wanted to know if a different med has been faxed over to [facility]?

## 2015-06-14 NOTE — Telephone Encounter (Signed)
Pt has called in again regarding the encounters below.  Can you please give pt a call

## 2015-06-19 MED ORDER — IMIPRAMINE PAMOATE 100 MG PO CAPS: 100.0000 mg | ORAL_CAPSULE | Freq: Every day | ORAL | Status: DC

## 2015-06-19 NOTE — Telephone Encounter (Signed)
Done erx 

## 2015-06-19 NOTE — Telephone Encounter (Signed)
Received call pt states since the Amitriptyline went up to a tier 3, and not covered by insurance the alternative for med is Imipramine HCL. Requesting md to send rx to [facility]([employer]) (F) [phone]...Raechel Chute/lmb

## 2015-06-19 NOTE — Telephone Encounter (Signed)
Informed pt on vm °

## 2015-06-22 ENCOUNTER — Telehealth: Payer: Self-pay | Admitting: *Deleted

## 2015-06-22 MED ORDER — IMIPRAMINE HCL 50 MG PO TABS: 100.0000 mg | ORAL_TABLET | Freq: Every day | ORAL | Status: DC

## 2015-06-22 NOTE — Telephone Encounter (Signed)
Left msg on triage stating md Rx Imepramine capsule for [t insurance will not cover capsule, but will cover tablets. Wanting to know can we change to tablet form...Raechel Chute/lmb

## 2015-06-26 DIAGNOSIS — G894 Chronic pain syndrome: Secondary | ICD-10-CM | POA: Diagnosis not present

## 2015-06-26 DIAGNOSIS — Z9689 Presence of other specified functional implants: Secondary | ICD-10-CM | POA: Diagnosis not present

## 2015-06-26 DIAGNOSIS — M1711 Unilateral primary osteoarthritis, right knee: Secondary | ICD-10-CM | POA: Diagnosis not present

## 2015-06-26 DIAGNOSIS — M961 Postlaminectomy syndrome, not elsewhere classified: Secondary | ICD-10-CM | POA: Diagnosis not present

## 2015-06-26 DIAGNOSIS — M545 Low back pain: Secondary | ICD-10-CM | POA: Diagnosis not present

## 2015-06-27 ENCOUNTER — Telehealth: Payer: Self-pay | Admitting: Internal Medicine

## 2015-06-27 NOTE — Telephone Encounter (Signed)
Please advise, can the patient take something over the counter for this?

## 2015-06-27 NOTE — Telephone Encounter (Signed)
Pt can try melatonin OTC, thanks

## 2015-06-27 NOTE — Telephone Encounter (Signed)
Patient called to advise that the pain clinic told him that there is a drug risk with the patient continuing to take clonazepam and dilaudid.   The patient called to advise that he d/c clonazepam and began the taper process  He is asking that you give him a call to see if there is something that is OTC or prescription to assist him with sleeping now.

## 2015-06-28 NOTE — Telephone Encounter (Signed)
Called and advised.

## 2015-07-21 ENCOUNTER — Encounter: Payer: Self-pay | Admitting: *Deleted

## 2015-07-21 ENCOUNTER — Emergency Department
Admission: EM | Admit: 2015-07-21 | Discharge: 2015-07-21 | Disposition: A | Discharge status: Home / Self Care | Payer: Commercial Managed Care - HMO | Source: Home / Self Care | Attending: Family Medicine | Admitting: Family Medicine

## 2015-07-21 DIAGNOSIS — H6123 Impacted cerumen, bilateral: Secondary | ICD-10-CM

## 2015-07-21 NOTE — ED Notes (Signed)
Pt c/o 4 days of right ear fullness, decrease in hearing and tinnitus. Tinnitus resolved after 3 days. Tried flushing unsuccessfully at home.

## 2015-07-21 NOTE — Discharge Instructions (Signed)
Cerumen Impaction The structures of the external ear canal secrete a waxy substance known as cerumen. Excess cerumen can build up in the ear canal, causing a condition known as cerumen impaction. Cerumen impaction can cause ear pain and disrupt the function of the ear. The rate of cerumen production differs for each individual. In certain individuals, the configuration of the ear canal may decrease his or her ability to naturally remove cerumen. CAUSES Cerumen impaction is caused by excessive cerumen production or buildup. RISK FACTORS  Frequent use of swabs to clean ears.  Having narrow ear canals.  Having eczema.  Being dehydrated. SIGNS AND SYMPTOMS  Diminished hearing.  Ear drainage.  Ear pain.  Ear itch. TREATMENT Treatment may involve:  Over-the-counter or prescription ear drops to soften the cerumen.  Removal of cerumen by a health care provider. This may be done with:  Irrigation with warm water. This is the most common method of removal.  Ear curettes and other instruments.  Surgery. This may be done in severe cases. HOME CARE INSTRUCTIONS  Take medicines only as directed by your health care provider.  Do not insert objects into the ear with the intent of cleaning the ear. PREVENTION  Do not insert objects into the ear, even with the intent of cleaning the ear. Removing cerumen as a part of normal hygiene is not necessary, and the use of swabs in the ear canal is not recommended.  Drink enough water to keep your urine clear or pale yellow.  Control your eczema if you have it. SEEK MEDICAL CARE IF:  You develop ear pain.  You develop bleeding from the ear.  The cerumen does not clear after you use ear drops as directed.   This information is not intended to replace advice given to you by your health care provider. Make sure you discuss any questions you have with your health care provider.   Document Released: 04/18/2004 Document Revised: 04/01/2014  Document Reviewed: 10/26/2014 Elsevier Interactive Patient Education 2016 [employer]  

## 2015-07-21 NOTE — ED Provider Notes (Signed)
CSN: [id number]     Arrival date & time 07/21/15  1520 History   First MD Initiated Contact with Patient 07/21/15 1544     Chief Complaint  Patient presents with  . Cerumen Impaction     HPI Comments: Patient developed bilateral ear fullness and ringing in his four days ago.  His tinnitus resolved but he still has sensation of his ears being clogged.  The history is provided by the patient.    Past Medical History  Diagnosis Date  . Hypertension   . Seizures (HCC)   . Personal history of venous thrombosis and embolism 02/01/2007  . RASH-NONVESICULAR 05/12/2007  . INSOMNIA-SLEEP DISORDER-UNSPEC 03/07/2009  . SEIZURE DISORDER 09/29/2006  . FIBROMYALGIA 02/01/2007  . ROTATOR CUFF SYNDROME, RIGHT 02/01/2007  . BACK PAIN 05/12/2007  . NECK PAIN, LEFT 03/07/2009  . DISC DISEASE, CERVICAL 02/01/2007  . SPONDYLOSIS, LUMBAR 02/01/2007  . PEPTIC ULCER DISEASE 02/01/2007  . GERD 09/29/2006  . BRONCHITIS, ACUTE 02/27/2009  . HYPERTENSION 09/29/2006  . PAIN, CHRONIC NEC 09/29/2006  . DEPRESSION 09/29/2006  . ANXIETY 09/29/2006  . PROTEIN C DEFICIENCY 09/29/2006  . ANEMIA, PROTEIN-DEFICIENCY 09/29/2006  . Impaired glucose tolerance 04/02/2011  . SLEEP APNEA, OBSTRUCTIVE, MODERATE 09/29/2006    was told CPAP would not help after he had an accident 2006  . Osteoporosis    Past Surgical History  Procedure Laterality Date  . Electrical stimulator in back    . Knee arthroscopy  04/2010    right  . Appendectomy  age 60  . Facial reconstruction surgery  2006  . Stimulator revision  2012  . Spinal cord stimulator battery exchange N/A 06/29/2014    Procedure: SPINAL CORD STIMULATOR BATTERY EXCHANGE;  Surgeon: Venita Lickahari Brooks, MD;  Location: [facility] OR;  Service: Orthopedics;  Laterality: N/A;  . Lumbar fusion  06/29/2014    L5 - S1    STIMULATOR INSERTION   Family History  Problem Relation Age of Onset  . Hypertension Other   . Colon cancer Neg Hx    Social History  Substance Use Topics  . Smoking status: Never Smoker    . Smokeless tobacco: Never Used  . Alcohol Use: No    Review of Systems No sore throat No cough No pleuritic pain No wheezing No nasal congestion No post-nasal drainage No sinus pain/pressure No itchy/red eyes ? earache No hemoptysis No SOB No fever/chills No nausea No vomiting No abdominal pain No diarrhea No urinary symptoms No skin rash No fatigue No myalgias No headache Used OTC meds without relief  Allergies  Topical sulfur and Topamax  Home Medications   Prior to Admission medications   Medication Sig Start Date End Date Taking? Authorizing Provider  amitriptyline (ELAVIL) 100 MG tablet Take 1 tablet (100 mg total) by mouth at bedtime. 04/04/15   Corwin LevinsJames W John, MD  amLODipine (NORVASC) 5 MG tablet Take 1 tablet (5 mg total) by mouth daily. 09/30/14   Corwin LevinsJames W John, MD  atorvastatin (LIPITOR) 10 MG tablet Take 1 tablet (10 mg total) by mouth daily. 09/30/14   Corwin LevinsJames W John, MD  cholecalciferol (VITAMIN D) 1000 UNITS tablet Take 1,000 Units by mouth daily. Reported on 04/04/2015    Historical Provider, MD  clonazePAM (KLONOPIN) 2 MG tablet Take 1 tablet (2 mg total) by mouth at bedtime as needed. 04/04/15   Corwin LevinsJames W John, MD  DIPHENHYDRAMINE HCL, TOPICAL, 2 % GEL Apply 1 application topically 2 (two) times daily as needed (itching). Reported on 04/04/2015  Historical Provider, MD  HYDROmorphone (DILAUDID) 4 MG tablet Take 4 mg by mouth 3 (three) times daily as needed. For pain 06/11/14   Historical Provider, MD  imipramine (TOFRANIL) 50 MG tablet Take 2 tablets (100 mg total) by mouth at bedtime. 06/22/15   Corwin Levins, MD  lamoTRIgine (LAMICTAL) 200 MG tablet Take 1 tablet (200 mg total) by mouth daily. 09/30/14   Corwin Levins, MD  omeprazole (PRILOSEC) 40 MG capsule Take 1 capsule (40 mg total) by mouth daily. 09/30/14 09/30/15  Corwin Levins, MD  tiZANidine (ZANAFLEX) 2 MG tablet TAKE 2 TABLETS(4 MG) BY MOUTH EVERY 8 HOURS AS NEEDED 09/05/14   Historical Provider, MD   Meds  Ordered and Administered this Visit  Medications - No data to display  BP 122/81 mmHg  Pulse 101  Temp(Src) 98.2 F (36.8 C) (Oral)  Wt 185 lb (83.915 kg)  SpO2 98% No data found.   Physical Exam Nursing notes and Vital Signs reviewed. Appearance:  Patient appears stated age, and in no acute distress Eyes:  Pupils are equal, round, and reactive to light and accomodation.  Extraocular movement is intact.  Conjunctivae are not inflamed  Ears:  Canals occluded with cerumen bilaterally.  Post lavage, canals normal.  Tympanic membranes normal.  Nose:  Normal turbinates Pharynx:  Normal Neck:  Supple.  No adenopathy. Skin:  No rash present.   ED Course  Procedures Bilateral ear lavage by nurse  MDM   1. Cerumen impaction, bilateral    Return as needed    Lattie Haw, MD 07/21/15 1554

## 2015-08-22 DIAGNOSIS — M545 Low back pain: Secondary | ICD-10-CM | POA: Diagnosis not present

## 2015-08-22 DIAGNOSIS — M25521 Pain in right elbow: Secondary | ICD-10-CM | POA: Diagnosis not present

## 2015-08-22 DIAGNOSIS — M1711 Unilateral primary osteoarthritis, right knee: Secondary | ICD-10-CM | POA: Diagnosis not present

## 2015-08-22 DIAGNOSIS — M961 Postlaminectomy syndrome, not elsewhere classified: Secondary | ICD-10-CM | POA: Diagnosis not present

## 2015-10-04 ENCOUNTER — Other Ambulatory Visit: Payer: Self-pay

## 2015-10-04 ENCOUNTER — Encounter: Payer: Self-pay | Admitting: Internal Medicine

## 2015-10-04 ENCOUNTER — Ambulatory Visit (INDEPENDENT_AMBULATORY_CARE_PROVIDER_SITE_OTHER): Payer: Commercial Managed Care - HMO | Admitting: Internal Medicine

## 2015-10-04 VITALS — BP 122/72 | HR 84 | Temp 98.3°F | Resp 20 | Wt 184.0 lb

## 2015-10-04 DIAGNOSIS — E785 Hyperlipidemia, unspecified: Secondary | ICD-10-CM | POA: Diagnosis not present

## 2015-10-04 DIAGNOSIS — R7302 Impaired glucose tolerance (oral): Secondary | ICD-10-CM

## 2015-10-04 DIAGNOSIS — I10 Essential (primary) hypertension: Secondary | ICD-10-CM

## 2015-10-04 DIAGNOSIS — G40909 Epilepsy, unspecified, not intractable, without status epilepticus: Secondary | ICD-10-CM

## 2015-10-04 DIAGNOSIS — G894 Chronic pain syndrome: Secondary | ICD-10-CM

## 2015-10-04 DIAGNOSIS — R6889 Other general symptoms and signs: Secondary | ICD-10-CM

## 2015-10-04 DIAGNOSIS — Z0001 Encounter for general adult medical examination with abnormal findings: Secondary | ICD-10-CM

## 2015-10-04 MED ORDER — ATORVASTATIN CALCIUM 10 MG PO TABS: 10.0000 mg | ORAL_TABLET | Freq: Every day | ORAL | Status: DC

## 2015-10-04 MED ORDER — AMLODIPINE BESYLATE 5 MG PO TABS: 5.0000 mg | ORAL_TABLET | Freq: Every day | ORAL | Status: DC

## 2015-10-04 MED ORDER — OMEPRAZOLE 40 MG PO CPDR: 40.0000 mg | DELAYED_RELEASE_CAPSULE | Freq: Every day | ORAL | Status: DC

## 2015-10-04 MED ORDER — LAMOTRIGINE 200 MG PO TABS: 200.0000 mg | ORAL_TABLET | Freq: Every day | ORAL | Status: DC

## 2015-10-04 MED ORDER — ASPIRIN EC 81 MG PO TBEC: 81.0000 mg | DELAYED_RELEASE_TABLET | Freq: Every day | ORAL | Status: AC

## 2015-10-04 MED ORDER — IMIPRAMINE HCL 50 MG PO TABS: 100.0000 mg | ORAL_TABLET | Freq: Every day | ORAL | Status: DC

## 2015-10-04 NOTE — Progress Notes (Signed)
Subjective:    Patient ID: [name], male    DOB: 03/22/1956, 60 y.o.   MRN: [id number]  HPI  Here to f/u; overall doing ok,  Pt denies chest pain, increasing sob or doe, wheezing, orthopnea, PND, increased LE swelling, palpitations, dizziness or syncope.  Pt denies new neurological symptoms such as new headache, or facial or extremity weakness or numbness.  Pt denies polydipsia, polyuria, or low sugar episode.   Pt denies new neurological symptoms such as new headache, or facial or extremity weakness or numbness.   Pt states overall good compliance with meds, mostly trying to follow appropriate diet, with wt overall stable,  but little exercise however.  Cont's to lose wt with better diet.  Wt Readings from Last 3 Encounters:  10/04/15 184 lb (83.462 kg)  07/21/15 185 lb (83.915 kg)  04/04/15 192 lb (87.091 kg)  Now off the klonopin due to possible med risk with oral dilaudid.  No recent sz, last one x 10 yrs, still on oral meds.  Taking melatonin for sleep, and elavil changed to torfranil,  Doing well with those.   Pt asks for statin, wants to get LDL < 70, very hard to do with diet alone.  Needs Referral update to Pain management per insurance Past Medical History  Diagnosis Date  . Hypertension   . Seizures (HCC)   . Personal history of venous thrombosis and embolism 02/01/2007  . RASH-NONVESICULAR 05/12/2007  . INSOMNIA-SLEEP DISORDER-UNSPEC 03/07/2009  . SEIZURE DISORDER 09/29/2006  . FIBROMYALGIA 02/01/2007  . ROTATOR CUFF SYNDROME, RIGHT 02/01/2007  . BACK PAIN 05/12/2007  . NECK PAIN, LEFT 03/07/2009  . DISC DISEASE, CERVICAL 02/01/2007  . SPONDYLOSIS, LUMBAR 02/01/2007  . PEPTIC ULCER DISEASE 02/01/2007  . GERD 09/29/2006  . BRONCHITIS, ACUTE 02/27/2009  . HYPERTENSION 09/29/2006  . PAIN, CHRONIC NEC 09/29/2006  . DEPRESSION 09/29/2006  . ANXIETY 09/29/2006  . PROTEIN C DEFICIENCY 09/29/2006  . ANEMIA, PROTEIN-DEFICIENCY 09/29/2006  . Impaired glucose tolerance 04/02/2011  . SLEEP APNEA,  OBSTRUCTIVE, MODERATE 09/29/2006    was told CPAP would not help after he had an accident 2006  . Osteoporosis    Past Surgical History  Procedure Laterality Date  . Electrical stimulator in back    . Knee arthroscopy  04/2010    right  . Appendectomy  age 60  . Facial reconstruction surgery  2006  . Stimulator revision  2012  . Spinal cord stimulator battery exchange N/A 06/29/2014    Procedure: SPINAL CORD STIMULATOR BATTERY EXCHANGE;  Surgeon: Venita Lickahari Brooks, MD;  Location: [facility] OR;  Service: Orthopedics;  Laterality: N/A;  . Lumbar fusion  06/29/2014    L5 - S1    STIMULATOR INSERTION    reports that he has never smoked. He has never used smokeless tobacco. He reports that he does not drink alcohol or use illicit drugs. family history includes Hypertension in his other. There is no history of Colon cancer. Allergies  Allergen Reactions  . Topical Sulfur Rash  . Topamax [Topiramate] Rash   Current Outpatient Prescriptions on File Prior to Visit  Medication Sig Dispense Refill  . HYDROmorphone (DILAUDID) 4 MG tablet Take 4 mg by mouth 3 (three) times daily as needed. For pain  0   No current facility-administered medications on file prior to visit.   Review of Systems  Constitutional: Negative for unusual diaphoresis or night sweats HENT: Negative for ear swelling or discharge Eyes: Negative for worsening visual haziness  Respiratory: Negative for choking  and stridor.   Gastrointestinal: Negative for distension or worsening eructation Genitourinary: Negative for retention or change in urine volume.  Musculoskeletal: Negative for other MSK pain or swelling Skin: Negative for color change and worsening wound Neurological: Negative for tremors and numbness other than noted  Psychiatric/Behavioral: Negative for decreased concentration or agitation other than above       Objective:   Physical Exam BP 122/72 mmHg  Pulse 84  Temp(Src) 98.3 F (36.8 C) (Oral)  Resp 20  Wt 184 lb  (83.462 kg)  SpO2 97% VS noted,  Constitutional: Pt appears in no apparent distress HENT: Head: NCAT.  Right Ear: External ear normal.  Left Ear: External ear normal.  Eyes: . Pupils are equal, round, and reactive to light. Conjunctivae and EOM are normal Neck: Normal range of motion. Neck supple.  Cardiovascular: Normal rate and regular rhythm.   Pulmonary/Chest: Effort normal and breath sounds without rales or wheezing.  Abd:  Soft, NT, ND, + BS Neurological: Pt is alert. Not confused , motor grossly intact Skin: Skin is warm. No rash, no LE edema Psychiatric: Pt behavior is normal. No agitation.     Assessment & Plan:

## 2015-10-04 NOTE — Patient Instructions (Signed)
Please take all new medication as prescribed - the low dose lipitor  Please continue all other medications as before, and refills have been done if requested.  Please have the pharmacy call with any other refills you may need.  You will be contacted regarding the referral for: pain management  Please keep your appointments with your specialists as you may have planned  Please return in 6 months, or sooner if needed, with Lab testing done 3-5 days before

## 2015-10-04 NOTE — Progress Notes (Signed)
Pre visit review using our clinic review tool, if applicable. No additional management support is needed unless otherwise documented below in the visit note. 

## 2015-10-04 NOTE — Telephone Encounter (Signed)
Medication refills sent to pharmacy 

## 2015-10-12 NOTE — Assessment & Plan Note (Signed)
Stable, Ok for pain management referral , required by insurance

## 2015-10-12 NOTE — Assessment & Plan Note (Signed)
stable overall by history and exam, recent data reviewed with pt, and pt to continue medical treatment as before,  to f/u any worsening symptoms or concerns Lab Results  Component Value Date   LDLCALC 91 04/04/2015

## 2015-10-12 NOTE — Assessment & Plan Note (Signed)
stable overall by history and exam, recent data reviewed with pt, and pt to continue medical treatment as before,  to f/u any worsening symptoms or concerns BP Readings from Last 3 Encounters:  10/04/15 122/72  07/21/15 122/81  04/04/15 128/84

## 2015-10-12 NOTE — Assessment & Plan Note (Signed)
Stable, none recent, cont to follow

## 2015-10-12 NOTE — Assessment & Plan Note (Signed)
stable overall by history and exam, recent data reviewed with pt, and pt to continue medical treatment as before,  to f/u any worsening symptoms or concerns Lab Results  Component Value Date   HGBA1C 5.5 04/04/2015

## 2015-11-01 ENCOUNTER — Telehealth: Payer: Self-pay | Admitting: Internal Medicine

## 2015-11-01 NOTE — Telephone Encounter (Signed)
Pt called needing a [employer] referral to [facility]. Left msg for Marylu LundJanet at the pain clinic to call back regarding appt details.

## 2015-11-14 DIAGNOSIS — Z5181 Encounter for therapeutic drug level monitoring: Secondary | ICD-10-CM | POA: Diagnosis not present

## 2015-11-14 DIAGNOSIS — G894 Chronic pain syndrome: Secondary | ICD-10-CM | POA: Diagnosis not present

## 2015-11-14 DIAGNOSIS — M545 Low back pain: Secondary | ICD-10-CM | POA: Diagnosis not present

## 2015-11-14 DIAGNOSIS — Z9689 Presence of other specified functional implants: Secondary | ICD-10-CM | POA: Diagnosis not present

## 2015-11-14 DIAGNOSIS — Z79899 Other long term (current) drug therapy: Secondary | ICD-10-CM | POA: Diagnosis not present

## 2015-11-14 DIAGNOSIS — M1711 Unilateral primary osteoarthritis, right knee: Secondary | ICD-10-CM | POA: Diagnosis not present

## 2015-11-14 DIAGNOSIS — M961 Postlaminectomy syndrome, not elsewhere classified: Secondary | ICD-10-CM | POA: Diagnosis not present

## 2016-02-01 DIAGNOSIS — M1711 Unilateral primary osteoarthritis, right knee: Secondary | ICD-10-CM | POA: Diagnosis not present

## 2016-02-01 DIAGNOSIS — M961 Postlaminectomy syndrome, not elsewhere classified: Secondary | ICD-10-CM | POA: Diagnosis not present

## 2016-02-01 DIAGNOSIS — G894 Chronic pain syndrome: Secondary | ICD-10-CM | POA: Diagnosis not present

## 2016-02-01 DIAGNOSIS — Z9689 Presence of other specified functional implants: Secondary | ICD-10-CM | POA: Diagnosis not present

## 2016-02-18 IMAGING — CR DG LUMBAR SPINE 2-3V
2 series · 2 of 2 positions shown · non-contrast
Comparison: CT myelogram April 22, 2014 and intraoperative images
June 29, 2014

CLINICAL DATA: Status post lumbar fusion

EXAM:
LUMBAR SPINE - 2-3 VIEW

[t lumbar spine lat]
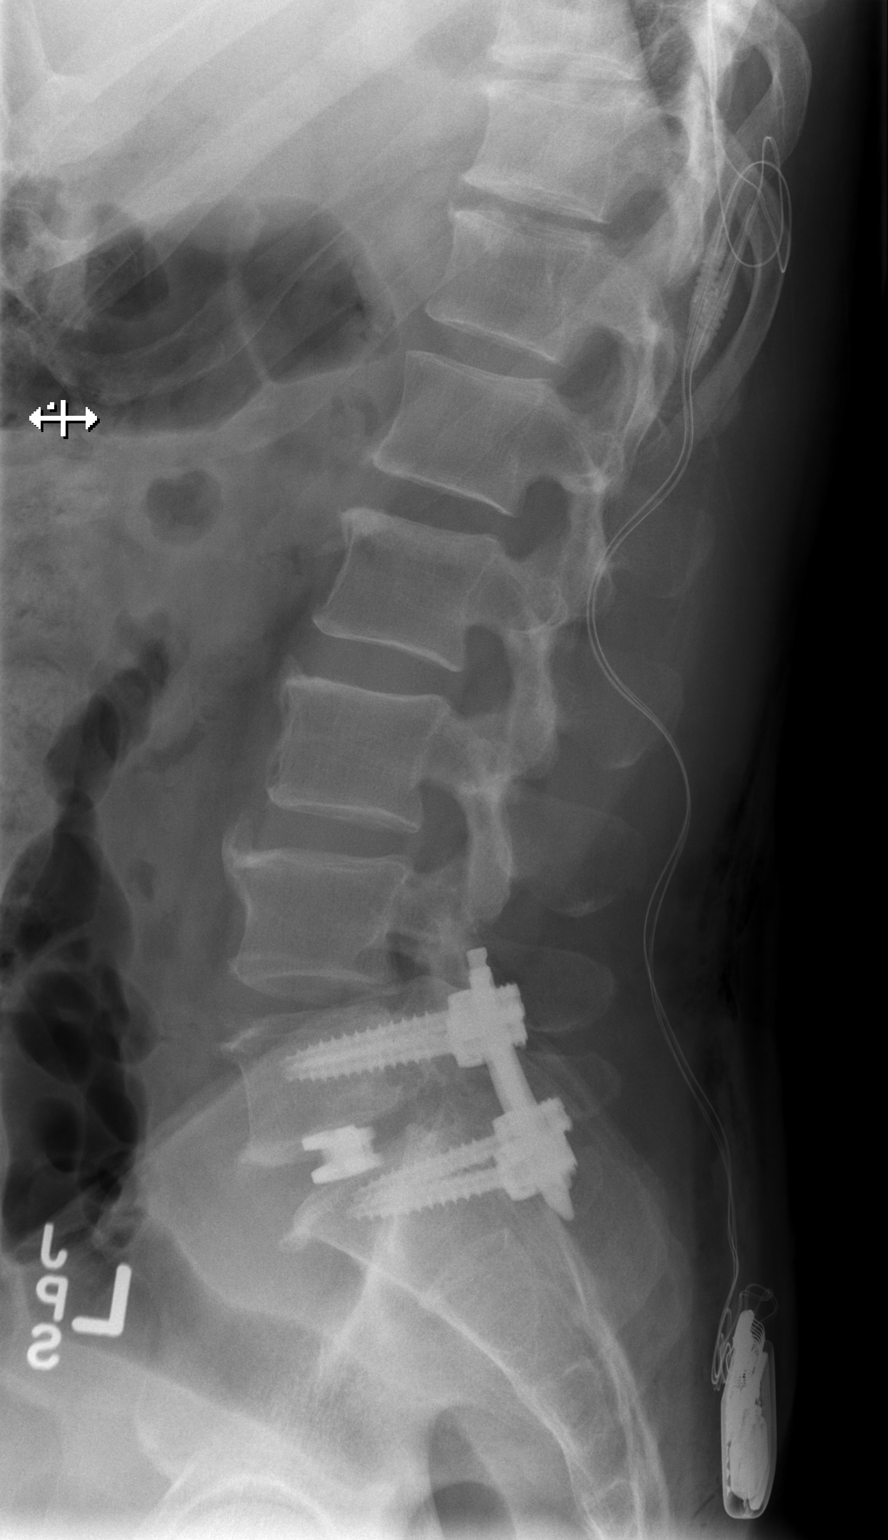

[t lumbar spine ap]
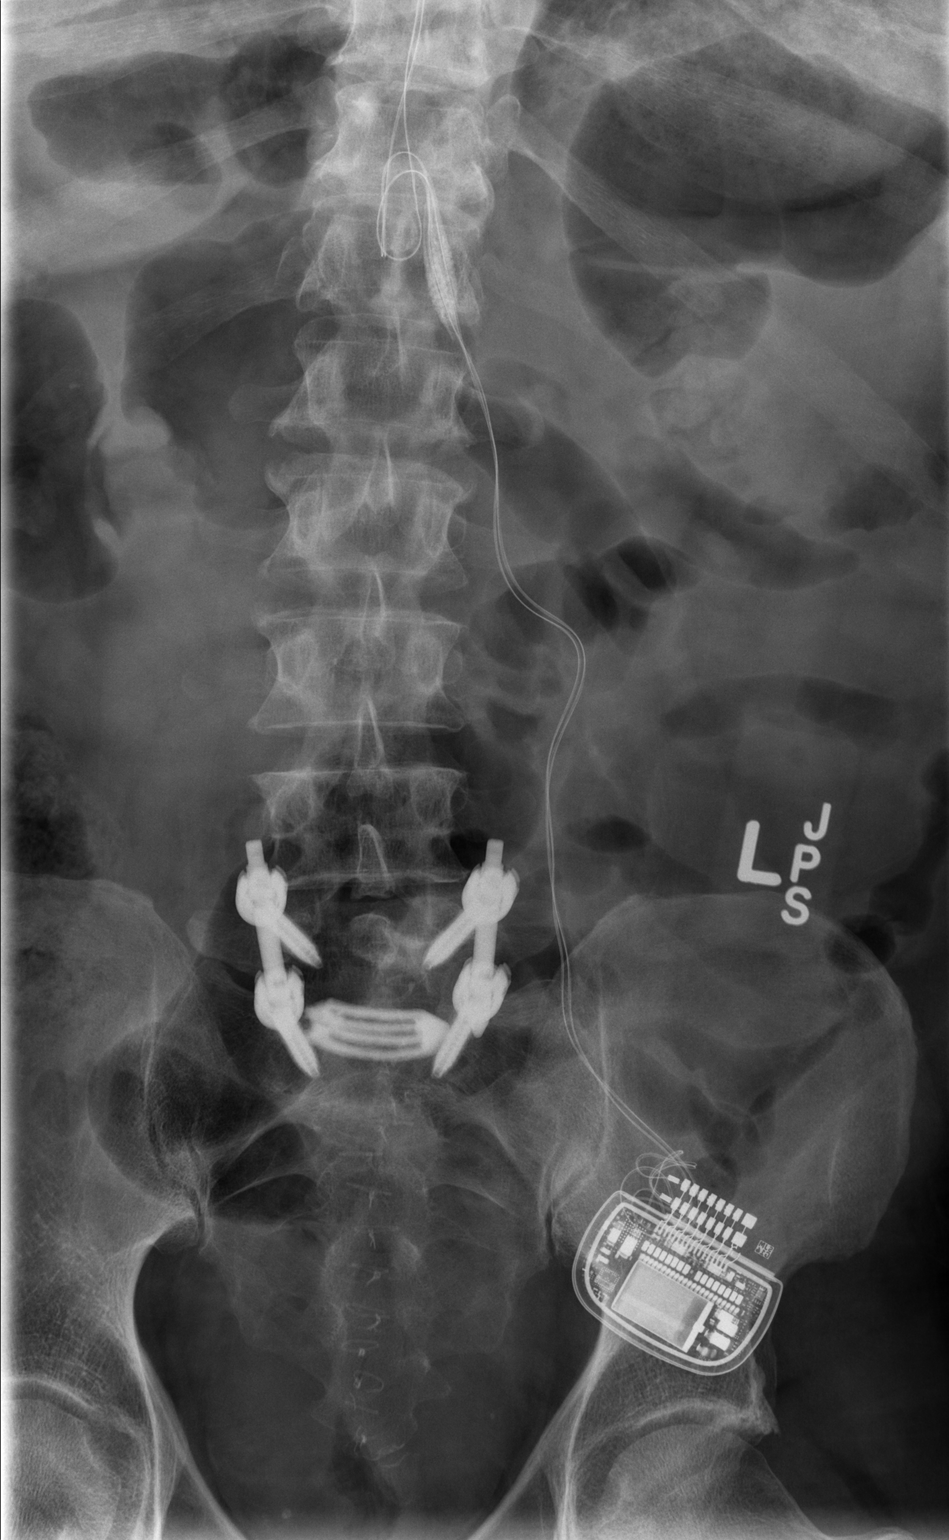

[2 of 2 positions shown; findings below may reference images not displayed]

FINDINGS: Frontal and lateral views were obtained. There is screw and plate
fixation from a posterior approach at L5 and S1. There is a disc
spacer at L5-S1. Fixation devices and the disc spacer appear intact.
There is no fracture or spondylolisthesis. There is moderate disc
space narrowing at L4-5. There are prominent anterior osteophytes at
L3, L4, and L5. There is a stimulator overlying the posterior
inferior left pelvis with tips extending into the thoracic region.
The tips of the stimulator are not seen on this study. There is no
erosive change or bony destruction.
IMPRESSION: Postoperative change at L5 and S1 with support hardware and disc
spacer intact. Osteoarthritic change at L4-5. No fracture or
spondylolisthesis. Stimulator present on left.

## 2016-02-19 DIAGNOSIS — Z9889 Other specified postprocedural states: Secondary | ICD-10-CM | POA: Diagnosis not present

## 2016-02-19 DIAGNOSIS — M5441 Lumbago with sciatica, right side: Secondary | ICD-10-CM | POA: Diagnosis not present

## 2016-02-22 ENCOUNTER — Other Ambulatory Visit: Payer: Self-pay | Admitting: Orthopedic Surgery

## 2016-02-22 DIAGNOSIS — Z9889 Other specified postprocedural states: Secondary | ICD-10-CM

## 2016-03-11 ENCOUNTER — Ambulatory Visit
Admission: RE | Admit: 2016-03-11 | Discharge: 2016-03-11 | Disposition: A | Discharge status: Home / Self Care | Payer: Commercial Managed Care - HMO | Source: Ambulatory Visit | Attending: Orthopedic Surgery | Admitting: Orthopedic Surgery

## 2016-03-11 VITALS — BP 127/76 | HR 86

## 2016-03-11 DIAGNOSIS — Z9889 Other specified postprocedural states: Secondary | ICD-10-CM | POA: Diagnosis not present

## 2016-03-11 DIAGNOSIS — G894 Chronic pain syndrome: Secondary | ICD-10-CM

## 2016-03-11 DIAGNOSIS — M47817 Spondylosis without myelopathy or radiculopathy, lumbosacral region: Secondary | ICD-10-CM

## 2016-03-11 DIAGNOSIS — M545 Low back pain: Secondary | ICD-10-CM | POA: Diagnosis not present

## 2016-03-11 MED ORDER — DIAZEPAM 5 MG PO TABS
10.0000 mg | ORAL_TABLET | Freq: Once | ORAL | Status: AC
  Administered 2016-03-11: 10 mg via ORAL

## 2016-03-11 MED ORDER — IOPAMIDOL (ISOVUE-M 200) INJECTION 41%
15.0000 mL | Freq: Once | INTRAMUSCULAR | Status: AC
  Administered 2016-03-11: 15 mL via INTRATHECAL

## 2016-03-11 NOTE — Discharge Instructions (Signed)
Myelogram Discharge Instructions  1. Go home and rest quietly for the next 24 hours.  It is important to lie flat for the next 24 hours.  Get up only to go to the restroom.  You may lie in the bed or on a couch on your back, your stomach, your left side or your right side.  You may have one pillow under your head.  You may have pillows between your knees while you are on your side or under your knees while you are on your back.  2. DO NOT drive today.  Recline the seat as far back as it will go, while still wearing your seat belt, on the way home.  3. You may get up to go to the bathroom as needed.  You may sit up for 10 minutes to eat.  You may resume your normal diet and medications unless otherwise indicated.  Drink lots of extra fluids today and tomorrow.  4. The incidence of headache, nausea, or vomiting is about 5% (one in 20 patients).  If you develop a headache, lie flat and drink plenty of fluids until the headache goes away.  Caffeinated beverages may be helpful.  If you develop severe nausea and vomiting or a headache that does not go away with flat bed rest, call [phone].  5. You may resume normal activities after your 24 hours of bed rest is over; however, do not exert yourself strongly or do any heavy lifting tomorrow. If when you get up you have a headache when standing, go back to bed and force fluids for another 24 hours.  6. Call your physician for a follow-up appointment.  The results of your myelogram will be sent directly to your physician by the following day.  7. If you have any questions or if complications develop after you arrive home, please call [phone].  Discharge instructions have been explained to the patient.  The patient, or the person responsible for the patient, fully understands these instructions.       May resume Tofranil on Dec/ 19, 2017, after 9:30 am.

## 2016-03-11 NOTE — Progress Notes (Signed)
Pt has been off Tofranil for the past several days.

## 2016-03-14 ENCOUNTER — Telehealth: Payer: Self-pay | Admitting: Radiology

## 2016-03-14 NOTE — Telephone Encounter (Signed)
Denies headache, c/o some soreness.

## 2016-03-26 DIAGNOSIS — Z9889 Other specified postprocedural states: Secondary | ICD-10-CM | POA: Diagnosis not present

## 2016-04-11 ENCOUNTER — Encounter: Payer: Commercial Managed Care - HMO | Admitting: Internal Medicine

## 2016-04-25 ENCOUNTER — Ambulatory Visit (INDEPENDENT_AMBULATORY_CARE_PROVIDER_SITE_OTHER): Payer: Medicare HMO | Admitting: Internal Medicine

## 2016-04-25 ENCOUNTER — Encounter: Payer: Self-pay | Admitting: Internal Medicine

## 2016-04-25 ENCOUNTER — Other Ambulatory Visit (INDEPENDENT_AMBULATORY_CARE_PROVIDER_SITE_OTHER): Payer: Medicare HMO

## 2016-04-25 VITALS — BP 134/74 | HR 95 | Temp 97.7°F | Resp 20 | Wt 192.5 lb

## 2016-04-25 DIAGNOSIS — Z0000 Encounter for general adult medical examination without abnormal findings: Secondary | ICD-10-CM | POA: Diagnosis not present

## 2016-04-25 DIAGNOSIS — R7302 Impaired glucose tolerance (oral): Secondary | ICD-10-CM

## 2016-04-25 LAB — CBC WITH DIFFERENTIAL/PLATELET
BASOS ABS: 0.1 10*3/uL (ref 0.0–0.1)
BASOS PCT: 1 % (ref 0.0–3.0)
EOS ABS: 0.3 10*3/uL (ref 0.0–0.7)
Eosinophils Relative: 4.1 % (ref 0.0–5.0)
HCT: 45.7 % (ref 39.0–52.0)
Hemoglobin: 15.9 g/dL (ref 13.0–17.0)
Lymphocytes Relative: 35.7 % (ref 12.0–46.0)
Lymphs Abs: 2.2 10*3/uL (ref 0.7–4.0)
MCHC: 34.9 g/dL (ref 30.0–36.0)
MCV: 87.6 fl (ref 78.0–100.0)
MONO ABS: 0.6 10*3/uL (ref 0.1–1.0)
Monocytes Relative: 9.9 % (ref 3.0–12.0)
NEUTROS ABS: 3.1 10*3/uL (ref 1.4–7.7)
NEUTROS PCT: 49.3 % (ref 43.0–77.0)
PLATELETS: 232 10*3/uL (ref 150.0–400.0)
RBC: 5.22 Mil/uL (ref 4.22–5.81)
RDW: 13.1 % (ref 11.5–15.5)
WBC: 6.3 10*3/uL (ref 4.0–10.5)

## 2016-04-25 LAB — HEPATIC FUNCTION PANEL
ALT: 66 U/L — AB (ref 0–53)
AST: 24 U/L (ref 0–37)
Albumin: 4.7 g/dL (ref 3.5–5.2)
Alkaline Phosphatase: 107 U/L (ref 39–117)
BILIRUBIN DIRECT: 0.2 mg/dL (ref 0.0–0.3)
BILIRUBIN TOTAL: 1 mg/dL (ref 0.2–1.2)
TOTAL PROTEIN: 7.4 g/dL (ref 6.0–8.3)

## 2016-04-25 LAB — LIPID PANEL
Cholesterol: 158 mg/dL (ref 0–200)
HDL: 40.2 mg/dL (ref >39.00)
LDL Cholesterol: 95 mg/dL (ref 0–99)
NONHDL: 117.53
TRIGLYCERIDES: 114 mg/dL (ref 0.0–149.0)
Total CHOL/HDL Ratio: 4
VLDL: 22.8 mg/dL (ref 0.0–40.0)

## 2016-04-25 LAB — URINALYSIS, ROUTINE W REFLEX MICROSCOPIC
Bilirubin Urine: NEGATIVE
Hgb urine dipstick: NEGATIVE
Ketones, ur: NEGATIVE
Leukocytes, UA: NEGATIVE
Nitrite: NEGATIVE
PH: 6 (ref 5.0–8.0)
RBC / HPF: NONE SEEN (ref 0–?)
TOTAL PROTEIN, URINE-UPE24: NEGATIVE
URINE GLUCOSE: NEGATIVE
Urobilinogen, UA: 0.2 (ref 0.0–1.0)

## 2016-04-25 LAB — PSA: PSA: 1.34 ng/mL (ref 0.10–4.00)

## 2016-04-25 LAB — BASIC METABOLIC PANEL
BUN: 15 mg/dL (ref 6–23)
CHLORIDE: 101 meq/L (ref 96–112)
CO2: 32 meq/L (ref 19–32)
Calcium: 9.7 mg/dL (ref 8.4–10.5)
Creatinine, Ser: 1.31 mg/dL (ref 0.40–1.50)
GFR: 59.49 mL/min — ABNORMAL LOW (ref >60.00)
Glucose, Bld: 82 mg/dL (ref 70–99)
POTASSIUM: 4.4 meq/L (ref 3.5–5.1)
Sodium: 139 mEq/L (ref 135–145)

## 2016-04-25 LAB — TSH: TSH: 1.05 u[IU]/mL (ref 0.35–4.50)

## 2016-04-25 LAB — HEMOGLOBIN A1C: HEMOGLOBIN A1C: 5.5 % (ref 4.6–6.5)

## 2016-04-25 NOTE — Patient Instructions (Signed)

## 2016-04-25 NOTE — Progress Notes (Signed)
Pre visit review using our clinic review tool, if applicable. No additional management support is needed unless otherwise documented below in the visit note. 

## 2016-04-26 ENCOUNTER — Encounter: Payer: Self-pay | Admitting: Internal Medicine

## 2016-04-26 NOTE — Progress Notes (Signed)
Subjective:    Patient ID: [name], male    DOB: 06/14/1955, 61 y.o.   MRN: [id number]  HPI  Here for wellness and f/u;  Overall doing ok;  Pt denies Chest pain, worsening SOB, DOE, wheezing, orthopnea, PND, worsening LE edema, palpitations, dizziness or syncope.  Pt denies neurological change such as new headache, facial or extremity weakness.  Pt denies polydipsia, polyuria, or low sugar symptoms. Pt states overall good compliance with treatment and medications, good tolerability, and has been trying to follow appropriate diet.  Pt denies worsening depressive symptoms, suicidal ideation or panic. No fever, night sweats, wt loss, loss of appetite, or other constitutional symptoms.  Pt states good ability with ADL's, has low fall risk, home safety reviewed and adequate, no other significant changes in hearing or vision, and only occasionally active with exercise. No other new hx Past Medical History:  Diagnosis Date  . ANEMIA, PROTEIN-DEFICIENCY 09/29/2006  . ANXIETY 09/29/2006  . BACK PAIN 05/12/2007  . BRONCHITIS, ACUTE 02/27/2009  . DEPRESSION 09/29/2006  . DISC DISEASE, CERVICAL 02/01/2007  . FIBROMYALGIA 02/01/2007  . GERD 09/29/2006  . Hypertension   . HYPERTENSION 09/29/2006  . Impaired glucose tolerance 04/02/2011  . INSOMNIA-SLEEP DISORDER-UNSPEC 03/07/2009  . NECK PAIN, LEFT 03/07/2009  . Osteoporosis   . PAIN, CHRONIC NEC 09/29/2006  . PEPTIC ULCER DISEASE 02/01/2007  . Personal history of venous thrombosis and embolism 02/01/2007  . PROTEIN C DEFICIENCY 09/29/2006  . RASH-NONVESICULAR 05/12/2007  . ROTATOR CUFF SYNDROME, RIGHT 02/01/2007  . SEIZURE DISORDER 09/29/2006  . Seizures (HCC)   . SLEEP APNEA, OBSTRUCTIVE, MODERATE 09/29/2006   was told CPAP would not help after he had an accident 2006  . SPONDYLOSIS, LUMBAR 02/01/2007   Past Surgical History:  Procedure Laterality Date  . APPENDECTOMY  age 61  . electrical stimulator in back    . FACIAL RECONSTRUCTION SURGERY  2006  . KNEE  ARTHROSCOPY  04/2010   right  . LUMBAR FUSION  06/29/2014   L5 - S1    STIMULATOR INSERTION  . SPINAL CORD STIMULATOR BATTERY EXCHANGE N/A 06/29/2014   Procedure: SPINAL CORD STIMULATOR BATTERY EXCHANGE;  Surgeon: Venita Lickahari Brooks, MD;  Location: [facility] OR;  Service: Orthopedics;  Laterality: N/A;  . stimulator revision  2012    reports that he has never smoked. He has never used smokeless tobacco. He reports that he does not drink alcohol or use drugs. family history includes Hypertension in his other. Allergies  Allergen Reactions  . Topamax [Topiramate] Hives  . Topical Sulfur Rash   Current Outpatient Prescriptions on File Prior to Visit  Medication Sig Dispense Refill  . amLODipine (NORVASC) 5 MG tablet Take 1 tablet (5 mg total) by mouth daily. 90 tablet 3  . aspirin EC 81 MG tablet Take 1 tablet (81 mg total) by mouth daily. 90 tablet 11  . atorvastatin (LIPITOR) 10 MG tablet Take 1 tablet (10 mg total) by mouth daily. 90 tablet 3  . HYDROmorphone (DILAUDID) 4 MG tablet Take 4 mg by mouth 3 (three) times daily as needed. For pain  0  . imipramine (TOFRANIL) 50 MG tablet Take 2 tablets (100 mg total) by mouth at bedtime. 180 tablet 1  . lamoTRIgine (LAMICTAL) 200 MG tablet Take 1 tablet (200 mg total) by mouth daily. 90 tablet 3  . omeprazole (PRILOSEC) 40 MG capsule Take 1 capsule (40 mg total) by mouth daily. 90 capsule 3   No current facility-administered medications on file prior to  visit.    Review of Systems Constitutional: Negative for increased diaphoresis, or other activity, appetite or siginficant weight change other than noted HENT: Negative for worsening hearing loss, ear pain, facial swelling, mouth sores and neck stiffness.   Eyes: Negative for other worsening pain, redness or visual disturbance.  Respiratory: Negative for choking or stridor Cardiovascular: Negative for other chest pain and palpitations.  Gastrointestinal: Negative for worsening diarrhea, blood in stool, or  abdominal distention Genitourinary: Negative for hematuria, flank pain or change in urine volume.  Musculoskeletal: Negative for myalgias or other joint complaints.  Skin: Negative for other color change and wound or drainage.  Neurological: Negative for syncope and numbness. other than noted Hematological: Negative for adenopathy. or other swelling Psychiatric/Behavioral: Negative for hallucinations, SI, self-injury, decreased concentration or other worsening agitation.  All other system neg per pt    Objective:   Physical Exam BP 134/74   Pulse 95   Temp 97.7 F (36.5 C) (Oral)   Resp 20   Wt 192 lb 8 oz (87.3 kg)   SpO2 93%   BMI 30.15 kg/m  VS noted,  Constitutional: Pt is oriented to person, place, and time. Appears well-developed and well-nourished, in no significant distress Head: Normocephalic and atraumatic  Eyes: Conjunctivae and EOM are normal. Pupils are equal, round, and reactive to light Right Ear: External ear normal.  Left Ear: External ear normal Nose: Nose normal.  Mouth/Throat: Oropharynx is clear and moist  Neck: Normal range of motion. Neck supple. No JVD present. No tracheal deviation present or significant neck LA or mass Cardiovascular: Normal rate, regular rhythm, normal heart sounds and intact distal pulses.   Pulmonary/Chest: Effort normal and breath sounds without rales or wheezing  Abdominal: Soft. Bowel sounds are normal. NT. No HSM  Musculoskeletal: Normal range of motion. Exhibits no edema Lymphadenopathy: Has no cervical adenopathy.  Neurological: Pt is alert and oriented to person, place, and time. Pt has normal reflexes. No cranial nerve deficit. Motor grossly intact Skin: Skin is warm and dry. No rash noted or new ulcers Psychiatric:  Has normal mood and affect. Behavior is normal.  No other new exam findings    Assessment & Plan:

## 2016-04-26 NOTE — Assessment & Plan Note (Signed)
stable overall by history and exam, recent data reviewed with pt, and pt to continue medical treatment as before,  to f/u any worsening symptoms or concerns Lab Results  Component Value Date   HGBA1C 5.5 04/25/2016

## 2016-04-26 NOTE — Assessment & Plan Note (Signed)

## 2016-05-03 DIAGNOSIS — M961 Postlaminectomy syndrome, not elsewhere classified: Secondary | ICD-10-CM | POA: Diagnosis not present

## 2016-05-03 DIAGNOSIS — M545 Low back pain: Secondary | ICD-10-CM | POA: Diagnosis not present

## 2016-05-03 DIAGNOSIS — G894 Chronic pain syndrome: Secondary | ICD-10-CM | POA: Diagnosis not present

## 2016-05-03 DIAGNOSIS — M1711 Unilateral primary osteoarthritis, right knee: Secondary | ICD-10-CM | POA: Diagnosis not present

## 2016-05-07 DIAGNOSIS — M5441 Lumbago with sciatica, right side: Secondary | ICD-10-CM | POA: Diagnosis not present

## 2016-05-07 DIAGNOSIS — Z9889 Other specified postprocedural states: Secondary | ICD-10-CM | POA: Diagnosis not present

## 2016-07-29 DIAGNOSIS — M545 Low back pain: Secondary | ICD-10-CM | POA: Diagnosis not present

## 2016-07-29 DIAGNOSIS — M1711 Unilateral primary osteoarthritis, right knee: Secondary | ICD-10-CM | POA: Diagnosis not present

## 2016-07-29 DIAGNOSIS — Z5181 Encounter for therapeutic drug level monitoring: Secondary | ICD-10-CM | POA: Diagnosis not present

## 2016-07-29 DIAGNOSIS — Z79899 Other long term (current) drug therapy: Secondary | ICD-10-CM | POA: Diagnosis not present

## 2016-07-29 DIAGNOSIS — Z9689 Presence of other specified functional implants: Secondary | ICD-10-CM | POA: Diagnosis not present

## 2016-07-29 DIAGNOSIS — M961 Postlaminectomy syndrome, not elsewhere classified: Secondary | ICD-10-CM | POA: Diagnosis not present

## 2016-09-23 ENCOUNTER — Telehealth: Payer: Self-pay | Admitting: Internal Medicine

## 2016-09-23 NOTE — Telephone Encounter (Signed)
Pt called in has some questions about meds that Dr Jonny RuizJohn gave him.  Ins is not coving some of them and would like a nurse to call him about other options

## 2016-09-23 NOTE — Telephone Encounter (Signed)
Pt was taking imipramine (TOFRANIL) 50 MG but pt cant not afford that medication anymore and has weined himself off. He would like to know what are some alternative options because he is having a hard tie sleeping and its starting to disturb his wifes sleep also. Please advise.

## 2016-09-23 NOTE — Telephone Encounter (Signed)
We could consider ambien or lunesta if that is OK with him and he is not allergic to this

## 2016-09-24 MED ORDER — ZOLPIDEM TARTRATE 10 MG PO TABS: 10.0000 mg | ORAL_TABLET | Freq: Every evening | ORAL | 2 refills | Status: DC | PRN

## 2016-09-24 NOTE — Telephone Encounter (Signed)
Faxed

## 2016-09-24 NOTE — Telephone Encounter (Signed)
Ok for [employer]ardambien - CIGNADone hardcopy to [employer]Shirron

## 2016-09-24 NOTE — Telephone Encounter (Signed)
Spoke with pt, he is not allergic to either medication. He would like [facility] [place] [facility] [place].

## 2016-10-17 ENCOUNTER — Ambulatory Visit (INDEPENDENT_AMBULATORY_CARE_PROVIDER_SITE_OTHER): Payer: Medicare HMO | Admitting: Internal Medicine

## 2016-10-17 ENCOUNTER — Other Ambulatory Visit: Payer: Self-pay | Admitting: Internal Medicine

## 2016-10-17 ENCOUNTER — Encounter: Payer: Self-pay | Admitting: Internal Medicine

## 2016-10-17 VITALS — BP 132/88 | HR 81 | Ht 67.0 in | Wt 199.0 lb

## 2016-10-17 DIAGNOSIS — G4700 Insomnia, unspecified: Secondary | ICD-10-CM | POA: Diagnosis not present

## 2016-10-17 DIAGNOSIS — M79605 Pain in left leg: Secondary | ICD-10-CM | POA: Diagnosis not present

## 2016-10-17 DIAGNOSIS — M79604 Pain in right leg: Secondary | ICD-10-CM | POA: Diagnosis not present

## 2016-10-17 DIAGNOSIS — R7302 Impaired glucose tolerance (oral): Secondary | ICD-10-CM

## 2016-10-17 DIAGNOSIS — Z0000 Encounter for general adult medical examination without abnormal findings: Secondary | ICD-10-CM

## 2016-10-17 MED ORDER — AMITRIPTYLINE HCL 150 MG PO TABS: 150.0000 mg | ORAL_TABLET | Freq: Every evening | ORAL | 0 refills | Status: DC | PRN

## 2016-10-17 MED ORDER — AMITRIPTYLINE HCL 150 MG PO TABS: 150.0000 mg | ORAL_TABLET | Freq: Every evening | ORAL | 1 refills | Status: DC | PRN

## 2016-10-17 MED ORDER — AMITRIPTYLINE HCL 150 MG PO TABS
150.0000 mg | ORAL_TABLET | Freq: Every evening | ORAL | 0 refills | Status: DC | PRN
Start: 2016-10-17 — End: 2017-01-28

## 2016-10-17 NOTE — Progress Notes (Signed)
Subjective:    Patient ID: [name], male    DOB: 06/17/1955, 61 y.o.   MRN: [id number]  HPI  Here to f/u, c/o lack of sleep more than 2 hrs at a time for the last wk, due to leg pain below knees both legs.  Pt denies chest pain, increased sob or doe, wheezing, orthopnea, PND, increased LE swelling, palpitations, dizziness or syncope.  Pt denies new neurological symptoms such as new headache, or facial or extremity weakness or numbness   Pt denies polydipsia, polyuria,  Ambien no help with this pain, nothing else makes better or worse Past Medical History:  Diagnosis Date  . ANEMIA, PROTEIN-DEFICIENCY 09/29/2006  . ANXIETY 09/29/2006  . BACK PAIN 05/12/2007  . BRONCHITIS, ACUTE 02/27/2009  . DEPRESSION 09/29/2006  . DISC DISEASE, CERVICAL 02/01/2007  . FIBROMYALGIA 02/01/2007  . GERD 09/29/2006  . Hypertension   . HYPERTENSION 09/29/2006  . Impaired glucose tolerance 04/02/2011  . INSOMNIA-SLEEP DISORDER-UNSPEC 03/07/2009  . NECK PAIN, LEFT 03/07/2009  . Osteoporosis   . PAIN, CHRONIC NEC 09/29/2006  . PEPTIC ULCER DISEASE 02/01/2007  . Personal history of venous thrombosis and embolism 02/01/2007  . PROTEIN C DEFICIENCY 09/29/2006  . RASH-NONVESICULAR 05/12/2007  . ROTATOR CUFF SYNDROME, RIGHT 02/01/2007  . SEIZURE DISORDER 09/29/2006  . Seizures (HCC)   . SLEEP APNEA, OBSTRUCTIVE, MODERATE 09/29/2006   was told CPAP would not help after he had an accident 2006  . SPONDYLOSIS, LUMBAR 02/01/2007   Past Surgical History:  Procedure Laterality Date  . APPENDECTOMY  age 61  . electrical stimulator in back    . FACIAL RECONSTRUCTION SURGERY  2006  . KNEE ARTHROSCOPY  04/2010   right  . LUMBAR FUSION  06/29/2014   L5 - S1    STIMULATOR INSERTION  . SPINAL CORD STIMULATOR BATTERY EXCHANGE N/A 06/29/2014   Procedure: SPINAL CORD STIMULATOR BATTERY EXCHANGE;  Surgeon: Venita Lickahari Brooks, MD;  Location: [facility] OR;  Service: Orthopedics;  Laterality: N/A;  . stimulator revision  2012    reports that he has  never smoked. He has never used smokeless tobacco. He reports that he does not drink alcohol or use drugs. family history includes Hypertension in his other. Allergies  Allergen Reactions  . Topamax [Topiramate] Hives  . Topical Sulfur Rash   Current Outpatient Prescriptions on File Prior to Visit  Medication Sig Dispense Refill  . amLODipine (NORVASC) 5 MG tablet Take 1 tablet (5 mg total) by mouth daily. 90 tablet 3  . aspirin EC 81 MG tablet Take 1 tablet (81 mg total) by mouth daily. 90 tablet 11  . atorvastatin (LIPITOR) 10 MG tablet Take 1 tablet (10 mg total) by mouth daily. 90 tablet 3  . HYDROmorphone (DILAUDID) 4 MG tablet Take 4 mg by mouth 3 (three) times daily as needed. For pain  0  . lamoTRIgine (LAMICTAL) 200 MG tablet Take 1 tablet (200 mg total) by mouth daily. 90 tablet 3  . omeprazole (PRILOSEC) 40 MG capsule Take 1 capsule (40 mg total) by mouth daily. 90 capsule 3   No current facility-administered medications on file prior to visit.    Review of Systems  Constitutional: Negative for other unusual diaphoresis or sweats HENT: Negative for ear discharge or swelling Eyes: Negative for other worsening visual disturbances Respiratory: Negative for stridor or other swelling  Gastrointestinal: Negative for worsening distension or other blood Genitourinary: Negative for retention or other urinary change Musculoskeletal: Negative for other MSK pain or swelling Skin: Negative  for color change or other new lesions Neurological: Negative for worsening tremors and other numbness  Psychiatric/Behavioral: Negative for worsening agitation or other fatigue All other system neg per pt    Objective:   Physical Exam BP 132/88   Pulse 81   Ht 5\' 7"  (1.702 m)   Wt 199 lb (90.3 kg)   SpO2 99%   BMI 31.17 kg/m  VS noted,  Constitutional: Pt appears in NAD HENT: Head: NCAT.  Right Ear: External ear normal.  Left Ear: External ear normal.  Eyes: . Pupils are equal, round, and  reactive to light. Conjunctivae and EOM are normal Nose: without d/c or deformity Neck: Neck supple. Gross normal ROM Cardiovascular: Normal rate and regular rhythm.   Pulmonary/Chest: Effort normal and breath sounds without rales or wheezing.  Abd:  Soft, NT, ND, + BS, no organomegaly Neurological: Pt is alert. At baseline orientation, motor grossly intact, has decreased sens to LT to bilat LE below knees Skin: Skin is warm. No rashes, other new lesions, no LE edema Psychiatric: Pt behavior is normal without agitation  No other exam findings Lab Results  Component Value Date   WBC 6.3 04/25/2016   HGB 15.9 04/25/2016   HCT 45.7 04/25/2016   PLT 232.0 04/25/2016   GLUCOSE 82 04/25/2016   CHOL 158 04/25/2016   TRIG 114.0 04/25/2016   HDL 40.20 04/25/2016   LDLDIRECT 101.0 04/06/2014   LDLCALC 95 04/25/2016   ALT 66 (H) 04/25/2016   AST 24 04/25/2016   NA 139 04/25/2016   K 4.4 04/25/2016   CL 101 04/25/2016   CREATININE 1.31 04/25/2016   BUN 15 04/25/2016   CO2 32 04/25/2016   TSH 1.05 04/25/2016   PSA 1.34 04/25/2016   INR 1.0 02/03/2008   HGBA1C 5.5 04/25/2016       Assessment & Plan:

## 2016-10-17 NOTE — Patient Instructions (Signed)
OK to stop the ambien  Please take all new medication as prescribed - the elavil (amitryptilene)  Please continue all other medications as before, and refills have been done if requested.  Please have the pharmacy call with any other refills you may need.  Please keep your appointments with your specialists as you may have planned  Please return in 6 months, or sooner if needed, with Lab testing done 3-5 days before

## 2016-10-17 NOTE — Telephone Encounter (Signed)
[facility] 

## 2016-10-20 DIAGNOSIS — M79604 Pain in right leg: Secondary | ICD-10-CM | POA: Insufficient documentation

## 2016-10-20 DIAGNOSIS — M79605 Pain in left leg: Secondary | ICD-10-CM

## 2016-10-20 NOTE — Assessment & Plan Note (Signed)
Lab Results  Component Value Date   HGBA1C 5.5 04/25/2016  stable overall by history and exam, recent data reviewed with pt, and pt to continue medical treatment as before,  to f/u any worsening symptoms or concerns

## 2016-10-20 NOTE — Assessment & Plan Note (Signed)
Exam c/w neuritic pain, likely peripheral neuropathy, for elavil 150 qhs prn, to f/u any worsening symptoms or concerns

## 2016-10-20 NOTE — Assessment & Plan Note (Signed)
Also for elavil asd,  to f/u any worsening symptoms or concerns

## 2016-10-21 ENCOUNTER — Other Ambulatory Visit: Payer: Self-pay | Admitting: Internal Medicine

## 2016-10-23 ENCOUNTER — Ambulatory Visit: Payer: Medicare HMO | Admitting: Internal Medicine

## 2016-10-28 DIAGNOSIS — M1711 Unilateral primary osteoarthritis, right knee: Secondary | ICD-10-CM | POA: Diagnosis not present

## 2016-10-28 DIAGNOSIS — M545 Low back pain: Secondary | ICD-10-CM | POA: Diagnosis not present

## 2016-10-28 DIAGNOSIS — M961 Postlaminectomy syndrome, not elsewhere classified: Secondary | ICD-10-CM | POA: Diagnosis not present

## 2016-10-28 DIAGNOSIS — Z9689 Presence of other specified functional implants: Secondary | ICD-10-CM | POA: Diagnosis not present

## 2016-11-14 ENCOUNTER — Other Ambulatory Visit: Payer: Self-pay | Admitting: Internal Medicine

## 2016-12-18 ENCOUNTER — Other Ambulatory Visit: Payer: Self-pay

## 2016-12-18 MED ORDER — AMLODIPINE BESYLATE 5 MG PO TABS: 5.0000 mg | ORAL_TABLET | Freq: Every day | ORAL | 3 refills | Status: DC

## 2017-01-06 DIAGNOSIS — M1711 Unilateral primary osteoarthritis, right knee: Secondary | ICD-10-CM | POA: Diagnosis not present

## 2017-01-06 DIAGNOSIS — M545 Low back pain: Secondary | ICD-10-CM | POA: Diagnosis not present

## 2017-01-06 DIAGNOSIS — Z9689 Presence of other specified functional implants: Secondary | ICD-10-CM | POA: Diagnosis not present

## 2017-01-06 DIAGNOSIS — G894 Chronic pain syndrome: Secondary | ICD-10-CM | POA: Diagnosis not present

## 2017-01-28 ENCOUNTER — Encounter: Payer: Self-pay | Admitting: Internal Medicine

## 2017-01-28 ENCOUNTER — Ambulatory Visit: Payer: Medicare HMO | Admitting: Internal Medicine

## 2017-01-28 VITALS — BP 130/88 | HR 96 | Temp 98.3°F | Ht 67.0 in | Wt 201.0 lb

## 2017-01-28 DIAGNOSIS — J45901 Unspecified asthma with (acute) exacerbation: Secondary | ICD-10-CM | POA: Insufficient documentation

## 2017-01-28 DIAGNOSIS — R059 Cough, unspecified: Secondary | ICD-10-CM | POA: Insufficient documentation

## 2017-01-28 DIAGNOSIS — R05 Cough: Secondary | ICD-10-CM

## 2017-01-28 DIAGNOSIS — R062 Wheezing: Secondary | ICD-10-CM

## 2017-01-28 DIAGNOSIS — R7302 Impaired glucose tolerance (oral): Secondary | ICD-10-CM | POA: Diagnosis not present

## 2017-01-28 MED ORDER — OMEPRAZOLE 40 MG PO CPDR: 40.0000 mg | DELAYED_RELEASE_CAPSULE | Freq: Every day | ORAL | 3 refills | Status: DC

## 2017-01-28 MED ORDER — LAMOTRIGINE 200 MG PO TABS: 200.0000 mg | ORAL_TABLET | Freq: Every day | ORAL | 3 refills | Status: DC

## 2017-01-28 MED ORDER — PREDNISONE 10 MG PO TABS: ORAL_TABLET | ORAL | 0 refills | Status: DC

## 2017-01-28 MED ORDER — LEVOFLOXACIN 500 MG PO TABS: 500.0000 mg | ORAL_TABLET | Freq: Every day | ORAL | 0 refills | Status: AC

## 2017-01-28 MED ORDER — AMLODIPINE BESYLATE 5 MG PO TABS: 5.0000 mg | ORAL_TABLET | Freq: Every day | ORAL | 3 refills | Status: DC

## 2017-01-28 MED ORDER — AMITRIPTYLINE HCL 150 MG PO TABS: 150.0000 mg | ORAL_TABLET | Freq: Every evening | ORAL | 1 refills | Status: DC | PRN

## 2017-01-28 MED ORDER — ATORVASTATIN CALCIUM 10 MG PO TABS: 10.0000 mg | ORAL_TABLET | Freq: Every day | ORAL | 3 refills | Status: DC

## 2017-01-28 MED ORDER — METHYLPREDNISOLONE ACETATE 80 MG/ML IJ SUSP
80.0000 mg | Freq: Once | INTRAMUSCULAR | Status: AC
  Administered 2017-01-28: 80 mg via INTRAMUSCULAR

## 2017-01-28 MED ORDER — HYDROCODONE-HOMATROPINE 5-1.5 MG/5ML PO SYRP: 5.0000 mL | ORAL_SOLUTION | Freq: Four times a day (QID) | ORAL | 0 refills | Status: AC | PRN

## 2017-01-28 NOTE — Progress Notes (Signed)
Subjective:    Patient ID: [name], male    DOB: 05/08/1955, 61 y.o.   MRN: [id number]  HPI  Here with acute onset mild to mod 3 wks ST, HA, general weakness and malaise, with prod cough greenish sputum, but Pt denies chest pain, increased sob or doe, wheezing, orthopnea, PND, increased LE swelling, palpitations, dizziness or syncope, except for onset mild wheezing last PM with mild sob  Pt denies new neurological symptoms such as new headache, or facial or extremity weakness or numbness   Pt denies polydipsia, polyuria Past Medical History:  Diagnosis Date  . ANEMIA, PROTEIN-DEFICIENCY 09/29/2006  . ANXIETY 09/29/2006  . BACK PAIN 05/12/2007  . BRONCHITIS, ACUTE 02/27/2009  . DEPRESSION 09/29/2006  . DISC DISEASE, CERVICAL 02/01/2007  . FIBROMYALGIA 02/01/2007  . GERD 09/29/2006  . Hypertension   . HYPERTENSION 09/29/2006  . Impaired glucose tolerance 04/02/2011  . INSOMNIA-SLEEP DISORDER-UNSPEC 03/07/2009  . NECK PAIN, LEFT 03/07/2009  . Osteoporosis   . PAIN, CHRONIC NEC 09/29/2006  . PEPTIC ULCER DISEASE 02/01/2007  . Personal history of venous thrombosis and embolism 02/01/2007  . PROTEIN C DEFICIENCY 09/29/2006  . RASH-NONVESICULAR 05/12/2007  . ROTATOR CUFF SYNDROME, RIGHT 02/01/2007  . SEIZURE DISORDER 09/29/2006  . Seizures (HCC)   . SLEEP APNEA, OBSTRUCTIVE, MODERATE 09/29/2006   was told CPAP would not help after he had an accident 2006  . SPONDYLOSIS, LUMBAR 02/01/2007   Past Surgical History:  Procedure Laterality Date  . APPENDECTOMY  age 61  . electrical stimulator in back    . FACIAL RECONSTRUCTION SURGERY  2006  . KNEE ARTHROSCOPY  04/2010   right  . LUMBAR FUSION  06/29/2014   L5 - S1    STIMULATOR INSERTION  . stimulator revision  2012    reports that  has never smoked. he has never used smokeless tobacco. He reports that he does not drink alcohol or use drugs. family history includes Hypertension in his other. Allergies  Allergen Reactions  . Topamax [Topiramate]  Hives  . Topical Sulfur Rash   Current Outpatient Medications on File Prior to Visit  Medication Sig Dispense Refill  . aspirin EC 81 MG tablet Take 1 tablet (81 mg total) by mouth daily. 90 tablet 11  . HYDROmorphone (DILAUDID) 4 MG tablet Take 4 mg by mouth 3 (three) times daily as needed. For pain  0   No current facility-administered medications on file prior to visit.    Review of Systems  Constitutional: Negative for other unusual diaphoresis or sweats HENT: Negative for ear discharge or swelling Eyes: Negative for other worsening visual disturbances Respiratory: Negative for stridor or other swelling  Gastrointestinal: Negative for worsening distension or other blood Genitourinary: Negative for retention or other urinary change Musculoskeletal: Negative for other MSK pain or swelling Skin: Negative for color change or other new lesions Neurological: Negative for worsening tremors and other numbness  Psychiatric/Behavioral: Negative for worsening agitation or other fatigue All other system neg per pt    Objective:   Physical Exam BP 130/88   Pulse 96   Temp 98.3 F (36.8 C) (Oral)   Ht 5\' 7"  (1.702 m)   Wt 201 lb (91.2 kg)   SpO2 98%   BMI 31.48 kg/m  VS noted, mild ill  Constitutional: Pt appears in NAD HENT: Head: NCAT.  Right Ear: External ear normal.  Left Ear: External ear normal.  Eyes: . Pupils are equal, round, and reactive to light. Conjunctivae and EOM are normal  Nose: without d/c or deformity Neck: Neck supple. Gross normal ROM Cardiovascular: Normal rate and regular rhythm.   Pulmonary/Chest: Effort normal and breath sounds decreased without rales but with mild scattered wheezing.  Neurological: Pt is alert. At baseline orientation, motor grossly intact Skin: Skin is warm. No rashes, other new lesions, no LE edema Psychiatric: Pt behavior is normal without agitation  No other exam findings    Assessment & Plan:

## 2017-01-28 NOTE — Patient Instructions (Addendum)
Please take all new medication as prescribed - the antibiotic, cough medicine and prednisone (to the local pharmacy)  You had the steroid shot today  Please continue all other medications as before, and [facility]  Please have the pharmacy call with any other refills you may need  Please keep your appointments with your specialists as you may have planned

## 2017-01-29 NOTE — Assessment & Plan Note (Signed)
,  Mild to mod, for predpac asd, depomedrol 80 IM,  to f/u any worsening symptoms or concerns

## 2017-01-29 NOTE — Assessment & Plan Note (Signed)
Mild to mod, c/w bronchitis vs pna, declines cxr, for antibx course, to f/u any worsening symptoms or concerns  

## 2017-01-29 NOTE — Assessment & Plan Note (Signed)
stable overall by history and exam, recent data reviewed with pt, and pt to continue medical treatment as before,  to f/u any worsening symptoms or concerns Lab Results  Component Value Date   HGBA1C 5.5 04/25/2016  pt to call for onset polys or cbg > 200 with tx

## 2017-02-04 ENCOUNTER — Ambulatory Visit: Payer: Medicare HMO | Admitting: Internal Medicine

## 2017-03-26 ENCOUNTER — Encounter: Payer: Self-pay | Admitting: *Deleted

## 2017-03-26 ENCOUNTER — Other Ambulatory Visit: Payer: Self-pay

## 2017-03-26 ENCOUNTER — Emergency Department
Admission: EM | Admit: 2017-03-26 | Discharge: 2017-03-26 | Disposition: A | Discharge status: Home / Self Care | Payer: Medicare HMO | Source: Home / Self Care | Attending: Family Medicine | Admitting: Family Medicine

## 2017-03-26 DIAGNOSIS — J209 Acute bronchitis, unspecified: Secondary | ICD-10-CM

## 2017-03-26 MED ORDER — IPRATROPIUM-ALBUTEROL 0.5-2.5 (3) MG/3ML IN SOLN
3.0000 mL | Freq: Once | RESPIRATORY_TRACT | Status: AC
  Administered 2017-03-26: 3 mL via RESPIRATORY_TRACT

## 2017-03-26 MED ORDER — METHYLPREDNISOLONE SODIUM SUCC 40 MG IJ SOLR
80.0000 mg | Freq: Once | INTRAMUSCULAR | Status: AC
Start: 2017-03-26 — End: 2017-03-26
  Administered 2017-03-26: 80 mg via INTRAMUSCULAR

## 2017-03-26 MED ORDER — BENZONATATE 100 MG PO CAPS: 100.0000 mg | ORAL_CAPSULE | Freq: Three times a day (TID) | ORAL | 0 refills | Status: DC

## 2017-03-26 MED ORDER — ALBUTEROL SULFATE HFA 108 (90 BASE) MCG/ACT IN AERS: 1.0000 | INHALATION_SPRAY | Freq: Four times a day (QID) | RESPIRATORY_TRACT | 0 refills | Status: DC | PRN

## 2017-03-26 MED ORDER — AZITHROMYCIN 250 MG PO TABS: 250.0000 mg | ORAL_TABLET | Freq: Every day | ORAL | 0 refills | Status: DC

## 2017-03-26 MED ORDER — PREDNISONE 20 MG PO TABS: ORAL_TABLET | ORAL | 0 refills | Status: DC

## 2017-03-26 NOTE — ED Provider Notes (Signed)
[facility]    CSN: [id number] Arrival date & time: 03/26/17  1850     History   Chief Complaint Chief Complaint  Patient presents with  . Cough    HPI [name] is a 62 y.o. male.   HPI  [name] is a 62 y.o. male presenting to UC with c/o nonproductive cough for 2 weeks with intermittent chest tightness.  He was seen by his PCP about 1 month ago for cough and congestion. He was given a shot of steroids and started on levaquin. He completed the course of antibiotics and felt better until about 2 weeks ago.  He has been trying OTC cough medication including mucinex w/o relief.  No hx of asthma or COPD.  Denies known sick contacts.  Denies fever, chills, n/v/d.      Past Medical History:  Diagnosis Date  . ANEMIA, PROTEIN-DEFICIENCY 09/29/2006  . ANXIETY 09/29/2006  . BACK PAIN 05/12/2007  . BRONCHITIS, ACUTE 02/27/2009  . DEPRESSION 09/29/2006  . DISC DISEASE, CERVICAL 02/01/2007  . FIBROMYALGIA 02/01/2007  . GERD 09/29/2006  . Hypertension   . HYPERTENSION 09/29/2006  . Impaired glucose tolerance 04/02/2011  . INSOMNIA-SLEEP DISORDER-UNSPEC 03/07/2009  . NECK PAIN, LEFT 03/07/2009  . Osteoporosis   . PAIN, CHRONIC NEC 09/29/2006  . PEPTIC ULCER DISEASE 02/01/2007  . Personal history of venous thrombosis and embolism 02/01/2007  . PROTEIN C DEFICIENCY 09/29/2006  . RASH-NONVESICULAR 05/12/2007  . ROTATOR CUFF SYNDROME, RIGHT 02/01/2007  . SEIZURE DISORDER 09/29/2006  . Seizures (HCC)   . SLEEP APNEA, OBSTRUCTIVE, MODERATE 09/29/2006   was told CPAP would not help after he had an accident 2006  . SPONDYLOSIS, LUMBAR 02/01/2007    Patient Active Problem List   Diagnosis Date Noted  . Cough 01/28/2017  . Wheezing 01/28/2017  . Bilateral leg pain 10/20/2016  . Chronic pain syndrome 09/30/2014  . Back pain 06/29/2014  . Hyperlipidemia 04/10/2014  . Left foot pain 10/27/2013  . Other malaise and fatigue 09/30/2013  . Mass of left side of neck 03/30/2012  .  Impaired glucose tolerance 04/02/2011  . Allergic rhinitis, cause unspecified 02/21/2011  . Tinnitus of right ear 02/21/2011  . Right knee pain 02/21/2011  . Weight loss 11/01/2010  . Preventative health care 10/05/2010  . INSOMNIA-SLEEP DISORDER-UNSPEC 03/07/2009  . Other dysphagia 10/06/2008  . BACK PAIN 05/12/2007  . PEPTIC ULCER DISEASE 02/01/2007  . SPONDYLOSIS, LUMBAR 02/01/2007  . DISC DISEASE, CERVICAL 02/01/2007  . ROTATOR CUFF SYNDROME, RIGHT 02/01/2007  . FIBROMYALGIA 02/01/2007  . Personal history of venous thrombosis and embolism 02/01/2007  . ANEMIA, PROTEIN-DEFICIENCY 09/29/2006  . PROTEIN C DEFICIENCY 09/29/2006  . ANXIETY 09/29/2006  . DEPRESSION 09/29/2006  . SLEEP APNEA, OBSTRUCTIVE, MODERATE 09/29/2006  . PAIN, CHRONIC NEC 09/29/2006  . Essential hypertension 09/29/2006  . GERD 09/29/2006  . Seizure disorder (HCC) 09/29/2006    Past Surgical History:  Procedure Laterality Date  . APPENDECTOMY  age 62  . electrical stimulator in back    . FACIAL RECONSTRUCTION SURGERY  2006  . KNEE ARTHROSCOPY  04/2010   right  . LUMBAR FUSION  06/29/2014   L5 - S1    STIMULATOR INSERTION  . SPINAL CORD STIMULATOR BATTERY EXCHANGE N/A 06/29/2014   Procedure: SPINAL CORD STIMULATOR BATTERY EXCHANGE;  Surgeon: Venita Lickahari Brooks, MD;  Location: [facility] OR;  Service: Orthopedics;  Laterality: N/A;  . stimulator revision  2012       Home Medications    Prior to  Admission medications   Medication Sig Start Date End Date Taking? Authorizing Provider  albuterol (PROVENTIL HFA;VENTOLIN HFA) 108 (90 Base) MCG/ACT inhaler Inhale 1-2 puffs into the lungs every 6 (six) hours as needed for wheezing or shortness of breath. 03/26/17   Lurene Shadow, PA-C  amitriptyline (ELAVIL) 150 MG tablet Take 1 tablet (150 mg total) at bedtime as needed by mouth for sleep. 01/28/17   Corwin Levins, MD  amLODipine (NORVASC) 5 MG tablet Take 1 tablet (5 mg total) daily by mouth. 01/28/17   Corwin Levins, MD    aspirin EC 81 MG tablet Take 1 tablet (81 mg total) by mouth daily. 10/04/15   Corwin Levins, MD  atorvastatin (LIPITOR) 10 MG tablet Take 1 tablet (10 mg total) daily by mouth. 01/28/17   Corwin Levins, MD  azithromycin (ZITHROMAX) 250 MG tablet Take 1 tablet (250 mg total) by mouth daily. Take first 2 tablets together, then 1 every day until finished. 03/26/17   Lurene Shadow, PA-C  benzonatate (TESSALON) 100 MG capsule Take 1-2 capsules (100-200 mg total) by mouth every 8 (eight) hours. 03/26/17   Lurene Shadow, PA-C  HYDROmorphone (DILAUDID) 4 MG tablet Take 4 mg by mouth 3 (three) times daily as needed. For pain 06/11/14   [provider]  lamoTRIgine (LAMICTAL) 200 MG tablet Take 1 tablet (200 mg total) daily by mouth. 01/28/17   Corwin Levins, MD  omeprazole (PRILOSEC) 40 MG capsule Take 1 capsule (40 mg total) daily by mouth. 01/28/17 01/28/18  Corwin Levins, MD  predniSONE (DELTASONE) 20 MG tablet 3 tabs po day one, then 2 po daily x 4 days 03/26/17   Lurene Shadow, PA-C    Family History Family History  Problem Relation Age of Onset  . Hypertension Other   . Colon cancer Neg Hx     Social History Social History   Tobacco Use  . Smoking status: Never Smoker  . Smokeless tobacco: Never Used  Substance Use Topics  . Alcohol use: No  . Drug use: No     Allergies   Topamax [topiramate] and Topical sulfur   Review of Systems Review of Systems  Constitutional: Negative for chills and fever.  HENT: Positive for congestion. Negative for ear pain, sore throat, trouble swallowing and voice change.   Respiratory: Positive for cough and chest tightness. Negative for shortness of breath.   Cardiovascular: Negative for chest pain and palpitations.  Gastrointestinal: Negative for abdominal pain, diarrhea, nausea and vomiting.  Musculoskeletal: Negative for arthralgias, back pain and myalgias.  Skin: Negative for rash.     Physical Exam Triage Vital Signs ED Triage Vitals   Enc Vitals Group     BP 03/26/17 1928 122/78     Pulse Rate 03/26/17 1928 88     Resp 03/26/17 1928 18     Temp 03/26/17 1928 98.3 F (36.8 C)     Temp Source 03/26/17 1928 Oral     SpO2 03/26/17 1928 96 %     Weight 03/26/17 1928 201 lb (91.2 kg)     Height 03/26/17 1928 5\' 8"  (1.727 m)     Head Circumference --      Peak Flow --      Pain Score 03/26/17 1929 0     Pain Loc --      Pain Edu? --      Excl. in GC? --    No data found.  Updated Vital Signs BP  122/78 (BP Location: Left Arm)   Pulse 88   Temp 98.3 F (36.8 C) (Oral)   Resp 18   Ht 5\' 8"  (1.727 m)   Wt 201 lb (91.2 kg)   SpO2 96%   BMI 30.56 kg/m   Visual Acuity Right Eye Distance:   Left Eye Distance:   Bilateral Distance:    Right Eye Near:   Left Eye Near:    Bilateral Near:     Physical Exam  Constitutional: He is oriented to person, place, and time. He appears well-developed and well-nourished. No distress.  HENT:  Head: Normocephalic and atraumatic.  Right Ear: Tympanic membrane normal.  Left Ear: Tympanic membrane normal.  Nose: Nose normal.  Mouth/Throat: Uvula is midline, oropharynx is clear and moist and mucous membranes are normal.  Eyes: EOM are normal.  Neck: Normal range of motion. Neck supple.  Cardiovascular: Normal rate and regular rhythm.  Pulmonary/Chest: Effort normal. No stridor. No respiratory distress. He has wheezes. He has rhonchi. He has no rales.  Diffuse wheeze and rhonchi w/o respiratory distress.   Musculoskeletal: Normal range of motion.  Lymphadenopathy:    He has no cervical adenopathy.  Neurological: He is alert and oriented to person, place, and time.  Skin: Skin is warm and dry. He is not diaphoretic.  Psychiatric: He has a normal mood and affect. His behavior is normal.  Nursing note and vitals reviewed.    UC Treatments / Results  Labs (all labs ordered are listed, but only abnormal results are displayed) Labs Reviewed - No data to display  EKG   EKG Interpretation None       Radiology No results found.  Procedures Procedures (including critical care time)  Medications Ordered in UC Medications  methylPREDNISolone sodium succinate (SOLU-MEDROL) 40 mg/mL injection 80 mg (80 mg Intramuscular Given 03/26/17 2001)  ipratropium-albuterol (DUONEB) 0.5-2.5 (3) MG/3ML nebulizer solution 3 mL (3 mLs Nebulization Given 03/26/17 1958)     Initial Impression / Assessment and Plan / UC Course  I have reviewed the triage vital signs and the nursing notes.  Pertinent labs & imaging results that were available during my care of the patient were reviewed by me and considered in my medical decision making (see chart for details).     Solumedrol and duoneb given in UC Lung sounds improved significantly and pt states he feels moderately better. Will start pt on Azithromycin along with prednisone, tessalon and albuterol inhaler Encouraged f/u with PCP next week if not improving, or if symptoms resolve but quickly return.  Discussed symptoms that warrant emergent care in the ED.   Final Clinical Impressions(s) / UC Diagnoses   Final diagnoses:  Acute bronchitis, unspecified organism    ED Discharge Orders        Ordered    predniSONE (DELTASONE) 20 MG tablet     03/26/17 2006    albuterol (PROVENTIL HFA;VENTOLIN HFA) 108 (90 Base) MCG/ACT inhaler  Every 6 hours PRN     03/26/17 2006    benzonatate (TESSALON) 100 MG capsule  Every 8 hours     03/26/17 2006    azithromycin (ZITHROMAX) 250 MG tablet  Daily     03/26/17 2006       Controlled Substance Prescriptions [place] Controlled Substance Registry consulted? Not Applicable   Rolla Plate 03/27/17 1610

## 2017-03-26 NOTE — ED Triage Notes (Signed)
Pt c/o nonproductive cough x 2wks. Denies fever. Reports taking an ABT and cough syrup last month from his PCP; symptoms resolved.

## 2017-03-26 NOTE — Discharge Instructions (Signed)
°  You may take 500mg acetaminophen every 4-6 hours or in combination with ibuprofen 400-600mg every 6-8 hours as needed for pain, inflammation, and fever. ° °Be sure to drink at least eight 8oz glasses of water to stay well hydrated and get at least 8 hours of sleep at night, preferably more while sick.  ° °Please take antibiotics as prescribed and be sure to complete entire course even if you start to feel better to ensure infection does not come back. ° °

## 2017-04-07 DIAGNOSIS — G894 Chronic pain syndrome: Secondary | ICD-10-CM | POA: Diagnosis not present

## 2017-04-07 DIAGNOSIS — Z9689 Presence of other specified functional implants: Secondary | ICD-10-CM | POA: Diagnosis not present

## 2017-04-07 DIAGNOSIS — M545 Low back pain: Secondary | ICD-10-CM | POA: Diagnosis not present

## 2017-04-07 DIAGNOSIS — M961 Postlaminectomy syndrome, not elsewhere classified: Secondary | ICD-10-CM | POA: Diagnosis not present

## 2017-04-07 DIAGNOSIS — Z79899 Other long term (current) drug therapy: Secondary | ICD-10-CM | POA: Diagnosis not present

## 2017-04-07 DIAGNOSIS — Z5181 Encounter for therapeutic drug level monitoring: Secondary | ICD-10-CM | POA: Diagnosis not present

## 2017-04-22 ENCOUNTER — Encounter: Payer: Self-pay | Admitting: Internal Medicine

## 2017-04-22 ENCOUNTER — Ambulatory Visit (INDEPENDENT_AMBULATORY_CARE_PROVIDER_SITE_OTHER): Payer: Medicare HMO | Admitting: Internal Medicine

## 2017-04-22 ENCOUNTER — Other Ambulatory Visit (INDEPENDENT_AMBULATORY_CARE_PROVIDER_SITE_OTHER): Payer: Medicare HMO

## 2017-04-22 VITALS — BP 120/78 | HR 94 | Temp 97.8°F | Ht 68.0 in | Wt 202.0 lb

## 2017-04-22 DIAGNOSIS — R7302 Impaired glucose tolerance (oral): Secondary | ICD-10-CM | POA: Diagnosis not present

## 2017-04-22 DIAGNOSIS — Z0000 Encounter for general adult medical examination without abnormal findings: Secondary | ICD-10-CM

## 2017-04-22 DIAGNOSIS — Z114 Encounter for screening for human immunodeficiency virus [HIV]: Secondary | ICD-10-CM

## 2017-04-22 LAB — CBC WITH DIFFERENTIAL/PLATELET
BASOS PCT: 0.8 % (ref 0.0–3.0)
Basophils Absolute: 0.1 10*3/uL (ref 0.0–0.1)
EOS PCT: 7.8 % — AB (ref 0.0–5.0)
Eosinophils Absolute: 0.5 10*3/uL (ref 0.0–0.7)
HCT: 42.9 % (ref 39.0–52.0)
HEMOGLOBIN: 15 g/dL (ref 13.0–17.0)
LYMPHS ABS: 2.1 10*3/uL (ref 0.7–4.0)
Lymphocytes Relative: 33 % (ref 12.0–46.0)
MCHC: 34.9 g/dL (ref 30.0–36.0)
MCV: 86.4 fl (ref 78.0–100.0)
MONOS PCT: 9.1 % (ref 3.0–12.0)
Monocytes Absolute: 0.6 10*3/uL (ref 0.1–1.0)
Neutro Abs: 3.2 10*3/uL (ref 1.4–7.7)
Neutrophils Relative %: 49.3 % (ref 43.0–77.0)
Platelets: 215 10*3/uL (ref 150.0–400.0)
RBC: 4.96 Mil/uL (ref 4.22–5.81)
RDW: 14 % (ref 11.5–15.5)
WBC: 6.4 10*3/uL (ref 4.0–10.5)

## 2017-04-22 LAB — PSA: PSA: 1.52 ng/mL (ref 0.10–4.00)

## 2017-04-22 LAB — URINALYSIS, ROUTINE W REFLEX MICROSCOPIC
BILIRUBIN URINE: NEGATIVE
HGB URINE DIPSTICK: NEGATIVE
Ketones, ur: NEGATIVE
LEUKOCYTES UA: NEGATIVE
NITRITE: NEGATIVE
RBC / HPF: NONE SEEN (ref 0–?)
Specific Gravity, Urine: 1.025 (ref 1.000–1.030)
Total Protein, Urine: NEGATIVE
Urine Glucose: NEGATIVE
Urobilinogen, UA: 0.2 (ref 0.0–1.0)
pH: 6 (ref 5.0–8.0)

## 2017-04-22 LAB — HEPATIC FUNCTION PANEL
ALBUMIN: 4.2 g/dL (ref 3.5–5.2)
ALT: 22 U/L (ref 0–53)
AST: 15 U/L (ref 0–37)
Alkaline Phosphatase: 98 U/L (ref 39–117)
BILIRUBIN TOTAL: 0.7 mg/dL (ref 0.2–1.2)
Bilirubin, Direct: 0.1 mg/dL (ref 0.0–0.3)
Total Protein: 7.1 g/dL (ref 6.0–8.3)

## 2017-04-22 LAB — LIPID PANEL
Cholesterol: 153 mg/dL (ref 0–200)
HDL: 41 mg/dL (ref >39.00)
LDL Cholesterol: 84 mg/dL (ref 0–99)
NONHDL: 112.15
TRIGLYCERIDES: 143 mg/dL (ref 0.0–149.0)
Total CHOL/HDL Ratio: 4
VLDL: 28.6 mg/dL (ref 0.0–40.0)

## 2017-04-22 LAB — BASIC METABOLIC PANEL
BUN: 22 mg/dL (ref 6–23)
CALCIUM: 9.2 mg/dL (ref 8.4–10.5)
CO2: 30 mEq/L (ref 19–32)
Chloride: 103 mEq/L (ref 96–112)
Creatinine, Ser: 1.26 mg/dL (ref 0.40–1.50)
GFR: 62.02 mL/min (ref >60.00)
GLUCOSE: 98 mg/dL (ref 70–99)
POTASSIUM: 4.4 meq/L (ref 3.5–5.1)
SODIUM: 140 meq/L (ref 135–145)

## 2017-04-22 LAB — TSH: TSH: 2.29 u[IU]/mL (ref 0.35–4.50)

## 2017-04-22 LAB — HEMOGLOBIN A1C: Hgb A1c MFr Bld: 5.9 % (ref 4.6–6.5)

## 2017-04-22 MED ORDER — ZOSTER VAC RECOMB ADJUVANTED 50 MCG/0.5ML IM SUSR: 0.5000 mL | Freq: Once | INTRAMUSCULAR | 1 refills | Status: AC

## 2017-04-22 MED ORDER — AMITRIPTYLINE HCL 150 MG PO TABS: 150.0000 mg | ORAL_TABLET | Freq: Every evening | ORAL | 3 refills | Status: DC | PRN

## 2017-04-22 NOTE — Patient Instructions (Addendum)
Your shingles shot was sent top the pharmacy   Please continue all other medications as before, and refills have been done if requested.  Please have the pharmacy call with any other refills you may need.  Please continue your efforts at being more active, low cholesterol diet, and weight control.  You are otherwise up to date with prevention measures today.  Please keep your appointments with your specialists as you may have planned  Please go to the LAB in the Basement (turn left off the elevator) for the tests to be done today  You will be contacted by phone if any changes need to be made immediately.  Otherwise, you will receive a letter about your results with an explanation, but please check with MyChart first.  Please remember to sign up for MyChart if you have not done so, as this will be important to you in the future with finding out test results, communicating by private email, and scheduling acute appointments online when needed.  Please return in 6 months, or sooner if needed

## 2017-04-22 NOTE — Assessment & Plan Note (Signed)

## 2017-04-22 NOTE — Assessment & Plan Note (Signed)
stable overall by history and exam, recent data reviewed with pt, and pt to continue medical treatment as before,  to f/u any worsening symptoms or concerns Lab Results  Component Value Date   HGBA1C 5.5 04/25/2016  for f/u lab today

## 2017-04-22 NOTE — Progress Notes (Signed)
Subjective:    Patient ID: [name], male    DOB: [birth year], 62 y.o.   MRN: [id number]  HPI  Here for wellness and f/u;  Overall doing ok;  Pt denies Chest pain, worsening SOB, DOE, wheezing, orthopnea, PND, worsening LE edema, palpitations, dizziness or syncope.  Pt denies neurological change such as new headache, facial or extremity weakness.  Pt denies polydipsia, polyuria, or low sugar symptoms. Pt states overall good compliance with treatment and medications, good tolerability, and has been trying to follow appropriate diet.  Pt denies worsening depressive symptoms, suicidal ideation or panic. No fever, night sweats, wt loss, loss of appetite, or other constitutional symptoms.  Pt states good ability with ADL's, has low fall risk, home safety reviewed and adequate, no other significant changes in hearing or vision, and only occasionally active with exercise. No new complaints Past Medical History:  Diagnosis Date  . ANEMIA, PROTEIN-DEFICIENCY 09/29/2006  . ANXIETY 09/29/2006  . BACK PAIN 05/12/2007  . BRONCHITIS, ACUTE 02/27/2009  . DEPRESSION 09/29/2006  . DISC DISEASE, CERVICAL 02/01/2007  . FIBROMYALGIA 02/01/2007  . GERD 09/29/2006  . Hypertension   . HYPERTENSION 09/29/2006  . Impaired glucose tolerance 04/02/2011  . INSOMNIA-SLEEP DISORDER-UNSPEC 03/07/2009  . NECK PAIN, LEFT 03/07/2009  . Osteoporosis   . PAIN, CHRONIC NEC 09/29/2006  . PEPTIC ULCER DISEASE 02/01/2007  . Personal history of venous thrombosis and embolism 02/01/2007  . PROTEIN C DEFICIENCY 09/29/2006  . RASH-NONVESICULAR 05/12/2007  . ROTATOR CUFF SYNDROME, RIGHT 02/01/2007  . SEIZURE DISORDER 09/29/2006  . Seizures (HCC)   . SLEEP APNEA, OBSTRUCTIVE, MODERATE 09/29/2006   was told CPAP would not help after he had an accident 2006  . SPONDYLOSIS, LUMBAR 02/01/2007   Past Surgical History:  Procedure Laterality Date  . APPENDECTOMY  age 89  . electrical stimulator in back    . FACIAL RECONSTRUCTION SURGERY  2006  .  KNEE ARTHROSCOPY  04/2010   right  . LUMBAR FUSION  06/29/2014   L5 - S1    STIMULATOR INSERTION  . SPINAL CORD STIMULATOR BATTERY EXCHANGE N/A 06/29/2014   Procedure: SPINAL CORD STIMULATOR BATTERY EXCHANGE;  Surgeon: Venita Lick, MD;  Location: [facility] OR;  Service: Orthopedics;  Laterality: N/A;  . stimulator revision  2012    reports that  has never smoked. he has never used smokeless tobacco. He reports that he does not drink alcohol or use drugs. family history includes Hypertension in his other. Allergies  Allergen Reactions  . Topamax [Topiramate] Hives  . Topical Sulfur Rash   Current Outpatient Medications on File Prior to Visit  Medication Sig Dispense Refill  . amLODipine (NORVASC) 5 MG tablet Take 1 tablet (5 mg total) daily by mouth. 90 tablet 3  . aspirin EC 81 MG tablet Take 1 tablet (81 mg total) by mouth daily. 90 tablet 11  . atorvastatin (LIPITOR) 10 MG tablet Take 1 tablet (10 mg total) daily by mouth. 90 tablet 3  . HYDROmorphone (DILAUDID) 4 MG tablet Take 4 mg by mouth 3 (three) times daily as needed. For pain  0  . lamoTRIgine (LAMICTAL) 200 MG tablet Take 1 tablet (200 mg total) daily by mouth. 90 tablet 3  . omeprazole (PRILOSEC) 40 MG capsule Take 1 capsule (40 mg total) daily by mouth. 90 capsule 3   No current facility-administered medications on file prior to visit.    Review of Systems Constitutional: Negative for other unusual diaphoresis, sweats, appetite or weight changes HENT: Negative for  other worsening hearing loss, ear pain, facial swelling, mouth sores or neck stiffness.   Eyes: Negative for other worsening pain, redness or other visual disturbance.  Respiratory: Negative for other stridor or swelling Cardiovascular: Negative for other palpitations or other chest pain  Gastrointestinal: Negative for worsening diarrhea or loose stools, blood in stool, distention or other pain Genitourinary: Negative for hematuria, flank pain or other change in urine  volume.  Musculoskeletal: Negative for myalgias or other joint swelling.  Skin: Negative for other color change, or other wound or worsening drainage.  Neurological: Negative for other syncope or numbness. Hematological: Negative for other adenopathy or swelling Psychiatric/Behavioral: Negative for hallucinations, other worsening agitation, SI, self-injury, or new decreased concentration All other system neg per pt    Objective:   Physical Exam BP 120/78   Pulse 94   Temp 97.8 F (36.6 C) (Oral)   Ht 5\' 8"  (1.727 m)   Wt 202 lb (91.6 kg)   SpO2 96%   BMI 30.71 kg/m  VS noted, obese Constitutional: Pt is oriented to person, place, and time. Appears well-developed and well-nourished, in no significant distress and comfortable Head: Normocephalic and atraumatic  Eyes: Conjunctivae and EOM are normal. Pupils are equal, round, and reactive to light Right Ear: External ear normal without discharge Left Ear: External ear normal without discharge Nose: Nose without discharge or deformity Mouth/Throat: Oropharynx is without other ulcerations and moist  Neck: Normal range of motion. Neck supple. No JVD present. No tracheal deviation present or significant neck LA or mass Cardiovascular: Normal rate, regular rhythm, normal heart sounds and intact distal pulses.   Pulmonary/Chest: WOB normal and breath sounds without rales or wheezing  Abdominal: Soft. Bowel sounds are normal. NT. No HSM  Musculoskeletal: Normal range of motion. Exhibits no edema Lymphadenopathy: Has no other cervical adenopathy.  Neurological: Pt is alert and oriented to person, place, and time. Pt has normal reflexes. No cranial nerve deficit. Motor grossly intact, Gait intact Skin: Skin is warm and dry. No rash noted or new ulcerations Psychiatric:  Has normal mood and affect. Behavior is normal without agitation No other exam findings Lab Results  Component Value Date   WBC 6.3 04/25/2016   HGB 15.9 04/25/2016   HCT  45.7 04/25/2016   PLT 232.0 04/25/2016   GLUCOSE 82 04/25/2016   CHOL 158 04/25/2016   TRIG 114.0 04/25/2016   HDL 40.20 04/25/2016   LDLDIRECT 101.0 04/06/2014   LDLCALC 95 04/25/2016   ALT 66 (H) 04/25/2016   AST 24 04/25/2016   NA 139 04/25/2016   K 4.4 04/25/2016   CL 101 04/25/2016   CREATININE 1.31 04/25/2016   BUN 15 04/25/2016   CO2 32 04/25/2016   TSH 1.05 04/25/2016   PSA 1.34 04/25/2016   INR 1.0 02/03/2008   HGBA1C 5.5 04/25/2016        Assessment & Plan:

## 2017-04-23 LAB — HIV ANTIBODY (ROUTINE TESTING W REFLEX): HIV: NONREACTIVE

## 2017-05-12 ENCOUNTER — Telehealth: Payer: Self-pay | Admitting: Internal Medicine

## 2017-05-12 NOTE — Telephone Encounter (Signed)
Copied from CRM [id number]. Topic: Quick Communication - See Telephone Encounter >> May 12, 2017  3:21 PM Terisa Starraylor, Brittany L wrote: CRM for notification. See Telephone encounter for:   05/12/17.   Patient said that amitriptyline (ELAVIL) 150 MG tablet is affecting his sex life. He said he knows its the medicine because that is when it started. He wants to taper off of it. Please call patient back [phone]

## 2017-05-12 NOTE — Telephone Encounter (Signed)
This does not really need to be weaned off; he can just stop, and maybe consider restart if the ED does not improve

## 2017-05-13 NOTE — Telephone Encounter (Signed)
Pt has been informed and expressed understanding.  

## 2017-06-02 DIAGNOSIS — G894 Chronic pain syndrome: Secondary | ICD-10-CM | POA: Diagnosis not present

## 2017-06-02 DIAGNOSIS — Z9689 Presence of other specified functional implants: Secondary | ICD-10-CM | POA: Diagnosis not present

## 2017-06-02 DIAGNOSIS — M545 Low back pain: Secondary | ICD-10-CM | POA: Diagnosis not present

## 2017-06-02 DIAGNOSIS — Z79899 Other long term (current) drug therapy: Secondary | ICD-10-CM | POA: Diagnosis not present

## 2017-06-02 DIAGNOSIS — Z5181 Encounter for therapeutic drug level monitoring: Secondary | ICD-10-CM | POA: Diagnosis not present

## 2017-06-02 DIAGNOSIS — M961 Postlaminectomy syndrome, not elsewhere classified: Secondary | ICD-10-CM | POA: Diagnosis not present

## 2017-06-30 DIAGNOSIS — R69 Illness, unspecified: Secondary | ICD-10-CM | POA: Diagnosis not present

## 2017-07-28 DIAGNOSIS — Z9689 Presence of other specified functional implants: Secondary | ICD-10-CM | POA: Diagnosis not present

## 2017-07-28 DIAGNOSIS — M961 Postlaminectomy syndrome, not elsewhere classified: Secondary | ICD-10-CM | POA: Diagnosis not present

## 2017-07-28 DIAGNOSIS — M1711 Unilateral primary osteoarthritis, right knee: Secondary | ICD-10-CM | POA: Diagnosis not present

## 2017-07-28 DIAGNOSIS — G894 Chronic pain syndrome: Secondary | ICD-10-CM | POA: Diagnosis not present

## 2017-10-07 DIAGNOSIS — M1711 Unilateral primary osteoarthritis, right knee: Secondary | ICD-10-CM | POA: Diagnosis not present

## 2017-10-07 DIAGNOSIS — M961 Postlaminectomy syndrome, not elsewhere classified: Secondary | ICD-10-CM | POA: Diagnosis not present

## 2017-10-07 DIAGNOSIS — Z9689 Presence of other specified functional implants: Secondary | ICD-10-CM | POA: Diagnosis not present

## 2017-10-07 DIAGNOSIS — G894 Chronic pain syndrome: Secondary | ICD-10-CM | POA: Diagnosis not present

## 2017-10-13 DIAGNOSIS — N134 Hydroureter: Secondary | ICD-10-CM | POA: Diagnosis not present

## 2017-10-13 DIAGNOSIS — R569 Unspecified convulsions: Secondary | ICD-10-CM | POA: Diagnosis not present

## 2017-10-13 DIAGNOSIS — N200 Calculus of kidney: Secondary | ICD-10-CM | POA: Diagnosis not present

## 2017-10-13 DIAGNOSIS — N1330 Unspecified hydronephrosis: Secondary | ICD-10-CM | POA: Diagnosis not present

## 2017-10-13 DIAGNOSIS — K219 Gastro-esophageal reflux disease without esophagitis: Secondary | ICD-10-CM | POA: Diagnosis not present

## 2017-10-13 DIAGNOSIS — Z969 Presence of functional implant, unspecified: Secondary | ICD-10-CM | POA: Diagnosis not present

## 2017-10-13 DIAGNOSIS — R109 Unspecified abdominal pain: Secondary | ICD-10-CM | POA: Diagnosis not present

## 2017-10-13 DIAGNOSIS — N132 Hydronephrosis with renal and ureteral calculous obstruction: Secondary | ICD-10-CM | POA: Diagnosis not present

## 2017-10-13 DIAGNOSIS — M810 Age-related osteoporosis without current pathological fracture: Secondary | ICD-10-CM | POA: Diagnosis not present

## 2017-10-13 DIAGNOSIS — R1084 Generalized abdominal pain: Secondary | ICD-10-CM | POA: Diagnosis not present

## 2017-10-13 DIAGNOSIS — M1990 Unspecified osteoarthritis, unspecified site: Secondary | ICD-10-CM | POA: Diagnosis not present

## 2017-10-13 DIAGNOSIS — F329 Major depressive disorder, single episode, unspecified: Secondary | ICD-10-CM | POA: Diagnosis not present

## 2017-10-13 DIAGNOSIS — Z886 Allergy status to analgesic agent status: Secondary | ICD-10-CM | POA: Diagnosis not present

## 2017-10-13 DIAGNOSIS — I10 Essential (primary) hypertension: Secondary | ICD-10-CM | POA: Diagnosis not present

## 2017-10-15 ENCOUNTER — Telehealth: Payer: Self-pay

## 2017-10-15 DIAGNOSIS — R740 Nonspecific elevation of levels of transaminase and lactic acid dehydrogenase [LDH]: Secondary | ICD-10-CM | POA: Diagnosis not present

## 2017-10-15 DIAGNOSIS — F419 Anxiety disorder, unspecified: Secondary | ICD-10-CM | POA: Diagnosis not present

## 2017-10-15 DIAGNOSIS — R7989 Other specified abnormal findings of blood chemistry: Secondary | ICD-10-CM | POA: Diagnosis not present

## 2017-10-15 DIAGNOSIS — N132 Hydronephrosis with renal and ureteral calculous obstruction: Secondary | ICD-10-CM | POA: Diagnosis not present

## 2017-10-15 DIAGNOSIS — N179 Acute kidney failure, unspecified: Secondary | ICD-10-CM | POA: Diagnosis not present

## 2017-10-15 DIAGNOSIS — N201 Calculus of ureter: Secondary | ICD-10-CM | POA: Diagnosis not present

## 2017-10-15 DIAGNOSIS — N200 Calculus of kidney: Secondary | ICD-10-CM | POA: Diagnosis not present

## 2017-10-15 DIAGNOSIS — G8929 Other chronic pain: Secondary | ICD-10-CM | POA: Diagnosis not present

## 2017-10-15 DIAGNOSIS — F329 Major depressive disorder, single episode, unspecified: Secondary | ICD-10-CM | POA: Diagnosis not present

## 2017-10-15 DIAGNOSIS — N131 Hydronephrosis with ureteral stricture, not elsewhere classified: Secondary | ICD-10-CM | POA: Diagnosis not present

## 2017-10-15 DIAGNOSIS — G40909 Epilepsy, unspecified, not intractable, without status epilepticus: Secondary | ICD-10-CM | POA: Diagnosis not present

## 2017-10-15 DIAGNOSIS — R109 Unspecified abdominal pain: Secondary | ICD-10-CM | POA: Diagnosis not present

## 2017-10-15 DIAGNOSIS — E785 Hyperlipidemia, unspecified: Secondary | ICD-10-CM | POA: Diagnosis not present

## 2017-10-15 DIAGNOSIS — I10 Essential (primary) hypertension: Secondary | ICD-10-CM | POA: Diagnosis not present

## 2017-10-15 DIAGNOSIS — N1330 Unspecified hydronephrosis: Secondary | ICD-10-CM | POA: Diagnosis not present

## 2017-10-15 NOTE — Telephone Encounter (Signed)
Would a hospital follow up OV be needed to address these concerns and referral? Please advise and I will reach out to the patient.   Copied from CRM [phone]#[id number]. Topic: Inquiry >> Oct 15, 2017 10:15 AM Windy KalataMichael, Taylor L, NT wrote: Reason for CRM: patient is requesting Dr. Jonny RuizJohn nurse to give him a call in regards to a kidney stone. He states he was seen in the ER for this and he is unsure if he passed it or not. He also is needing a referral to a Urologist about this. He also [facility] nurse or Dr. Jonny RuizJohn will have to request his records to be sent to the office. Please advise.  >> Oct 15, 2017  2:17 PM Marquis Buggyverman, Brittany A wrote: Spoke with patient. I informed him he had to have an appointment to get a referral. He states he knows now for a fact he has to have kidney stones. He is going to go to the ER. He refused an appointment with Dr.John for today.

## 2017-10-15 NOTE — Telephone Encounter (Signed)
He may need a urology referral, but this usually comes from the ED, so we can hold on contacting for now

## 2017-10-16 DIAGNOSIS — G40909 Epilepsy, unspecified, not intractable, without status epilepticus: Secondary | ICD-10-CM | POA: Diagnosis not present

## 2017-10-16 DIAGNOSIS — G8929 Other chronic pain: Secondary | ICD-10-CM | POA: Diagnosis not present

## 2017-10-16 DIAGNOSIS — I10 Essential (primary) hypertension: Secondary | ICD-10-CM | POA: Diagnosis not present

## 2017-10-16 DIAGNOSIS — N179 Acute kidney failure, unspecified: Secondary | ICD-10-CM | POA: Diagnosis not present

## 2017-10-16 DIAGNOSIS — N201 Calculus of ureter: Secondary | ICD-10-CM | POA: Diagnosis not present

## 2017-10-16 DIAGNOSIS — N1330 Unspecified hydronephrosis: Secondary | ICD-10-CM | POA: Diagnosis not present

## 2017-10-16 DIAGNOSIS — F329 Major depressive disorder, single episode, unspecified: Secondary | ICD-10-CM | POA: Diagnosis not present

## 2017-10-20 ENCOUNTER — Ambulatory Visit (INDEPENDENT_AMBULATORY_CARE_PROVIDER_SITE_OTHER): Payer: Medicare HMO | Admitting: Internal Medicine

## 2017-10-20 ENCOUNTER — Encounter: Payer: Self-pay | Admitting: Internal Medicine

## 2017-10-20 ENCOUNTER — Other Ambulatory Visit (INDEPENDENT_AMBULATORY_CARE_PROVIDER_SITE_OTHER): Payer: Medicare HMO

## 2017-10-20 VITALS — BP 130/86 | HR 78 | Temp 98.0°F | Ht 68.0 in | Wt 197.0 lb

## 2017-10-20 DIAGNOSIS — I10 Essential (primary) hypertension: Secondary | ICD-10-CM | POA: Diagnosis not present

## 2017-10-20 DIAGNOSIS — R7302 Impaired glucose tolerance (oral): Secondary | ICD-10-CM

## 2017-10-20 DIAGNOSIS — N179 Acute kidney failure, unspecified: Secondary | ICD-10-CM | POA: Diagnosis not present

## 2017-10-20 DIAGNOSIS — Z0000 Encounter for general adult medical examination without abnormal findings: Secondary | ICD-10-CM

## 2017-10-20 LAB — BASIC METABOLIC PANEL
BUN: 28 mg/dL — AB (ref 6–23)
CALCIUM: 9.8 mg/dL (ref 8.4–10.5)
CHLORIDE: 100 meq/L (ref 96–112)
CO2: 30 meq/L (ref 19–32)
CREATININE: 1.39 mg/dL (ref 0.40–1.50)
GFR: 55.28 mL/min — ABNORMAL LOW (ref >60.00)
GLUCOSE: 101 mg/dL — AB (ref 70–99)
Potassium: 4.3 mEq/L (ref 3.5–5.1)
Sodium: 138 mEq/L (ref 135–145)

## 2017-10-20 NOTE — Progress Notes (Signed)
Subjective:    Patient ID: [name], male    DOB: 06/20/1955, 62 y.o.   MRN: [id number]  HPI  Here to f/u; overall doing ok,  Pt denies chest pain, increasing sob or doe, wheezing, orthopnea, PND, increased LE swelling, palpitations, dizziness or syncope.  Pt denies new neurological symptoms such as new headache, or facial or extremity weakness or numbness.  Pt denies polydipsia, polyuria, or low sugar episode.  Pt states overall good compliance with meds, mostly trying to follow appropriate diet, with wt overall stable. Just s/p hospn for 1 night recently at [facility] with renal stone and obstrction, and AKI, now better s/p ureteral stent, with f/u later this wk to hopefully remove on Friday, not yet passed stone, to see Dr Jewel Baizeraven.  Taking the flomax daily.  And azo that makes urine orange.  Past Medical History:  Diagnosis Date  . ANEMIA, PROTEIN-DEFICIENCY 09/29/2006  . ANXIETY 09/29/2006  . BACK PAIN 05/12/2007  . BRONCHITIS, ACUTE 02/27/2009  . DEPRESSION 09/29/2006  . DISC DISEASE, CERVICAL 02/01/2007  . FIBROMYALGIA 02/01/2007  . GERD 09/29/2006  . Hypertension   . HYPERTENSION 09/29/2006  . Impaired glucose tolerance 04/02/2011  . INSOMNIA-SLEEP DISORDER-UNSPEC 03/07/2009  . NECK PAIN, LEFT 03/07/2009  . Osteoporosis   . PAIN, CHRONIC NEC 09/29/2006  . PEPTIC ULCER DISEASE 02/01/2007  . Personal history of venous thrombosis and embolism 02/01/2007  . PROTEIN C DEFICIENCY 09/29/2006  . RASH-NONVESICULAR 05/12/2007  . ROTATOR CUFF SYNDROME, RIGHT 02/01/2007  . SEIZURE DISORDER 09/29/2006  . Seizures (HCC)   . SLEEP APNEA, OBSTRUCTIVE, MODERATE 09/29/2006   was told CPAP would not help after he had an accident 2006  . SPONDYLOSIS, LUMBAR 02/01/2007   Past Surgical History:  Procedure Laterality Date  . APPENDECTOMY  age 62  . electrical stimulator in back    . FACIAL RECONSTRUCTION SURGERY  2006  . KNEE ARTHROSCOPY  04/2010   right  . LUMBAR FUSION  06/29/2014   L5 - S1    STIMULATOR  INSERTION  . SPINAL CORD STIMULATOR BATTERY EXCHANGE N/A 06/29/2014   Procedure: SPINAL CORD STIMULATOR BATTERY EXCHANGE;  Surgeon: Venita Lickahari Brooks, MD;  Location: [facility] OR;  Service: Orthopedics;  Laterality: N/A;  . stimulator revision  2012    reports that he has never smoked. He has never used smokeless tobacco. He reports that he does not drink alcohol or use drugs. family history includes Hypertension in his other. Allergies  Allergen Reactions  . Topamax [Topiramate] Hives  . Topical Sulfur Rash   Current Outpatient Medications on File Prior to Visit  Medication Sig Dispense Refill  . amitriptyline (ELAVIL) 150 MG tablet Take 1 tablet (150 mg total) by mouth at bedtime as needed for sleep. 90 tablet 3  . amLODipine (NORVASC) 5 MG tablet Take 1 tablet (5 mg total) daily by mouth. 90 tablet 3  . aspirin EC 81 MG tablet Take 1 tablet (81 mg total) by mouth daily. 90 tablet 11  . atorvastatin (LIPITOR) 10 MG tablet Take 1 tablet (10 mg total) daily by mouth. 90 tablet 3  . HYDROmorphone (DILAUDID) 4 MG tablet Take 4 mg by mouth 3 (three) times daily as needed. For pain  0  . lamoTRIgine (LAMICTAL) 200 MG tablet Take 1 tablet (200 mg total) daily by mouth. 90 tablet 3  . omeprazole (PRILOSEC) 40 MG capsule Take 1 capsule (40 mg total) daily by mouth. 90 capsule 3   No current facility-administered medications on file prior  to visit.    Review of Systems  Constitutional: Negative for other unusual diaphoresis or sweats HENT: Negative for ear discharge or swelling Eyes: Negative for other worsening visual disturbances Respiratory: Negative for stridor or other swelling  Gastrointestinal: Negative for worsening distension or other blood Genitourinary: Negative for retention or other urinary change Musculoskeletal: Negative for other MSK pain or swelling Skin: Negative for color change or other new lesions Neurological: Negative for worsening tremors and other numbness    Psychiatric/Behavioral: Negative for worsening agitation or other fatigue All other system neg per pt    Objective:   Physical Exam BP 130/86   Pulse 78   Temp 98 F (36.7 C) (Oral)   Ht 5\' 8"  (1.727 m)   Wt 197 lb (89.4 kg)   SpO2 96%   BMI 29.95 kg/m  VS noted, not ill appearing Constitutional: Pt appears in NAD HENT: Head: NCAT.  Right Ear: External ear normal.  Left Ear: External ear normal.  Eyes: . Pupils are equal, round, and reactive to light. Conjunctivae and EOM are normal Nose: without d/c or deformity Neck: Neck supple. Gross normal ROM Cardiovascular: Normal rate and regular rhythm.   Pulmonary/Chest: Effort normal and breath sounds without rales or wheezing.  Neurological: Pt is alert. At baseline orientation, motor grossly intact Skin: Skin is warm. No rashes, other new lesions, no LE edema Psychiatric: Pt behavior is normal without agitation  No other exam findings Lab Results  Component Value Date   WBC 6.4 04/22/2017   HGB 15.0 04/22/2017   HCT 42.9 04/22/2017   PLT 215.0 04/22/2017   GLUCOSE 101 (H) 10/20/2017   CHOL 153 04/22/2017   TRIG 143.0 04/22/2017   HDL 41.00 04/22/2017   LDLDIRECT 101.0 04/06/2014   LDLCALC 84 04/22/2017   ALT 22 04/22/2017   AST 15 04/22/2017   NA 138 10/20/2017   K 4.3 10/20/2017   CL 100 10/20/2017   CREATININE 1.39 10/20/2017   BUN 28 (H) 10/20/2017   CO2 30 10/20/2017   TSH 2.29 04/22/2017   PSA 1.52 04/22/2017   INR 1.0 02/03/2008   HGBA1C 5.9 04/22/2017       Assessment & Plan:

## 2017-10-20 NOTE — Assessment & Plan Note (Signed)
/  stable overall by history and exam, recent data reviewed with pt, and pt to continue medical treatment as before,  to f/u any worsening symptoms or concerns BP Readings from Last 3 Encounters:  10/20/17 130/86  04/22/17 120/78  03/26/17 122/78

## 2017-10-20 NOTE — Assessment & Plan Note (Addendum)
For f/u lab today, appears hydrated/volume stable today, for urology as planned

## 2017-10-20 NOTE — Patient Instructions (Addendum)
OK to restart the elavil  Please continue all other medications as before, and refills have been done if requested.  Please have the pharmacy call with any other refills you may need.  Please continue your efforts at being more active, low cholesterol diet, and weight control.  Please keep your appointments with your specialists as you may have planned  Please go to the LAB in the Basement (turn left off the elevator) for the tests to be done today  You will be contacted by phone if any changes need to be made immediately.  Otherwise, you will receive a letter about your results with an explanation, but please check with MyChart first.  Please remember to sign up for MyChart if you have not done so, as this will be important to you in the future with finding out test results, communicating by private email, and scheduling acute appointments online when needed.  Please return in 6 months, or sooner if needed, with Lab testing done 3-5 days before

## 2017-10-20 NOTE — Assessment & Plan Note (Signed)
stable overall by history and exam, recent data reviewed with pt, and pt to continue medical treatment as before,  to f/u any worsening symptoms or concerns Lab Results  Component Value Date   HGBA1C 5.9 04/22/2017

## 2017-10-24 DIAGNOSIS — N179 Acute kidney failure, unspecified: Secondary | ICD-10-CM | POA: Diagnosis not present

## 2017-10-24 DIAGNOSIS — T8384XA Pain from genitourinary prosthetic devices, implants and grafts, initial encounter: Secondary | ICD-10-CM | POA: Diagnosis not present

## 2017-10-24 DIAGNOSIS — N200 Calculus of kidney: Secondary | ICD-10-CM | POA: Diagnosis not present

## 2017-11-03 DIAGNOSIS — F419 Anxiety disorder, unspecified: Secondary | ICD-10-CM | POA: Diagnosis not present

## 2017-11-03 DIAGNOSIS — N201 Calculus of ureter: Secondary | ICD-10-CM | POA: Diagnosis not present

## 2017-11-03 DIAGNOSIS — G4733 Obstructive sleep apnea (adult) (pediatric): Secondary | ICD-10-CM | POA: Diagnosis not present

## 2017-11-03 DIAGNOSIS — I10 Essential (primary) hypertension: Secondary | ICD-10-CM | POA: Diagnosis not present

## 2017-11-03 DIAGNOSIS — Z466 Encounter for fitting and adjustment of urinary device: Secondary | ICD-10-CM | POA: Diagnosis not present

## 2017-11-03 DIAGNOSIS — N179 Acute kidney failure, unspecified: Secondary | ICD-10-CM | POA: Diagnosis not present

## 2017-11-03 DIAGNOSIS — F329 Major depressive disorder, single episode, unspecified: Secondary | ICD-10-CM | POA: Diagnosis not present

## 2017-11-03 DIAGNOSIS — E785 Hyperlipidemia, unspecified: Secondary | ICD-10-CM | POA: Diagnosis not present

## 2017-11-03 DIAGNOSIS — K219 Gastro-esophageal reflux disease without esophagitis: Secondary | ICD-10-CM | POA: Diagnosis not present

## 2017-11-03 DIAGNOSIS — N200 Calculus of kidney: Secondary | ICD-10-CM | POA: Diagnosis not present

## 2017-11-03 DIAGNOSIS — G40909 Epilepsy, unspecified, not intractable, without status epilepticus: Secondary | ICD-10-CM | POA: Diagnosis not present

## 2017-11-03 DIAGNOSIS — N132 Hydronephrosis with renal and ureteral calculous obstruction: Secondary | ICD-10-CM | POA: Diagnosis not present

## 2017-12-08 DIAGNOSIS — M1711 Unilateral primary osteoarthritis, right knee: Secondary | ICD-10-CM | POA: Diagnosis not present

## 2017-12-08 DIAGNOSIS — M961 Postlaminectomy syndrome, not elsewhere classified: Secondary | ICD-10-CM | POA: Diagnosis not present

## 2017-12-08 DIAGNOSIS — Z9689 Presence of other specified functional implants: Secondary | ICD-10-CM | POA: Diagnosis not present

## 2017-12-08 DIAGNOSIS — G894 Chronic pain syndrome: Secondary | ICD-10-CM | POA: Diagnosis not present

## 2018-01-05 DIAGNOSIS — N201 Calculus of ureter: Secondary | ICD-10-CM | POA: Diagnosis not present

## 2018-01-05 DIAGNOSIS — N1330 Unspecified hydronephrosis: Secondary | ICD-10-CM | POA: Diagnosis not present

## 2018-01-13 DIAGNOSIS — N200 Calculus of kidney: Secondary | ICD-10-CM | POA: Diagnosis not present

## 2018-01-13 DIAGNOSIS — N179 Acute kidney failure, unspecified: Secondary | ICD-10-CM | POA: Diagnosis not present

## 2018-03-02 ENCOUNTER — Telehealth: Payer: Self-pay | Admitting: Internal Medicine

## 2018-03-02 NOTE — Telephone Encounter (Signed)
Copied from CRM [id number]. Topic: Quick Communication - Rx Refill/Question >> Mar 02, 2018  3:32 PM Jaquita RectorDavis, Karen A wrote: Medication: atorvastatin (LIPITOR) 10 MG tablet, amLODipine (NORVASC) 5 MG tablet,  lamoTRIgine (LAMICTAL) 200 MG tablet,  omeprazole (PRILOSEC) 40 MG capsule  Has the patient contacted their pharmacy? Yes.   (Agent: If no, request that the patient contact the pharmacy for the refill.) (Agent: If yes, when and what did the pharmacy advise?)  Preferred Pharmacy (with phone number or street name): [facility] [place] - [id number] [phone] (Phone) [phone] (Fax)    Agent: Please be advised that RX refills may take up to 3 business days. We ask that you follow-up with your pharmacy.

## 2018-03-04 MED ORDER — OMEPRAZOLE 40 MG PO CPDR: 40.0000 mg | DELAYED_RELEASE_CAPSULE | Freq: Every day | ORAL | 0 refills | Status: DC

## 2018-03-04 MED ORDER — ATORVASTATIN CALCIUM 10 MG PO TABS: 10.0000 mg | ORAL_TABLET | Freq: Every day | ORAL | 0 refills | Status: DC

## 2018-03-04 MED ORDER — AMLODIPINE BESYLATE 5 MG PO TABS: 5.0000 mg | ORAL_TABLET | Freq: Every day | ORAL | 0 refills | Status: DC

## 2018-03-04 MED ORDER — LAMOTRIGINE 200 MG PO TABS: 200.0000 mg | ORAL_TABLET | Freq: Every day | ORAL | 0 refills | Status: DC

## 2018-03-04 NOTE — Addendum Note (Signed)
Addended by: Roney MansGAY, Surah Pelley on: 03/04/2018 01:05 PM   Modules accepted: Orders

## 2018-03-04 NOTE — Telephone Encounter (Signed)
Requested medication (s) are due for refill today: yes  Requested medication (s) are on the active medication list: yes  Last refill:  01/28/17 expired  Future visit scheduled: yes  Notes to clinic: medication not delegated    Requested Prescriptions  Pending Prescriptions Disp Refills   lamoTRIgine (LAMICTAL) 200 MG tablet 90 tablet 3    Sig: Take 1 tablet (200 mg total) by mouth daily.     Not Delegated - Neurology:  Anticonvulsants Failed - 03/03/2018  1:58 PM      Failed - This refill cannot be delegated      Passed - HCT in normal range and within 360 days    HCT  Date Value Ref Range Status  04/22/2017 42.9 39.0 - 52.0 % Final         Passed - HGB in normal range and within 360 days    Hemoglobin  Date Value Ref Range Status  04/22/2017 15.0 13.0 - 17.0 g/dL Final         Passed - PLT in normal range and within 360 days    Platelets  Date Value Ref Range Status  04/22/2017 215.0 150.0 - 400.0 K/uL Final         Passed - WBC in normal range and within 360 days    WBC  Date Value Ref Range Status  04/22/2017 6.4 4.0 - 10.5 K/uL Final  06/18/2014  4.5 - 10.5 K/uL Final    Comment:    results scanned         Passed - Valid encounter within last 12 months    Recent Outpatient Visits          4 months ago AKI (acute kidney injury) [facility](HCC)   [facility] John, James W, MD   10 months ago Preventative health care   [facility] John, James W, MD   1 year ago Cough   [facility] John, James W, MD   1 year ago Impaired glucose tolerance   [facility] John, James W, MD   1 year ago Preventative health care   [facility] John, James W, MD      Future Appointments            In 1 month Corwin LevinsJohn, James W, MD [facility]         Signed Prescriptions Disp Refills   amLODipine (NORVASC) 5 MG tablet 90 tablet  0    Sig: Take 1 tablet (5 mg total) by mouth daily.     Cardiovascular:  Calcium Channel Blockers Passed - 03/03/2018  1:58 PM      Passed - Last BP in normal range    BP Readings from Last 1 Encounters:  10/20/17 130/86         Passed - Valid encounter within last 6 months    Recent Outpatient Visits          4 months ago AKI (acute kidney injury) [facility](HCC)   [facility], Len BlalockJames W, MD   10 months ago Preventative health care   [facility] [facility], James W, MD   1 year ago Cough   [facility] John, James W, MD   1 year ago Impaired glucose tolerance    [facility] [facility], James W, MD   1 year ago Preventative health care  [facility], MD      Future Appointments            In 1 month Jonny Ruiz Len Blalock, MD [facility], [facility]          atorvastatin (LIPITOR) 10 MG tablet 90 tablet 0    Sig: Take 1 tablet (10 mg total) by mouth daily.     Cardiovascular:  Antilipid - Statins Passed - 03/03/2018  1:58 PM      Passed - Total Cholesterol in normal range and within 360 days    Cholesterol  Date Value Ref Range Status  04/22/2017 153 0 - 200 mg/dL Final    Comment:    ATP III Classification       Desirable:  < 200 mg/dL               Borderline High:  200 - 239 mg/dL          High:  > = 147 mg/dL         Passed - LDL in normal range and within 360 days    LDL Cholesterol  Date Value Ref Range Status  04/22/2017 84 0 - 99 mg/dL Final         Passed - HDL in normal range and within 360 days    HDL  Date Value Ref Range Status  04/22/2017 41.00 >39.00 mg/dL Final         Passed - Triglycerides in normal range and within 360 days    Triglycerides  Date Value Ref Range Status  04/22/2017 143.0 0.0 - 149.0 mg/dL Final    Comment:    Normal:  <150 mg/dLBorderline High:  150 - 199 mg/dL         Passed - Patient  is not pregnant      Passed - Valid encounter within last 12 months    Recent Outpatient Visits          4 months ago AKI (acute kidney injury) (HCC)   [facility], MD   10 months ago Preventative health care   [facility], MD   1 year ago Cough   [facility], MD   1 year ago Impaired glucose tolerance   [facility], MD   1 year ago Preventative health care   [facility] -[place], MD      Future Appointments            In 1 month Corwin Levins, MD [facility], [facility]          omeprazole (PRILOSEC) 40 MG capsule 90 capsule 0    Sig: Take 1 capsule (40 mg total) by mouth daily.     Gastroenterology: Proton Pump Inhibitors Passed - 03/03/2018  1:58 PM      Passed - Valid encounter within last 12 months    Recent Outpatient Visits          4 months ago AKI (acute kidney injury) [facility])   [facility], Len Blalock, MD   10 months ago Preventative health care   [facility], MD   1 year ago Cough   [facility], MD   1 year ago Impaired  glucose tolerance   [facility], MD   1 year ago Preventative health care   [facility] -[place], MD      Future Appointments            In 1 month Jonny Ruiz, Len Blalock, MD [facility], [facility]

## 2018-03-13 DIAGNOSIS — M961 Postlaminectomy syndrome, not elsewhere classified: Secondary | ICD-10-CM | POA: Diagnosis not present

## 2018-03-13 DIAGNOSIS — Z79899 Other long term (current) drug therapy: Secondary | ICD-10-CM | POA: Diagnosis not present

## 2018-03-13 DIAGNOSIS — M545 Low back pain: Secondary | ICD-10-CM | POA: Diagnosis not present

## 2018-03-13 DIAGNOSIS — Z9689 Presence of other specified functional implants: Secondary | ICD-10-CM | POA: Diagnosis not present

## 2018-03-13 DIAGNOSIS — Z5181 Encounter for therapeutic drug level monitoring: Secondary | ICD-10-CM | POA: Diagnosis not present

## 2018-03-13 DIAGNOSIS — G894 Chronic pain syndrome: Secondary | ICD-10-CM | POA: Diagnosis not present

## 2018-04-22 ENCOUNTER — Encounter: Payer: Self-pay | Admitting: Internal Medicine

## 2018-04-22 ENCOUNTER — Other Ambulatory Visit (INDEPENDENT_AMBULATORY_CARE_PROVIDER_SITE_OTHER): Payer: Medicare HMO

## 2018-04-22 ENCOUNTER — Ambulatory Visit (INDEPENDENT_AMBULATORY_CARE_PROVIDER_SITE_OTHER): Payer: Medicare HMO | Admitting: Internal Medicine

## 2018-04-22 VITALS — BP 124/88 | HR 77 | Temp 98.0°F | Ht 68.0 in | Wt 204.0 lb

## 2018-04-22 DIAGNOSIS — Z0000 Encounter for general adult medical examination without abnormal findings: Secondary | ICD-10-CM

## 2018-04-22 DIAGNOSIS — R7302 Impaired glucose tolerance (oral): Secondary | ICD-10-CM

## 2018-04-22 LAB — URINALYSIS, ROUTINE W REFLEX MICROSCOPIC
BILIRUBIN URINE: NEGATIVE
HGB URINE DIPSTICK: NEGATIVE
Ketones, ur: NEGATIVE
Leukocytes, UA: NEGATIVE
NITRITE: NEGATIVE
Specific Gravity, Urine: >=1.03 — AB (ref 1.000–1.030)
TOTAL PROTEIN, URINE-UPE24: NEGATIVE
URINE GLUCOSE: NEGATIVE
UROBILINOGEN UA: 0.2 (ref 0.0–1.0)
pH: 6 (ref 5.0–8.0)

## 2018-04-22 LAB — LIPID PANEL
CHOL/HDL RATIO: 4
Cholesterol: 143 mg/dL (ref 0–200)
HDL: 37.1 mg/dL — ABNORMAL LOW (ref >39.00)
LDL Cholesterol: 85 mg/dL (ref 0–99)
NonHDL: 105.41
TRIGLYCERIDES: 104 mg/dL (ref 0.0–149.0)
VLDL: 20.8 mg/dL (ref 0.0–40.0)

## 2018-04-22 LAB — CBC WITH DIFFERENTIAL/PLATELET
Basophils Absolute: 0.1 10*3/uL (ref 0.0–0.1)
Basophils Relative: 1.1 % (ref 0.0–3.0)
Eosinophils Absolute: 0.3 10*3/uL (ref 0.0–0.7)
Eosinophils Relative: 5.2 % — ABNORMAL HIGH (ref 0.0–5.0)
HCT: 45.1 % (ref 39.0–52.0)
Hemoglobin: 15.3 g/dL (ref 13.0–17.0)
Lymphocytes Relative: 39.1 % (ref 12.0–46.0)
Lymphs Abs: 2.6 10*3/uL (ref 0.7–4.0)
MCHC: 34 g/dL (ref 30.0–36.0)
MCV: 84.6 fl (ref 78.0–100.0)
Monocytes Absolute: 0.5 10*3/uL (ref 0.1–1.0)
Monocytes Relative: 8 % (ref 3.0–12.0)
Neutro Abs: 3.1 10*3/uL (ref 1.4–7.7)
Neutrophils Relative %: 46.6 % (ref 43.0–77.0)
Platelets: 201 10*3/uL (ref 150.0–400.0)
RBC: 5.33 Mil/uL (ref 4.22–5.81)
RDW: 14 % (ref 11.5–15.5)
WBC: 6.7 10*3/uL (ref 4.0–10.5)

## 2018-04-22 LAB — BASIC METABOLIC PANEL
BUN: 22 mg/dL (ref 6–23)
CALCIUM: 9.6 mg/dL (ref 8.4–10.5)
CO2: 29 mEq/L (ref 19–32)
Chloride: 101 mEq/L (ref 96–112)
Creatinine, Ser: 1.36 mg/dL (ref 0.40–1.50)
GFR: 53.25 mL/min — ABNORMAL LOW (ref >60.00)
Glucose, Bld: 100 mg/dL — ABNORMAL HIGH (ref 70–99)
Potassium: 4.1 mEq/L (ref 3.5–5.1)
Sodium: 140 mEq/L (ref 135–145)

## 2018-04-22 LAB — HEPATIC FUNCTION PANEL
ALT: 30 U/L (ref 0–53)
AST: 19 U/L (ref 0–37)
Albumin: 4.5 g/dL (ref 3.5–5.2)
Alkaline Phosphatase: 91 U/L (ref 39–117)
Bilirubin, Direct: 0.1 mg/dL (ref 0.0–0.3)
TOTAL PROTEIN: 7.5 g/dL (ref 6.0–8.3)
Total Bilirubin: 0.8 mg/dL (ref 0.2–1.2)

## 2018-04-22 LAB — TSH: TSH: 2.02 u[IU]/mL (ref 0.35–4.50)

## 2018-04-22 LAB — PSA: PSA: 1.27 ng/mL (ref 0.10–4.00)

## 2018-04-22 LAB — HEMOGLOBIN A1C: Hgb A1c MFr Bld: 5.8 % (ref 4.6–6.5)

## 2018-04-22 MED ORDER — ZOSTER VAC RECOMB ADJUVANTED 50 MCG/0.5ML IM SUSR: 0.5000 mL | Freq: Once | INTRAMUSCULAR | 1 refills | Status: AC

## 2018-04-22 MED ORDER — AMITRIPTYLINE HCL 150 MG PO TABS: 150.0000 mg | ORAL_TABLET | Freq: Every evening | ORAL | 1 refills | Status: DC | PRN

## 2018-04-22 MED ORDER — LAMOTRIGINE 200 MG PO TABS: 200.0000 mg | ORAL_TABLET | Freq: Every day | ORAL | 1 refills | Status: DC

## 2018-04-22 MED ORDER — AMLODIPINE BESYLATE 5 MG PO TABS: 5.0000 mg | ORAL_TABLET | Freq: Every day | ORAL | 1 refills | Status: DC

## 2018-04-22 MED ORDER — OMEPRAZOLE 40 MG PO CPDR: 40.0000 mg | DELAYED_RELEASE_CAPSULE | Freq: Every day | ORAL | 1 refills | Status: DC

## 2018-04-22 MED ORDER — ATORVASTATIN CALCIUM 10 MG PO TABS: 10.0000 mg | ORAL_TABLET | Freq: Every day | ORAL | 1 refills | Status: DC

## 2018-04-22 NOTE — Progress Notes (Signed)
Subjective:    Patient ID: [name], male    DOB: 07/23/1955, 63 y.o.   MRN: [id number]  HPI  Here for wellness and f/u;  Overall doing ok;  Pt denies Chest pain, worsening SOB, DOE, wheezing, orthopnea, PND, worsening LE edema, palpitations, dizziness or syncope.  Pt denies neurological change such as new headache, facial or extremity weakness.  Pt denies polydipsia, polyuria, or low sugar symptoms. Pt states overall good compliance with treatment and medications, good tolerability, and has been trying to follow appropriate diet.  Pt denies worsening depressive symptoms, suicidal ideation or panic. No fever, night sweats, wt loss, loss of appetite, or other constitutional symptoms.  Pt states good ability with ADL's, has low fall risk, home safety reviewed and adequate, no other significant changes in hearing or vision, and only occasionally active with exercise.  No new complaints Past Medical History:  Diagnosis Date  . ANEMIA, PROTEIN-DEFICIENCY 09/29/2006  . ANXIETY 09/29/2006  . BACK PAIN 05/12/2007  . BRONCHITIS, ACUTE 02/27/2009  . DEPRESSION 09/29/2006  . DISC DISEASE, CERVICAL 02/01/2007  . FIBROMYALGIA 02/01/2007  . GERD 09/29/2006  . Hypertension   . HYPERTENSION 09/29/2006  . Impaired glucose tolerance 04/02/2011  . INSOMNIA-SLEEP DISORDER-UNSPEC 03/07/2009  . NECK PAIN, LEFT 03/07/2009  . Osteoporosis   . PAIN, CHRONIC NEC 09/29/2006  . PEPTIC ULCER DISEASE 02/01/2007  . Personal history of venous thrombosis and embolism 02/01/2007  . PROTEIN C DEFICIENCY 09/29/2006  . RASH-NONVESICULAR 05/12/2007  . ROTATOR CUFF SYNDROME, RIGHT 02/01/2007  . SEIZURE DISORDER 09/29/2006  . Seizures (HCC)   . SLEEP APNEA, OBSTRUCTIVE, MODERATE 09/29/2006   was told CPAP would not help after he had an accident 2006  . SPONDYLOSIS, LUMBAR 02/01/2007   Past Surgical History:  Procedure Laterality Date  . APPENDECTOMY  age 40  . electrical stimulator in back    . FACIAL RECONSTRUCTION SURGERY  2006  .  KNEE ARTHROSCOPY  04/2010   right  . LUMBAR FUSION  06/29/2014   L5 - S1    STIMULATOR INSERTION  . SPINAL CORD STIMULATOR BATTERY EXCHANGE N/A 06/29/2014   Procedure: SPINAL CORD STIMULATOR BATTERY EXCHANGE;  Surgeon: Venita Lick, MD;  Location: [facility] OR;  Service: Orthopedics;  Laterality: N/A;  . stimulator revision  2012    reports that he has never smoked. He has never used smokeless tobacco. He reports that he does not drink alcohol or use drugs. family history includes Hypertension in an other family member. Allergies  Allergen Reactions  . Topamax [Topiramate] Hives  . Topical Sulfur Rash   Current Outpatient Medications on File Prior to Visit  Medication Sig Dispense Refill  . aspirin EC 81 MG tablet Take 1 tablet (81 mg total) by mouth daily. 90 tablet 11  . HYDROmorphone (DILAUDID) 4 MG tablet Take 4 mg by mouth 3 (three) times daily as needed. For pain  0   No current facility-administered medications on file prior to visit.    Review of Systems Constitutional: Negative for other unusual diaphoresis, sweats, appetite or weight changes HENT: Negative for other worsening hearing loss, ear pain, facial swelling, mouth sores or neck stiffness.   Eyes: Negative for other worsening pain, redness or other visual disturbance.  Respiratory: Negative for other stridor or swelling Cardiovascular: Negative for other palpitations or other chest pain  Gastrointestinal: Negative for worsening diarrhea or loose stools, blood in stool, distention or other pain Genitourinary: Negative for hematuria, flank pain or other change in urine volume.  Musculoskeletal:  Negative for myalgias or other joint swelling.  Skin: Negative for other color change, or other wound or worsening drainage.  Neurological: Negative for other syncope or numbness. Hematological: Negative for other adenopathy or swelling Psychiatric/Behavioral: Negative for hallucinations, other worsening agitation, SI, self-injury, or  new decreased concentration All other system neg per pt    Objective:   Physical Exam BP 124/88   Pulse 77   Temp 98 F (36.7 C) (Oral)   Ht 5\' 8"  (1.727 m)   Wt 204 lb (92.5 kg)   SpO2 97%   BMI 31.02 kg/m  VS noted,  Constitutional: Pt is oriented to person, place, and time. Appears well-developed and well-nourished, in no significant distress and comfortable Head: Normocephalic and atraumatic  Eyes: Conjunctivae and EOM are normal. Pupils are equal, round, and reactive to light Right Ear: External ear normal without discharge Left Ear: External ear normal without discharge Nose: Nose without discharge or deformity Mouth/Throat: Oropharynx is without other ulcerations and moist  Neck: Normal range of motion. Neck supple. No JVD present. No tracheal deviation present or significant neck LA or mass Cardiovascular: Normal rate, regular rhythm, normal heart sounds and intact distal pulses.   Pulmonary/Chest: WOB normal and breath sounds without rales or wheezing  Abdominal: Soft. Bowel sounds are normal. NT. No HSM  Musculoskeletal: Normal range of motion. Exhibits no edema Lymphadenopathy: Has no other cervical adenopathy.  Neurological: Pt is alert and oriented to person, place, and time. Pt has normal reflexes. No cranial nerve deficit. Motor grossly intact, Gait intact Skin: Skin is warm and dry. No rash noted or new ulcerations Psychiatric:  Has normal mood and affect. Behavior is normal without agitation No other exam findings Lab Results  Component Value Date   WBC 6.7 04/22/2018   HGB 15.3 04/22/2018   HCT 45.1 04/22/2018   PLT 201.0 04/22/2018   GLUCOSE 100 (H) 04/22/2018   CHOL 143 04/22/2018   TRIG 104.0 04/22/2018   HDL 37.10 (L) 04/22/2018   LDLDIRECT 101.0 04/06/2014   LDLCALC 85 04/22/2018   ALT 30 04/22/2018   AST 19 04/22/2018   NA 140 04/22/2018   K 4.1 04/22/2018   CL 101 04/22/2018   CREATININE 1.36 04/22/2018   BUN 22 04/22/2018   CO2 29  04/22/2018   TSH 2.02 04/22/2018   PSA 1.27 04/22/2018   INR 1.0 02/03/2008   HGBA1C 5.8 04/22/2018          Assessment & Plan:

## 2018-04-22 NOTE — Patient Instructions (Addendum)

## 2018-04-23 NOTE — Assessment & Plan Note (Signed)

## 2018-04-23 NOTE — Assessment & Plan Note (Signed)
stable overall by history and exam, recent data reviewed with pt, and pt to continue medical treatment as before,  to f/u any worsening symptoms or concerns  

## 2018-05-08 DIAGNOSIS — G894 Chronic pain syndrome: Secondary | ICD-10-CM | POA: Diagnosis not present

## 2018-05-08 DIAGNOSIS — M961 Postlaminectomy syndrome, not elsewhere classified: Secondary | ICD-10-CM | POA: Diagnosis not present

## 2018-05-08 DIAGNOSIS — M545 Low back pain: Secondary | ICD-10-CM | POA: Diagnosis not present

## 2018-05-08 DIAGNOSIS — M1711 Unilateral primary osteoarthritis, right knee: Secondary | ICD-10-CM | POA: Diagnosis not present

## 2018-07-06 DIAGNOSIS — M545 Low back pain: Secondary | ICD-10-CM | POA: Diagnosis not present

## 2018-07-06 DIAGNOSIS — M1711 Unilateral primary osteoarthritis, right knee: Secondary | ICD-10-CM | POA: Diagnosis not present

## 2018-07-06 DIAGNOSIS — M961 Postlaminectomy syndrome, not elsewhere classified: Secondary | ICD-10-CM | POA: Diagnosis not present

## 2018-07-06 DIAGNOSIS — G894 Chronic pain syndrome: Secondary | ICD-10-CM | POA: Diagnosis not present

## 2018-08-06 DIAGNOSIS — G894 Chronic pain syndrome: Secondary | ICD-10-CM | POA: Diagnosis not present

## 2018-08-06 DIAGNOSIS — M545 Low back pain: Secondary | ICD-10-CM | POA: Diagnosis not present

## 2018-08-06 DIAGNOSIS — M961 Postlaminectomy syndrome, not elsewhere classified: Secondary | ICD-10-CM | POA: Diagnosis not present

## 2018-08-06 DIAGNOSIS — M1711 Unilateral primary osteoarthritis, right knee: Secondary | ICD-10-CM | POA: Diagnosis not present

## 2018-09-18 ENCOUNTER — Other Ambulatory Visit: Payer: Self-pay | Admitting: Internal Medicine

## 2018-10-21 DIAGNOSIS — Z79899 Other long term (current) drug therapy: Secondary | ICD-10-CM | POA: Diagnosis not present

## 2018-10-21 DIAGNOSIS — M1711 Unilateral primary osteoarthritis, right knee: Secondary | ICD-10-CM | POA: Diagnosis not present

## 2018-10-21 DIAGNOSIS — G894 Chronic pain syndrome: Secondary | ICD-10-CM | POA: Diagnosis not present

## 2018-10-21 DIAGNOSIS — M961 Postlaminectomy syndrome, not elsewhere classified: Secondary | ICD-10-CM | POA: Diagnosis not present

## 2018-10-21 DIAGNOSIS — M545 Low back pain: Secondary | ICD-10-CM | POA: Diagnosis not present

## 2018-10-21 DIAGNOSIS — Z5181 Encounter for therapeutic drug level monitoring: Secondary | ICD-10-CM | POA: Diagnosis not present

## 2018-10-22 ENCOUNTER — Ambulatory Visit: Payer: Medicare HMO | Admitting: Internal Medicine

## 2018-11-17 ENCOUNTER — Other Ambulatory Visit: Payer: Self-pay

## 2018-11-17 ENCOUNTER — Encounter: Payer: Self-pay | Admitting: Internal Medicine

## 2018-11-17 ENCOUNTER — Ambulatory Visit (INDEPENDENT_AMBULATORY_CARE_PROVIDER_SITE_OTHER): Payer: Medicare HMO | Admitting: Internal Medicine

## 2018-11-17 VITALS — BP 122/80 | HR 86 | Temp 98.0°F | Ht 68.0 in | Wt 206.0 lb

## 2018-11-17 DIAGNOSIS — E559 Vitamin D deficiency, unspecified: Secondary | ICD-10-CM | POA: Diagnosis not present

## 2018-11-17 DIAGNOSIS — E538 Deficiency of other specified B group vitamins: Secondary | ICD-10-CM

## 2018-11-17 DIAGNOSIS — E785 Hyperlipidemia, unspecified: Secondary | ICD-10-CM

## 2018-11-17 DIAGNOSIS — Z23 Encounter for immunization: Secondary | ICD-10-CM | POA: Diagnosis not present

## 2018-11-17 DIAGNOSIS — R7302 Impaired glucose tolerance (oral): Secondary | ICD-10-CM | POA: Diagnosis not present

## 2018-11-17 DIAGNOSIS — I10 Essential (primary) hypertension: Secondary | ICD-10-CM | POA: Diagnosis not present

## 2018-11-17 DIAGNOSIS — E611 Iron deficiency: Secondary | ICD-10-CM | POA: Diagnosis not present

## 2018-11-17 DIAGNOSIS — Z0000 Encounter for general adult medical examination without abnormal findings: Secondary | ICD-10-CM

## 2018-11-17 MED ORDER — LAMOTRIGINE 200 MG PO TABS: 200.0000 mg | ORAL_TABLET | Freq: Every day | ORAL | 3 refills | Status: DC

## 2018-11-17 MED ORDER — AMITRIPTYLINE HCL 150 MG PO TABS: 150.0000 mg | ORAL_TABLET | Freq: Every evening | ORAL | 1 refills | Status: DC | PRN

## 2018-11-17 MED ORDER — OMEPRAZOLE 40 MG PO CPDR: 40.0000 mg | DELAYED_RELEASE_CAPSULE | Freq: Every day | ORAL | 3 refills | Status: DC

## 2018-11-17 MED ORDER — AMLODIPINE BESYLATE 5 MG PO TABS: 5.0000 mg | ORAL_TABLET | Freq: Every day | ORAL | 3 refills | Status: DC

## 2018-11-17 MED ORDER — ATORVASTATIN CALCIUM 10 MG PO TABS: 10.0000 mg | ORAL_TABLET | Freq: Every day | ORAL | 3 refills | Status: DC

## 2018-11-17 NOTE — Progress Notes (Signed)
Subjective:    Patient ID: [name], male    DOB: 06/24/1955, 63 y.o.   MRN: [id number]  HPI  Here to f/u; overall doing ok,  Pt denies chest pain, increasing sob or doe, wheezing, orthopnea, PND, increased LE swelling, palpitations, dizziness or syncope.  Pt denies new neurological symptoms such as new headache, or facial or extremity weakness or numbness.  Pt denies polydipsia, polyuria, or low sugar episode.  Pt states overall good compliance with meds, mostly trying to follow appropriate diet, with wt overall stable,  but little exercise however.  Gained 9 lbs in 1 yr with limitations to walking for exercise due to back pain.  Plans to see provider who placed stimulator which seems to over react with walking 1 mile, plans to f/u soon.   Wt Readings from Last 3 Encounters:  11/17/18 206 lb (93.4 kg)  04/22/18 204 lb (92.5 kg)  10/20/17 197 lb (89.4 kg)   Past Medical History:  Diagnosis Date  . ANEMIA, PROTEIN-DEFICIENCY 09/29/2006  . ANXIETY 09/29/2006  . BACK PAIN 05/12/2007  . BRONCHITIS, ACUTE 02/27/2009  . DEPRESSION 09/29/2006  . DISC DISEASE, CERVICAL 02/01/2007  . FIBROMYALGIA 02/01/2007  . GERD 09/29/2006  . Hypertension   . HYPERTENSION 09/29/2006  . Impaired glucose tolerance 04/02/2011  . INSOMNIA-SLEEP DISORDER-UNSPEC 03/07/2009  . NECK PAIN, LEFT 03/07/2009  . Osteoporosis   . PAIN, CHRONIC NEC 09/29/2006  . PEPTIC ULCER DISEASE 02/01/2007  . Personal history of venous thrombosis and embolism 02/01/2007  . PROTEIN C DEFICIENCY 09/29/2006  . RASH-NONVESICULAR 05/12/2007  . ROTATOR CUFF SYNDROME, RIGHT 02/01/2007  . SEIZURE DISORDER 09/29/2006  . Seizures (HCC)   . SLEEP APNEA, OBSTRUCTIVE, MODERATE 09/29/2006   was told CPAP would not help after he had an accident 2006  . SPONDYLOSIS, LUMBAR 02/01/2007   Past Surgical History:  Procedure Laterality Date  . APPENDECTOMY  age 63  . electrical stimulator in back    . FACIAL RECONSTRUCTION SURGERY  2006  . KNEE ARTHROSCOPY   04/2010   right  . LUMBAR FUSION  06/29/2014   L5 - S1    STIMULATOR INSERTION  . SPINAL CORD STIMULATOR BATTERY EXCHANGE N/A 06/29/2014   Procedure: SPINAL CORD STIMULATOR BATTERY EXCHANGE;  Surgeon: Venita Lickahari Brooks, MD;  Location: [facility] OR;  Service: Orthopedics;  Laterality: N/A;  . stimulator revision  2012    reports that he has never smoked. He has never used smokeless tobacco. He reports that he does not drink alcohol or use drugs. family history includes Hypertension in an other family member. Allergies  Allergen Reactions  . Topamax [Topiramate] Hives  . Topical Sulfur Rash   Current Outpatient Medications on File Prior to Visit  Medication Sig Dispense Refill  . aspirin EC 81 MG tablet Take 1 tablet (81 mg total) by mouth daily. 90 tablet 11  . HYDROmorphone (DILAUDID) 4 MG tablet Take 4 mg by mouth 3 (three) times daily as needed. For pain  0   No current facility-administered medications on file prior to visit.    Review of Systems  Constitutional: Negative for other unusual diaphoresis or sweats HENT: Negative for ear discharge or swelling Eyes: Negative for other worsening visual disturbances Respiratory: Negative for stridor or other swelling  Gastrointestinal: Negative for worsening distension or other blood Genitourinary: Negative for retention or other urinary change Musculoskeletal: Negative for other MSK pain or swelling Skin: Negative for color change or other new lesions Neurological: Negative for worsening tremors and other numbness  Psychiatric/Behavioral: Negative for worsening agitation or other fatigue All other system neg per pt    Objective:   Physical Exam BP 122/80   Pulse 86   Temp 98 F (36.7 C) (Oral)   Ht 5\' 8"  (1.727 m)   Wt 206 lb (93.4 kg)   SpO2 95%   BMI 31.32 kg/m  VS noted,  Constitutional: Pt appears in NAD HENT: Head: NCAT.  Right Ear: External ear normal.  Left Ear: External ear normal.  Eyes: . Pupils are equal, round, and  reactive to light. Conjunctivae and EOM are normal Nose: without d/c or deformity Neck: Neck supple. Gross normal ROM Cardiovascular: Normal rate and regular rhythm.   Pulmonary/Chest: Effort normal and breath sounds without rales or wheezing.  Abd:  Soft, NT, ND, + BS, no organomegaly Neurological: Pt is alert. At baseline orientation, motor grossly intact Skin: Skin is warm. No rashes, other new lesions, no LE edema Psychiatric: Pt behavior is normal without agitation  No other exam findings Lab Results  Component Value Date   WBC 6.7 04/22/2018   HGB 15.3 04/22/2018   HCT 45.1 04/22/2018   PLT 201.0 04/22/2018   GLUCOSE 100 (H) 04/22/2018   CHOL 143 04/22/2018   TRIG 104.0 04/22/2018   HDL 37.10 (L) 04/22/2018   LDLDIRECT 101.0 04/06/2014   LDLCALC 85 04/22/2018   ALT 30 04/22/2018   AST 19 04/22/2018   NA 140 04/22/2018   K 4.1 04/22/2018   CL 101 04/22/2018   CREATININE 1.36 04/22/2018   BUN 22 04/22/2018   CO2 29 04/22/2018   TSH 2.02 04/22/2018   PSA 1.27 04/22/2018   INR 1.0 02/03/2008   HGBA1C 5.8 04/22/2018       Assessment & Plan:

## 2018-11-17 NOTE — Patient Instructions (Addendum)
You had the flu shot today  Please continue all other medications as before, and refills have been done if requested.  Please have the pharmacy call with any other refills you may need.  Please continue your efforts at being more active, low cholesterol diet, and weight control.  Please keep your appointments with your specialists as you may have planned  Please return in 6 months, or sooner if needed, with Lab testing done 3-5 days before  

## 2018-11-20 ENCOUNTER — Encounter: Payer: Self-pay | Admitting: Internal Medicine

## 2018-11-20 NOTE — Assessment & Plan Note (Signed)
stable overall by history and exam, recent data reviewed with pt, and pt to continue medical treatment as before,  to f/u any worsening symptoms or concerns  

## 2018-12-16 DIAGNOSIS — M961 Postlaminectomy syndrome, not elsewhere classified: Secondary | ICD-10-CM | POA: Diagnosis not present

## 2018-12-16 DIAGNOSIS — M545 Low back pain: Secondary | ICD-10-CM | POA: Diagnosis not present

## 2018-12-16 DIAGNOSIS — M1711 Unilateral primary osteoarthritis, right knee: Secondary | ICD-10-CM | POA: Diagnosis not present

## 2018-12-16 DIAGNOSIS — G894 Chronic pain syndrome: Secondary | ICD-10-CM | POA: Diagnosis not present

## 2019-02-10 DIAGNOSIS — M1711 Unilateral primary osteoarthritis, right knee: Secondary | ICD-10-CM | POA: Diagnosis not present

## 2019-02-10 DIAGNOSIS — M961 Postlaminectomy syndrome, not elsewhere classified: Secondary | ICD-10-CM | POA: Diagnosis not present

## 2019-02-10 DIAGNOSIS — M545 Low back pain: Secondary | ICD-10-CM | POA: Diagnosis not present

## 2019-02-10 DIAGNOSIS — G894 Chronic pain syndrome: Secondary | ICD-10-CM | POA: Diagnosis not present

## 2019-02-20 ENCOUNTER — Other Ambulatory Visit: Payer: Self-pay

## 2019-02-20 ENCOUNTER — Emergency Department
Admission: EM | Admit: 2019-02-20 | Discharge: 2019-02-20 | Disposition: A | Discharge status: Home / Self Care | Payer: Medicare HMO | Source: Home / Self Care

## 2019-02-20 ENCOUNTER — Encounter: Payer: Self-pay | Admitting: Emergency Medicine

## 2019-02-20 DIAGNOSIS — J069 Acute upper respiratory infection, unspecified: Secondary | ICD-10-CM

## 2019-02-20 DIAGNOSIS — R519 Headache, unspecified: Secondary | ICD-10-CM

## 2019-02-20 DIAGNOSIS — T7840XA Allergy, unspecified, initial encounter: Secondary | ICD-10-CM

## 2019-02-20 DIAGNOSIS — R05 Cough: Secondary | ICD-10-CM

## 2019-02-20 DIAGNOSIS — B9789 Other viral agents as the cause of diseases classified elsewhere: Secondary | ICD-10-CM | POA: Diagnosis not present

## 2019-02-20 MED ORDER — PREDNISONE 20 MG PO TABS: 40.0000 mg | ORAL_TABLET | Freq: Every day | ORAL | 0 refills | Status: AC

## 2019-02-20 MED ORDER — FLUTICASONE PROPIONATE 50 MCG/ACT NA SUSP: 1.0000 | Freq: Every day | NASAL | 0 refills | Status: DC

## 2019-02-20 MED ORDER — BENZONATATE 100 MG PO CAPS: 100.0000 mg | ORAL_CAPSULE | Freq: Three times a day (TID) | ORAL | 0 refills | Status: DC

## 2019-02-20 NOTE — Discharge Instructions (Signed)
Take medications as prescribed. Be sure to follow-up with PCP in 1 week for persistent, worsening symptoms. Return sooner than that for worsening cough, shortness of breath, chest pain, fever.

## 2019-02-20 NOTE — ED Triage Notes (Signed)
Pt c/o wheezing worse at night when lying down, headache behind eyes, taking Mucinex and allergy meds, non-productive cough, no diarrhea, no ear pain, runny nose, sneezing

## 2019-02-20 NOTE — ED Provider Notes (Signed)
[facility]    CSN: [id number] Arrival date & time: 02/20/19  1119      History   Chief Complaint Chief Complaint  Patient presents with  . Wheezing    HPI Joyce CopaDarrell E Stream is a 63 y.o. male with history of hypertension, sleep apnea presenting for wheezing, dry cough, and frontal headache x2 days.  States he was taking Mucinex and allergy medications with adequate relief of headache.  Patient has chest pain, shortness of breath, known sick contacts.   Past Medical History:  Diagnosis Date  . ANEMIA, PROTEIN-DEFICIENCY 09/29/2006  . ANXIETY 09/29/2006  . BACK PAIN 05/12/2007  . BRONCHITIS, ACUTE 02/27/2009  . DEPRESSION 09/29/2006  . DISC DISEASE, CERVICAL 02/01/2007  . FIBROMYALGIA 02/01/2007  . GERD 09/29/2006  . Hypertension   . HYPERTENSION 09/29/2006  . Impaired glucose tolerance 04/02/2011  . INSOMNIA-SLEEP DISORDER-UNSPEC 03/07/2009  . NECK PAIN, LEFT 03/07/2009  . Osteoporosis   . PAIN, CHRONIC NEC 09/29/2006  . PEPTIC ULCER DISEASE 02/01/2007  . Personal history of venous thrombosis and embolism 02/01/2007  . PROTEIN C DEFICIENCY 09/29/2006  . RASH-NONVESICULAR 05/12/2007  . ROTATOR CUFF SYNDROME, RIGHT 02/01/2007  . SEIZURE DISORDER 09/29/2006  . Seizures (HCC)   . SLEEP APNEA, OBSTRUCTIVE, MODERATE 09/29/2006   was told CPAP would not help after he had an accident 2006  . SPONDYLOSIS, LUMBAR 02/01/2007    Patient Active Problem List   Diagnosis Date Noted  . AKI (acute kidney injury) (HCC) 10/20/2017  . Cough 01/28/2017  . Wheezing 01/28/2017  . Bilateral leg pain 10/20/2016  . Chronic pain syndrome 09/30/2014  . Back pain 06/29/2014  . Hyperlipidemia 04/10/2014  . Left foot pain 10/27/2013  . Other malaise and fatigue 09/30/2013  . Mass of left side of neck 03/30/2012  . Impaired glucose tolerance 04/02/2011  . Allergic rhinitis, cause unspecified 02/21/2011  . Tinnitus of right ear 02/21/2011  . Right knee pain 02/21/2011  . Weight loss 11/01/2010  .  Preventative health care 10/05/2010  . INSOMNIA-SLEEP DISORDER-UNSPEC 03/07/2009  . Other dysphagia 10/06/2008  . BACK PAIN 05/12/2007  . PEPTIC ULCER DISEASE 02/01/2007  . SPONDYLOSIS, LUMBAR 02/01/2007  . DISC DISEASE, CERVICAL 02/01/2007  . ROTATOR CUFF SYNDROME, RIGHT 02/01/2007  . FIBROMYALGIA 02/01/2007  . Personal history of venous thrombosis and embolism 02/01/2007  . ANEMIA, PROTEIN-DEFICIENCY 09/29/2006  . PROTEIN C DEFICIENCY 09/29/2006  . ANXIETY 09/29/2006  . DEPRESSION 09/29/2006  . SLEEP APNEA, OBSTRUCTIVE, MODERATE 09/29/2006  . PAIN, CHRONIC NEC 09/29/2006  . Essential hypertension 09/29/2006  . GERD 09/29/2006  . Seizure disorder (HCC) 09/29/2006    Past Surgical History:  Procedure Laterality Date  . APPENDECTOMY  age 63  . electrical stimulator in back    . FACIAL RECONSTRUCTION SURGERY  2006  . KNEE ARTHROSCOPY  04/2010   right  . LUMBAR FUSION  06/29/2014   L5 - S1    STIMULATOR INSERTION  . SPINAL CORD STIMULATOR BATTERY EXCHANGE N/A 06/29/2014   Procedure: SPINAL CORD STIMULATOR BATTERY EXCHANGE;  Surgeon: Venita Lickahari Brooks, MD;  Location: [facility] OR;  Service: Orthopedics;  Laterality: N/A;  . stimulator revision  2012       Home Medications    Prior to Admission medications   Medication Sig Start Date End Date Taking? Authorizing Provider  amLODipine (NORVASC) 5 MG tablet Take 1 tablet (5 mg total) by mouth daily. 11/17/18  Yes Corwin LevinsJohn, James W, MD  aspirin EC 81 MG tablet Take 1 tablet (81 mg total) by  mouth daily. 10/04/15  Yes Corwin Levins, MD  atorvastatin (LIPITOR) 10 MG tablet Take 1 tablet (10 mg total) by mouth daily. 11/17/18  Yes Corwin Levins, MD  HYDROmorphone (DILAUDID) 4 MG tablet Take 4 mg by mouth 3 (three) times daily as needed. For pain 06/11/14  Yes [provider]  lamoTRIgine (LAMICTAL) 200 MG tablet Take 1 tablet (200 mg total) by mouth daily. 11/17/18  Yes Corwin Levins, MD  Multiple Vitamins-Minerals (ZINC PO) Take by mouth.   Yes  [provider]  omeprazole (PRILOSEC) 40 MG capsule Take 1 capsule (40 mg total) by mouth daily. 11/17/18  Yes Corwin Levins, MD  VITAMIN D PO Take by mouth.   Yes [provider]  benzonatate (TESSALON) 100 MG capsule Take 1 capsule (100 mg total) by mouth every 8 (eight) hours. 02/20/19   Hall-Potvin, [place], PA-C  fluticasone (FLONASE) 50 MCG/ACT nasal spray Place 1 spray into both nostrils daily. 02/20/19   Hall-Potvin, [place], PA-C  predniSONE (DELTASONE) 20 MG tablet Take 2 tablets (40 mg total) by mouth daily for 7 days. 02/20/19 02/27/19  Hall-Potvin, [place], PA-C    Family History Family History  Problem Relation Age of Onset  . Hypertension Other   . Colon cancer Neg Hx     Social History Social History   Tobacco Use  . Smoking status: Never Smoker  . Smokeless tobacco: Never Used  Substance Use Topics  . Alcohol use: No  . Drug use: No     Allergies   Topamax [topiramate] and Topical sulfur   Review of Systems Review of Systems  Constitutional: Negative for fatigue and fever.  Respiratory: Positive for cough and wheezing. Negative for shortness of breath.   Cardiovascular: Negative for chest pain and palpitations.  Gastrointestinal: Negative for abdominal pain, diarrhea and vomiting.  Musculoskeletal: Negative for arthralgias and myalgias.  Skin: Negative for rash and wound.  Neurological: Positive for headaches. Negative for dizziness, facial asymmetry, speech difficulty and light-headedness.  All other systems reviewed and are negative.    Physical Exam Triage Vital Signs ED Triage Vitals  Enc Vitals Group     BP 02/20/19 1133 133/81     Pulse Rate 02/20/19 1133 77     Resp --      Temp 02/20/19 1133 98.4 F (36.9 C)     Temp Source 02/20/19 1133 Oral     SpO2 02/20/19 1133 96 %     Weight 02/20/19 1136 194 lb (88 kg)     Height 02/20/19 1136 5\' 7"  (1.702 m)     Head Circumference --      Peak Flow --      Pain Score  02/20/19 1136 0     Pain Loc --      Pain Edu? --      Excl. in GC? --    No data found.  Updated Vital Signs BP 133/81 (BP Location: Right Arm)   Pulse 77   Temp 98.4 F (36.9 C) (Oral)   Ht 5\' 7"  (1.702 m)   Wt 194 lb (88 kg)   SpO2 96%   BMI 30.38 kg/m   Visual Acuity Right Eye Distance:   Left Eye Distance:   Bilateral Distance:    Right Eye Near:   Left Eye Near:    Bilateral Near:     Physical Exam Constitutional:      General: He is not in acute distress. HENT:     Head: Normocephalic  and atraumatic.  Eyes:     General: No scleral icterus.    Pupils: Pupils are equal, round, and reactive to light.  Cardiovascular:     Rate and Rhythm: Normal rate and regular rhythm.  Pulmonary:     Effort: Pulmonary effort is normal. No respiratory distress.     Comments: Diffuse end phase expiratory wheezing without prolonged expiratory phase Skin:    Coloration: Skin is not jaundiced or pale.  Neurological:     Mental Status: He is alert and oriented to person, place, and time.      UC Treatments / Results  Labs (all labs ordered are listed, but only abnormal results are displayed) Labs Reviewed - No data to display  EKG   Radiology No results found.  Procedures Procedures (including critical care time)  Medications Ordered in UC Medications - No data to display  Initial Impression / Assessment and Plan / UC Course  I have reviewed the triage vital signs and the nursing notes.  Pertinent labs & imaging results that were available during my care of the patient were reviewed by me and considered in my medical decision making (see chart for details).     Patient afebrile, nontoxic.  H&P consistent with viral URI, likely with allergic component.  Will treat supportively as outlined below.  Return precautions discussed, patient verbalized understanding and is agreeable to plan. Final Clinical Impressions(s) / UC Diagnoses   Final diagnoses:  Viral URI  with cough  Allergy, initial encounter     Discharge Instructions     Take medications as prescribed. Be sure to follow-up with PCP in 1 week for persistent, worsening symptoms. Return sooner than that for worsening cough, shortness of breath, chest pain, fever.    ED Prescriptions    Medication Sig Dispense Auth. Provider   fluticasone (FLONASE) 50 MCG/ACT nasal spray Place 1 spray into both nostrils daily. 16 g Hall-Potvin, [place], PA-C   benzonatate (TESSALON) 100 MG capsule Take 1 capsule (100 mg total) by mouth every 8 (eight) hours. 21 capsule Hall-Potvin, [place], PA-C   predniSONE (DELTASONE) 20 MG tablet Take 2 tablets (40 mg total) by mouth daily for 7 days. 14 tablet Hall-Potvin, [place], PA-C     PDMP not reviewed this encounter.   Neldon Mc [place], [place] 02/22/19 1946

## 2019-05-03 DIAGNOSIS — Z79899 Other long term (current) drug therapy: Secondary | ICD-10-CM | POA: Diagnosis not present

## 2019-05-03 DIAGNOSIS — Z5181 Encounter for therapeutic drug level monitoring: Secondary | ICD-10-CM | POA: Diagnosis not present

## 2019-05-05 DIAGNOSIS — M961 Postlaminectomy syndrome, not elsewhere classified: Secondary | ICD-10-CM | POA: Diagnosis not present

## 2019-05-05 DIAGNOSIS — G894 Chronic pain syndrome: Secondary | ICD-10-CM | POA: Diagnosis not present

## 2019-05-05 DIAGNOSIS — M1711 Unilateral primary osteoarthritis, right knee: Secondary | ICD-10-CM | POA: Diagnosis not present

## 2019-05-05 DIAGNOSIS — M545 Low back pain: Secondary | ICD-10-CM | POA: Diagnosis not present

## 2019-05-21 ENCOUNTER — Ambulatory Visit (INDEPENDENT_AMBULATORY_CARE_PROVIDER_SITE_OTHER): Payer: Medicare HMO | Admitting: Internal Medicine

## 2019-05-21 DIAGNOSIS — J309 Allergic rhinitis, unspecified: Secondary | ICD-10-CM | POA: Diagnosis not present

## 2019-05-21 MED ORDER — PREDNISONE 10 MG PO TABS: ORAL_TABLET | ORAL | 0 refills | Status: DC

## 2019-05-21 MED ORDER — TRIAMCINOLONE ACETONIDE 55 MCG/ACT NA AERO: 2.0000 | INHALATION_SPRAY | Freq: Every day | NASAL | 3 refills | Status: DC

## 2019-05-21 MED ORDER — AZITHROMYCIN 250 MG PO TABS: ORAL_TABLET | ORAL | 1 refills | Status: DC

## 2019-05-21 MED ORDER — FEXOFENADINE HCL 180 MG PO TABS: 180.0000 mg | ORAL_TABLET | Freq: Every day | ORAL | 3 refills | Status: AC

## 2019-05-21 NOTE — Progress Notes (Signed)
Patient ID: [name], male   DOB: 1956/01/27, 64 y.o.   MRN: [id number]  Phone visit  Cumulative time during 7-day interval 12 min, there was not an associated office visit for this concern within a 7 day period.  Verbal consent for services obtained from patient prior to services given.  Names of all persons present for services: Oliver Barre, MD, patient  Chief complaint: sinus symptoms  History, background, results pertinent:   Here with 2-3 days acute onset fever, facial pain, pressure, headache, general weakness and malaise, and greenish d/c, with mild ST and cough, but pt denies chest pain, wheezing, increased sob or doe, orthopnea, PND, increased LE swelling, palpitations, dizziness or syncope.  Does have several wks ongoing nasal allergy symptoms with clearish congestion, itch and sneezing, without fever, pain, ST, cough, swelling or wheezing. Past Medical History:  Diagnosis Date  . ANEMIA, PROTEIN-DEFICIENCY 09/29/2006  . ANXIETY 09/29/2006  . BACK PAIN 05/12/2007  . BRONCHITIS, ACUTE 02/27/2009  . DEPRESSION 09/29/2006  . DISC DISEASE, CERVICAL 02/01/2007  . FIBROMYALGIA 02/01/2007  . GERD 09/29/2006  . Hypertension   . HYPERTENSION 09/29/2006  . Impaired glucose tolerance 04/02/2011  . INSOMNIA-SLEEP DISORDER-UNSPEC 03/07/2009  . NECK PAIN, LEFT 03/07/2009  . Osteoporosis   . PAIN, CHRONIC NEC 09/29/2006  . PEPTIC ULCER DISEASE 02/01/2007  . Personal history of venous thrombosis and embolism 02/01/2007  . PROTEIN C DEFICIENCY 09/29/2006  . RASH-NONVESICULAR 05/12/2007  . ROTATOR CUFF SYNDROME, RIGHT 02/01/2007  . SEIZURE DISORDER 09/29/2006  . Seizures (HCC)   . SLEEP APNEA, OBSTRUCTIVE, MODERATE 09/29/2006   was told CPAP would not help after he had an accident 2006  . SPONDYLOSIS, LUMBAR 02/01/2007   No results found for this or any previous visit (from the past 48 hour(s)).   A/P/next steps:   Sinusitis - Mild to mod, for antibx course,  to f/u any worsening symptoms or  concerns  Allergy - for predpac asd, allegra, nasacort asd,  to f/u any worsening symptoms or concerns  Oliver Barre MD

## 2019-05-23 ENCOUNTER — Encounter: Payer: Self-pay | Admitting: Internal Medicine

## 2019-05-23 NOTE — Assessment & Plan Note (Signed)
See notes

## 2019-05-23 NOTE — Patient Instructions (Signed)
See notes

## 2019-05-27 ENCOUNTER — Telehealth: Payer: Self-pay

## 2019-05-27 NOTE — Telephone Encounter (Signed)
New message    The wife will be faxing over the handicap form from [facility] wanted Dr. Raphael Gibney nurse to be aware.

## 2019-05-28 NOTE — Telephone Encounter (Signed)
Patient is calling to see if we received fax. I have not. Do you have this form?

## 2019-05-31 NOTE — Telephone Encounter (Signed)
Form given to [place] to charge.

## 2019-05-31 NOTE — Telephone Encounter (Signed)
I have the form.

## 2019-06-02 NOTE — Telephone Encounter (Signed)
F/u   The patient calling checking to on the status of the form.

## 2019-06-03 NOTE — Telephone Encounter (Signed)
Form was in scan, But it has been faxed again, &Original mailed to patient.   Patient has been informed.

## 2019-07-16 DIAGNOSIS — G894 Chronic pain syndrome: Secondary | ICD-10-CM | POA: Diagnosis not present

## 2019-07-16 DIAGNOSIS — M545 Low back pain: Secondary | ICD-10-CM | POA: Diagnosis not present

## 2019-07-16 DIAGNOSIS — M961 Postlaminectomy syndrome, not elsewhere classified: Secondary | ICD-10-CM | POA: Diagnosis not present

## 2019-07-16 DIAGNOSIS — M1711 Unilateral primary osteoarthritis, right knee: Secondary | ICD-10-CM | POA: Diagnosis not present

## 2019-07-19 ENCOUNTER — Ambulatory Visit (INDEPENDENT_AMBULATORY_CARE_PROVIDER_SITE_OTHER): Payer: Medicare HMO | Admitting: Internal Medicine

## 2019-07-19 ENCOUNTER — Ambulatory Visit (INDEPENDENT_AMBULATORY_CARE_PROVIDER_SITE_OTHER): Payer: Medicare HMO

## 2019-07-19 ENCOUNTER — Other Ambulatory Visit: Payer: Self-pay

## 2019-07-19 ENCOUNTER — Encounter: Payer: Self-pay | Admitting: Internal Medicine

## 2019-07-19 VITALS — BP 140/90 | HR 91 | Temp 97.9°F | Ht 67.0 in | Wt 212.0 lb

## 2019-07-19 DIAGNOSIS — Z0001 Encounter for general adult medical examination with abnormal findings: Secondary | ICD-10-CM | POA: Diagnosis not present

## 2019-07-19 DIAGNOSIS — Z125 Encounter for screening for malignant neoplasm of prostate: Secondary | ICD-10-CM | POA: Diagnosis not present

## 2019-07-19 DIAGNOSIS — J309 Allergic rhinitis, unspecified: Secondary | ICD-10-CM | POA: Diagnosis not present

## 2019-07-19 DIAGNOSIS — R7302 Impaired glucose tolerance (oral): Secondary | ICD-10-CM

## 2019-07-19 DIAGNOSIS — J4531 Mild persistent asthma with (acute) exacerbation: Secondary | ICD-10-CM

## 2019-07-19 DIAGNOSIS — J329 Chronic sinusitis, unspecified: Secondary | ICD-10-CM | POA: Diagnosis not present

## 2019-07-19 DIAGNOSIS — E611 Iron deficiency: Secondary | ICD-10-CM

## 2019-07-19 DIAGNOSIS — E559 Vitamin D deficiency, unspecified: Secondary | ICD-10-CM

## 2019-07-19 DIAGNOSIS — E538 Deficiency of other specified B group vitamins: Secondary | ICD-10-CM

## 2019-07-19 DIAGNOSIS — R05 Cough: Secondary | ICD-10-CM | POA: Diagnosis not present

## 2019-07-19 DIAGNOSIS — Z0000 Encounter for general adult medical examination without abnormal findings: Secondary | ICD-10-CM

## 2019-07-19 MED ORDER — PREDNISONE 10 MG PO TABS: ORAL_TABLET | ORAL | 0 refills | Status: DC

## 2019-07-19 MED ORDER — DOXYCYCLINE HYCLATE 100 MG PO TABS: 100.0000 mg | ORAL_TABLET | Freq: Two times a day (BID) | ORAL | 0 refills | Status: DC

## 2019-07-19 MED ORDER — ALBUTEROL SULFATE HFA 108 (90 BASE) MCG/ACT IN AERS: 2.0000 | INHALATION_SPRAY | Freq: Four times a day (QID) | RESPIRATORY_TRACT | 5 refills | Status: DC | PRN

## 2019-07-19 MED ORDER — METHYLPREDNISOLONE ACETATE 80 MG/ML IJ SUSP
80.0000 mg | Freq: Once | INTRAMUSCULAR | Status: AC
  Administered 2019-07-19: 80 mg via INTRAMUSCULAR

## 2019-07-19 MED ORDER — HYDROCODONE-HOMATROPINE 5-1.5 MG/5ML PO SYRP: 5.0000 mL | ORAL_SOLUTION | Freq: Four times a day (QID) | ORAL | 0 refills | Status: AC | PRN

## 2019-07-19 NOTE — Patient Instructions (Signed)
Please take all new medication as prescribed - the antibiotic, cough medicine, prednisone and inhaler as needed  You had the steroid shot today  Please continue all other medications as before, and refills have been done if requested.  Please have the pharmacy call with any other refills you may need.  Please continue your efforts at being more active, low cholesterol diet, and weight control.  You are otherwise up to date with prevention measures today.  Please keep your appointments with your specialists as you may have planned  Please go to the XRAY Department in the first floor for the x-ray testing  Please go to the LAB at the blood drawing area for the tests to be done  You will be contacted by phone if any changes need to be made immediately.  Otherwise, you will receive a letter about your results with an explanation, but please check with MyChart first.  Please remember to sign up for MyChart if you have not done so, as this will be important to you in the future with finding out test results, communicating by private email, and scheduling acute appointments online when needed.  Please make an Appointment to return in 6 months, or sooner if needed

## 2019-07-19 NOTE — Assessment & Plan Note (Signed)
stable overall by history and exam, recent data reviewed with pt, and pt to continue medical treatment as before,  to f/u any worsening symptoms or concerns  

## 2019-07-19 NOTE — Assessment & Plan Note (Signed)

## 2019-07-19 NOTE — Assessment & Plan Note (Signed)
Mild to mod, for predpac asd, albuterol hfa prn,,  to f/u any worsening symptoms or concerns 
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
## 2019-07-19 NOTE — Assessment & Plan Note (Signed)
/  Mild to mod, for predpac asd,  to f/u any worsening symptoms or concerns 

## 2019-07-19 NOTE — Assessment & Plan Note (Addendum)
Mild to mod, for antibx course,  to f/u any worsening symptoms or concerns  I spent 35 minutes in addition to time for CPX wellness examination in preparing to see the patient by review of recent labs, imaging and procedures, obtaining and reviewing separately obtained history, communicating with the patient and family or caregiver, ordering medications, tests or procedures, and documenting clinical information in the EHR including the differential Dx, treatment, and any further evaluation and other management of sinusitis, allergies, asthma, hyperglycemia

## 2019-07-20 LAB — IBC PANEL
Iron: 62 ug/dL (ref 42–165)
Saturation Ratios: 14.1 % — ABNORMAL LOW (ref 20.0–50.0)
Transferrin: 315 mg/dL (ref 212.0–360.0)

## 2019-07-20 LAB — URINALYSIS, ROUTINE W REFLEX MICROSCOPIC
Bilirubin Urine: NEGATIVE
Hgb urine dipstick: NEGATIVE
Ketones, ur: NEGATIVE
Leukocytes,Ua: NEGATIVE
Nitrite: NEGATIVE
RBC / HPF: NONE SEEN (ref 0–?)
Specific Gravity, Urine: >=1.03 — AB (ref 1.000–1.030)
Total Protein, Urine: NEGATIVE
Urine Glucose: NEGATIVE
Urobilinogen, UA: 0.2 (ref 0.0–1.0)
pH: 5.5 (ref 5.0–8.0)

## 2019-07-20 LAB — CBC WITH DIFFERENTIAL/PLATELET
Basophils Absolute: 0.1 10*3/uL (ref 0.0–0.1)
Basophils Relative: 0.8 % (ref 0.0–3.0)
Eosinophils Absolute: 0.8 10*3/uL — ABNORMAL HIGH (ref 0.0–0.7)
Eosinophils Relative: 9.6 % — ABNORMAL HIGH (ref 0.0–5.0)
HCT: 44.8 % (ref 39.0–52.0)
Hemoglobin: 15.2 g/dL (ref 13.0–17.0)
Lymphocytes Relative: 32 % (ref 12.0–46.0)
Lymphs Abs: 2.7 10*3/uL (ref 0.7–4.0)
MCHC: 34 g/dL (ref 30.0–36.0)
MCV: 85.4 fl (ref 78.0–100.0)
Monocytes Absolute: 0.7 10*3/uL (ref 0.1–1.0)
Monocytes Relative: 8.4 % (ref 3.0–12.0)
Neutro Abs: 4.2 10*3/uL (ref 1.4–7.7)
Neutrophils Relative %: 49.2 % (ref 43.0–77.0)
Platelets: 216 10*3/uL (ref 150.0–400.0)
RBC: 5.24 Mil/uL (ref 4.22–5.81)
RDW: 13.9 % (ref 11.5–15.5)
WBC: 8.5 10*3/uL (ref 4.0–10.5)

## 2019-07-20 LAB — BASIC METABOLIC PANEL
BUN: 18 mg/dL (ref 6–23)
CO2: 32 mEq/L (ref 19–32)
Calcium: 9.6 mg/dL (ref 8.4–10.5)
Chloride: 102 mEq/L (ref 96–112)
Creatinine, Ser: 1.38 mg/dL (ref 0.40–1.50)
GFR: 52.14 mL/min — ABNORMAL LOW (ref >60.00)
Glucose, Bld: 70 mg/dL (ref 70–99)
Potassium: 4.1 mEq/L (ref 3.5–5.1)
Sodium: 141 mEq/L (ref 135–145)

## 2019-07-20 LAB — HEMOGLOBIN A1C: Hgb A1c MFr Bld: 5.9 % (ref 4.6–6.5)

## 2019-07-20 LAB — HEPATIC FUNCTION PANEL
ALT: 28 U/L (ref 0–53)
AST: 19 U/L (ref 0–37)
Albumin: 4.5 g/dL (ref 3.5–5.2)
Alkaline Phosphatase: 92 U/L (ref 39–117)
Bilirubin, Direct: 0.1 mg/dL (ref 0.0–0.3)
Total Bilirubin: 0.8 mg/dL (ref 0.2–1.2)
Total Protein: 7.5 g/dL (ref 6.0–8.3)

## 2019-07-20 LAB — LDL CHOLESTEROL, DIRECT: Direct LDL: 94 mg/dL

## 2019-07-20 LAB — VITAMIN B12: Vitamin B-12: 430 pg/mL (ref 211–911)

## 2019-07-20 LAB — PSA: PSA: 1.39 ng/mL (ref 0.10–4.00)

## 2019-07-20 LAB — LIPID PANEL
Cholesterol: 159 mg/dL (ref 0–200)
HDL: 35.5 mg/dL — ABNORMAL LOW (ref >39.00)
NonHDL: 123.24
Total CHOL/HDL Ratio: 4
Triglycerides: 232 mg/dL — ABNORMAL HIGH (ref 0.0–149.0)
VLDL: 46.4 mg/dL — ABNORMAL HIGH (ref 0.0–40.0)

## 2019-07-20 LAB — VITAMIN D 25 HYDROXY (VIT D DEFICIENCY, FRACTURES): VITD: 30.87 ng/mL (ref 30.00–100.00)

## 2019-07-20 LAB — TSH: TSH: 1.49 u[IU]/mL (ref 0.35–4.50)

## 2019-09-09 DIAGNOSIS — M961 Postlaminectomy syndrome, not elsewhere classified: Secondary | ICD-10-CM | POA: Diagnosis not present

## 2019-09-09 DIAGNOSIS — M1711 Unilateral primary osteoarthritis, right knee: Secondary | ICD-10-CM | POA: Diagnosis not present

## 2019-09-09 DIAGNOSIS — M545 Low back pain: Secondary | ICD-10-CM | POA: Diagnosis not present

## 2019-09-09 DIAGNOSIS — G894 Chronic pain syndrome: Secondary | ICD-10-CM | POA: Diagnosis not present

## 2019-09-23 ENCOUNTER — Telehealth: Payer: Self-pay | Admitting: Internal Medicine

## 2019-09-23 NOTE — Telephone Encounter (Signed)
New Message:   Pt is calling and states he has another sinus infection and he states he would like to know if Dr. Jonny Ruiz will send him in an antibiotic he sent for him the last time. Please advise.   [id number] - [place], [place] - [address]

## 2019-09-24 NOTE — Telephone Encounter (Signed)
Sent to Dr. John to advise. 

## 2019-09-25 ENCOUNTER — Other Ambulatory Visit: Payer: Self-pay

## 2019-09-25 ENCOUNTER — Encounter: Payer: Self-pay | Admitting: Internal Medicine

## 2019-09-25 ENCOUNTER — Telehealth (INDEPENDENT_AMBULATORY_CARE_PROVIDER_SITE_OTHER): Payer: Medicare HMO | Admitting: Internal Medicine

## 2019-09-25 DIAGNOSIS — J0141 Acute recurrent pansinusitis: Secondary | ICD-10-CM

## 2019-09-25 MED ORDER — BENZONATATE 200 MG PO CAPS
200.0000 mg | ORAL_CAPSULE | Freq: Three times a day (TID) | ORAL | 0 refills | Status: AC | PRN
Start: 2019-09-25 — End: ?

## 2019-09-25 MED ORDER — DOXYCYCLINE HYCLATE 100 MG PO TABS
100.0000 mg | ORAL_TABLET | Freq: Two times a day (BID) | ORAL | 0 refills | Status: DC
Start: 2019-09-25 — End: 2019-11-25

## 2019-09-25 MED ORDER — FLUTICASONE PROPIONATE 50 MCG/ACT NA SUSP
2.0000 | Freq: Every day | NASAL | 1 refills | Status: AC
Start: 2019-09-25 — End: ?

## 2019-09-25 MED ORDER — PREDNISONE 20 MG PO TABS
40.0000 mg | ORAL_TABLET | Freq: Every day | ORAL | 0 refills | Status: DC
Start: 2019-09-25 — End: 2019-10-15
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
## 2019-10-12 ENCOUNTER — Telehealth: Payer: Self-pay | Admitting: Internal Medicine

## 2019-10-12 NOTE — Telephone Encounter (Signed)
[name]- Patient asks that you call him when you have time today

## 2019-10-13 MED ORDER — AZITHROMYCIN 250 MG PO TABS
ORAL_TABLET | ORAL | 0 refills | Status: DC
Start: 2019-10-13 — End: 2019-11-25

## 2019-10-13 NOTE — Addendum Note (Signed)
Addended by: Corwin Levins on: 10/13/2019 01:11 PM   Modules accepted: Orders

## 2019-10-13 NOTE — Telephone Encounter (Signed)
New message:   Pt is calling and states he is needing more antibiotics for the sinus infection he has. Please advise.

## 2019-10-13 NOTE — Telephone Encounter (Addendum)
Ok this time only - to [facility]  Pt would need rov further evaluation  Ok to let us know if needs to see ENT if this keeps recurring

## 2019-10-15 ENCOUNTER — Emergency Department
Admission: EM | Admit: 2019-10-15 | Discharge: 2019-10-15 | Disposition: A | Discharge status: Home / Self Care | Payer: Medicare HMO | Source: Home / Self Care

## 2019-10-15 ENCOUNTER — Other Ambulatory Visit: Payer: Self-pay

## 2019-10-15 DIAGNOSIS — J0101 Acute recurrent maxillary sinusitis: Secondary | ICD-10-CM

## 2019-10-15 DIAGNOSIS — J069 Acute upper respiratory infection, unspecified: Secondary | ICD-10-CM

## 2019-10-15 MED ORDER — PREDNISONE 50 MG PO TABS: 50.0000 mg | ORAL_TABLET | Freq: Every day | ORAL | 0 refills | Status: AC

## 2019-10-15 MED ORDER — METHYLPREDNISOLONE SODIUM SUCC 40 MG IJ SOLR
80.0000 mg | Freq: Once | INTRAMUSCULAR | Status: AC
  Administered 2019-10-15: 80 mg via INTRAMUSCULAR

## 2019-10-15 MED ORDER — CEFDINIR 300 MG PO CAPS
300.0000 mg | ORAL_CAPSULE | Freq: Two times a day (BID) | ORAL | 0 refills | Status: AC
Start: 2019-10-15 — End: 2019-10-25

## 2019-10-15 NOTE — ED Triage Notes (Signed)
Patient presents to Urgent Care with complaints of sinus "issues" chronically. Patient reports he tried to get in with his PCP and could not, so he came here for antibiotics, prednisone, and a steroid shot.

## 2019-10-15 NOTE — Discharge Instructions (Signed)
  Please take antibiotics as prescribed and be sure to complete entire course even if you start to feel better to ensure infection does not come back.  Call to schedule a follow up appointment with your primary care provider for recheck of symptoms and referral to ENT.
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
## 2019-10-16 LAB — SARS-COV-2 RNA,(COVID-19) QUALITATIVE NAAT: SARS CoV2 RNA: NOT DETECTED

## 2019-10-17 ENCOUNTER — Other Ambulatory Visit: Payer: Self-pay | Admitting: Internal Medicine

## 2019-11-09 DIAGNOSIS — M961 Postlaminectomy syndrome, not elsewhere classified: Secondary | ICD-10-CM | POA: Diagnosis not present

## 2019-11-09 DIAGNOSIS — Z5181 Encounter for therapeutic drug level monitoring: Secondary | ICD-10-CM | POA: Diagnosis not present

## 2019-11-09 DIAGNOSIS — Z79899 Other long term (current) drug therapy: Secondary | ICD-10-CM | POA: Diagnosis not present

## 2019-11-09 DIAGNOSIS — M545 Low back pain: Secondary | ICD-10-CM | POA: Diagnosis not present

## 2019-11-09 DIAGNOSIS — G894 Chronic pain syndrome: Secondary | ICD-10-CM | POA: Diagnosis not present

## 2019-11-09 DIAGNOSIS — M1711 Unilateral primary osteoarthritis, right knee: Secondary | ICD-10-CM | POA: Diagnosis not present

## 2019-11-19 ENCOUNTER — Other Ambulatory Visit: Payer: Self-pay | Admitting: Internal Medicine

## 2019-11-19 NOTE — Telephone Encounter (Signed)
Please refill as per office routine med refill policy (all routine meds refilled for 3 mo or monthly per pt preference up to one year from last visit, then month to month grace period for 3 mo, then further med refills will have to be denied)  

## 2019-11-25 ENCOUNTER — Encounter: Payer: Self-pay | Admitting: Internal Medicine

## 2019-11-25 ENCOUNTER — Ambulatory Visit (INDEPENDENT_AMBULATORY_CARE_PROVIDER_SITE_OTHER): Payer: Medicare HMO | Admitting: Internal Medicine

## 2019-11-25 ENCOUNTER — Other Ambulatory Visit: Payer: Self-pay

## 2019-11-25 VITALS — BP 140/80 | HR 90 | Temp 98.2°F | Ht 67.0 in | Wt 213.0 lb

## 2019-11-25 DIAGNOSIS — J309 Allergic rhinitis, unspecified: Secondary | ICD-10-CM

## 2019-11-25 DIAGNOSIS — Z23 Encounter for immunization: Secondary | ICD-10-CM

## 2019-11-25 DIAGNOSIS — J329 Chronic sinusitis, unspecified: Secondary | ICD-10-CM

## 2019-11-25 DIAGNOSIS — R7302 Impaired glucose tolerance (oral): Secondary | ICD-10-CM

## 2019-11-25 DIAGNOSIS — I10 Essential (primary) hypertension: Secondary | ICD-10-CM | POA: Diagnosis not present

## 2019-11-25 MED ORDER — METHYLPREDNISOLONE ACETATE 80 MG/ML IJ SUSP
80.0000 mg | Freq: Once | INTRAMUSCULAR | Status: AC
  Administered 2019-11-25: 80 mg via INTRAMUSCULAR

## 2019-11-25 MED ORDER — LEVOFLOXACIN 500 MG PO TABS: 500.0000 mg | ORAL_TABLET | Freq: Every day | ORAL | 0 refills | Status: AC

## 2019-11-25 MED ORDER — ATORVASTATIN CALCIUM 10 MG PO TABS: 10.0000 mg | ORAL_TABLET | Freq: Every day | ORAL | 3 refills | Status: AC

## 2019-11-25 MED ORDER — LEVOFLOXACIN 500 MG PO TABS
500.0000 mg | ORAL_TABLET | Freq: Every day | ORAL | 0 refills | Status: DC
Start: 2019-11-25 — End: 2019-11-25

## 2019-11-25 MED ORDER — OMEPRAZOLE 40 MG PO CPDR: 40.0000 mg | DELAYED_RELEASE_CAPSULE | Freq: Every day | ORAL | 3 refills | Status: AC

## 2019-11-25 MED ORDER — PREDNISONE 10 MG PO TABS
ORAL_TABLET | ORAL | 0 refills | Status: DC
Start: 2019-11-25 — End: 2019-11-25

## 2019-11-25 MED ORDER — LAMOTRIGINE 200 MG PO TABS: 200.0000 mg | ORAL_TABLET | Freq: Every day | ORAL | 3 refills | Status: AC

## 2019-11-25 MED ORDER — AMLODIPINE BESYLATE 5 MG PO TABS: 5.0000 mg | ORAL_TABLET | Freq: Every day | ORAL | 3 refills | Status: AC

## 2019-11-25 MED ORDER — PREDNISONE 10 MG PO TABS: ORAL_TABLET | ORAL | 0 refills | Status: AC

## 2019-11-25 MED ORDER — ALBUTEROL SULFATE HFA 108 (90 BASE) MCG/ACT IN AERS: 2.0000 | INHALATION_SPRAY | Freq: Four times a day (QID) | RESPIRATORY_TRACT | 3 refills | Status: AC | PRN

## 2019-11-25 NOTE — Patient Instructions (Signed)
You had the flu shot today  You had the steroid shot today  Please take all new medication as prescribed - the antibiotic, and prednisone (sent to [facility])  Please continue all other medications as before, and refills have been done if requested.  Please have the pharmacy call with any other refills you may need.  Please continue your efforts at being more active, low cholesterol diet, and weight control.  Please keep your appointments with your specialists as you may have planned  You will be contacted regarding the referral for: ENT urgent

## 2019-11-25 NOTE — Assessment & Plan Note (Signed)
stable overall by history and exam, recent data reviewed with pt, and pt to continue medical treatment as before,  to f/u any worsening symptoms or concerns  

## 2019-11-25 NOTE — Assessment & Plan Note (Addendum)
Mild to mod, for antibx course,  to f/u any worsening symptoms or concerns, and refer enT r/o obstruction  I spent 31 minutes in addition to time for CPX wellness examination in preparing to see the patient by review of recent labs, imaging and procedures, obtaining and reviewing separately obtained history, communicating with the patient and family or caregiver, ordering medications, tests or procedures, and documenting clinical information in the EHR including the differential Dx, treatment, and any further evaluation and other management of recurrent sinus infections, allergies, htn, hyperglycemia
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
## 2019-11-25 NOTE — Assessment & Plan Note (Signed)
Mild to mod, for depomedrol im 80, predpac asd,  to f/u any worsening symptoms or concerns 

## 2019-12-07 DIAGNOSIS — J342 Deviated nasal septum: Secondary | ICD-10-CM | POA: Diagnosis not present

## 2019-12-07 DIAGNOSIS — J0191 Acute recurrent sinusitis, unspecified: Secondary | ICD-10-CM | POA: Diagnosis not present

## 2019-12-28 DIAGNOSIS — J342 Deviated nasal septum: Secondary | ICD-10-CM | POA: Diagnosis not present

## 2019-12-28 DIAGNOSIS — J323 Chronic sphenoidal sinusitis: Secondary | ICD-10-CM | POA: Diagnosis not present

## 2019-12-28 DIAGNOSIS — J3489 Other specified disorders of nose and nasal sinuses: Secondary | ICD-10-CM | POA: Diagnosis not present

## 2019-12-28 DIAGNOSIS — J322 Chronic ethmoidal sinusitis: Secondary | ICD-10-CM | POA: Diagnosis not present

## 2019-12-28 DIAGNOSIS — J0191 Acute recurrent sinusitis, unspecified: Secondary | ICD-10-CM | POA: Diagnosis not present

## 2019-12-28 DIAGNOSIS — J0130 Acute sphenoidal sinusitis, unspecified: Secondary | ICD-10-CM | POA: Diagnosis not present

## 2019-12-30 DIAGNOSIS — R918 Other nonspecific abnormal finding of lung field: Secondary | ICD-10-CM | POA: Diagnosis not present

## 2019-12-30 DIAGNOSIS — R112 Nausea with vomiting, unspecified: Secondary | ICD-10-CM | POA: Diagnosis not present

## 2019-12-30 DIAGNOSIS — Z7189 Other specified counseling: Secondary | ICD-10-CM | POA: Diagnosis not present

## 2019-12-30 DIAGNOSIS — I517 Cardiomegaly: Secondary | ICD-10-CM | POA: Diagnosis not present

## 2019-12-30 DIAGNOSIS — U071 COVID-19: Secondary | ICD-10-CM | POA: Diagnosis not present

## 2019-12-30 DIAGNOSIS — R55 Syncope and collapse: Secondary | ICD-10-CM | POA: Diagnosis not present

## 2019-12-30 DIAGNOSIS — R0602 Shortness of breath: Secondary | ICD-10-CM | POA: Diagnosis not present

## 2019-12-30 DIAGNOSIS — A4189 Other specified sepsis: Secondary | ICD-10-CM | POA: Diagnosis not present

## 2019-12-30 DIAGNOSIS — J189 Pneumonia, unspecified organism: Secondary | ICD-10-CM | POA: Diagnosis not present

## 2019-12-30 DIAGNOSIS — Z4682 Encounter for fitting and adjustment of non-vascular catheter: Secondary | ICD-10-CM | POA: Diagnosis not present

## 2019-12-30 DIAGNOSIS — J151 Pneumonia due to Pseudomonas: Secondary | ICD-10-CM | POA: Diagnosis not present

## 2019-12-30 DIAGNOSIS — R0902 Hypoxemia: Secondary | ICD-10-CM | POA: Diagnosis not present

## 2019-12-30 DIAGNOSIS — E785 Hyperlipidemia, unspecified: Secondary | ICD-10-CM | POA: Diagnosis not present

## 2019-12-30 DIAGNOSIS — I468 Cardiac arrest due to other underlying condition: Secondary | ICD-10-CM | POA: Diagnosis not present

## 2019-12-30 DIAGNOSIS — D6869 Other thrombophilia: Secondary | ICD-10-CM | POA: Diagnosis not present

## 2019-12-30 DIAGNOSIS — G40909 Epilepsy, unspecified, not intractable, without status epilepticus: Secondary | ICD-10-CM | POA: Diagnosis not present

## 2019-12-30 DIAGNOSIS — N179 Acute kidney failure, unspecified: Secondary | ICD-10-CM | POA: Diagnosis not present

## 2019-12-30 DIAGNOSIS — Z515 Encounter for palliative care: Secondary | ICD-10-CM | POA: Diagnosis not present

## 2019-12-30 DIAGNOSIS — R0600 Dyspnea, unspecified: Secondary | ICD-10-CM | POA: Diagnosis not present

## 2019-12-30 DIAGNOSIS — A419 Sepsis, unspecified organism: Secondary | ICD-10-CM | POA: Diagnosis not present

## 2019-12-30 DIAGNOSIS — J9601 Acute respiratory failure with hypoxia: Secondary | ICD-10-CM | POA: Diagnosis not present

## 2019-12-30 DIAGNOSIS — R404 Transient alteration of awareness: Secondary | ICD-10-CM | POA: Diagnosis not present

## 2019-12-30 DIAGNOSIS — Z20822 Contact with and (suspected) exposure to covid-19: Secondary | ICD-10-CM | POA: Diagnosis not present

## 2019-12-30 DIAGNOSIS — J1282 Pneumonia due to coronavirus disease 2019: Secondary | ICD-10-CM | POA: Diagnosis not present

## 2019-12-30 DIAGNOSIS — R4689 Other symptoms and signs involving appearance and behavior: Secondary | ICD-10-CM | POA: Diagnosis not present

## 2019-12-30 DIAGNOSIS — Z9911 Dependence on respirator [ventilator] status: Secondary | ICD-10-CM | POA: Diagnosis not present

## 2019-12-30 DIAGNOSIS — R4182 Altered mental status, unspecified: Secondary | ICD-10-CM | POA: Diagnosis not present

## 2019-12-30 DIAGNOSIS — R6521 Severe sepsis with septic shock: Secondary | ICD-10-CM | POA: Diagnosis not present

## 2019-12-30 DIAGNOSIS — I10 Essential (primary) hypertension: Secondary | ICD-10-CM | POA: Diagnosis not present

## 2019-12-30 DIAGNOSIS — I469 Cardiac arrest, cause unspecified: Secondary | ICD-10-CM | POA: Diagnosis not present

## 2019-12-30 DIAGNOSIS — R069 Unspecified abnormalities of breathing: Secondary | ICD-10-CM | POA: Diagnosis not present

## 2019-12-30 DIAGNOSIS — J0181 Other acute recurrent sinusitis: Secondary | ICD-10-CM | POA: Diagnosis not present

## 2019-12-30 DIAGNOSIS — J90 Pleural effusion, not elsewhere classified: Secondary | ICD-10-CM | POA: Diagnosis not present

## 2019-12-30 DIAGNOSIS — G4733 Obstructive sleep apnea (adult) (pediatric): Secondary | ICD-10-CM | POA: Diagnosis not present

## 2019-12-31 DIAGNOSIS — E785 Hyperlipidemia, unspecified: Secondary | ICD-10-CM | POA: Diagnosis not present

## 2019-12-31 DIAGNOSIS — J9601 Acute respiratory failure with hypoxia: Secondary | ICD-10-CM | POA: Diagnosis not present

## 2019-12-31 DIAGNOSIS — U071 COVID-19: Secondary | ICD-10-CM | POA: Diagnosis not present

## 2019-12-31 DIAGNOSIS — I10 Essential (primary) hypertension: Secondary | ICD-10-CM | POA: Diagnosis not present

## 2019-12-31 DIAGNOSIS — J0181 Other acute recurrent sinusitis: Secondary | ICD-10-CM | POA: Diagnosis not present

## 2019-12-31 DIAGNOSIS — J1282 Pneumonia due to coronavirus disease 2019: Secondary | ICD-10-CM | POA: Diagnosis not present

## 2020-01-01 DIAGNOSIS — J1282 Pneumonia due to coronavirus disease 2019: Secondary | ICD-10-CM | POA: Diagnosis not present

## 2020-01-01 DIAGNOSIS — U071 COVID-19: Secondary | ICD-10-CM | POA: Diagnosis not present

## 2020-01-01 DIAGNOSIS — J9601 Acute respiratory failure with hypoxia: Secondary | ICD-10-CM | POA: Diagnosis not present

## 2020-01-01 DIAGNOSIS — E785 Hyperlipidemia, unspecified: Secondary | ICD-10-CM | POA: Diagnosis not present

## 2020-01-01 DIAGNOSIS — J0181 Other acute recurrent sinusitis: Secondary | ICD-10-CM | POA: Diagnosis not present

## 2020-01-01 DIAGNOSIS — I10 Essential (primary) hypertension: Secondary | ICD-10-CM | POA: Diagnosis not present

## 2020-01-02 DIAGNOSIS — I10 Essential (primary) hypertension: Secondary | ICD-10-CM | POA: Diagnosis not present

## 2020-01-02 DIAGNOSIS — E785 Hyperlipidemia, unspecified: Secondary | ICD-10-CM | POA: Diagnosis not present

## 2020-01-02 DIAGNOSIS — U071 COVID-19: Secondary | ICD-10-CM | POA: Diagnosis not present

## 2020-01-02 DIAGNOSIS — J1282 Pneumonia due to coronavirus disease 2019: Secondary | ICD-10-CM | POA: Diagnosis not present

## 2020-01-02 DIAGNOSIS — J0181 Other acute recurrent sinusitis: Secondary | ICD-10-CM | POA: Diagnosis not present

## 2020-01-02 DIAGNOSIS — J9601 Acute respiratory failure with hypoxia: Secondary | ICD-10-CM | POA: Diagnosis not present

## 2020-01-03 DIAGNOSIS — J0181 Other acute recurrent sinusitis: Secondary | ICD-10-CM | POA: Diagnosis not present

## 2020-01-03 DIAGNOSIS — J9601 Acute respiratory failure with hypoxia: Secondary | ICD-10-CM | POA: Diagnosis not present

## 2020-01-03 DIAGNOSIS — R0600 Dyspnea, unspecified: Secondary | ICD-10-CM | POA: Diagnosis not present

## 2020-01-03 DIAGNOSIS — U071 COVID-19: Secondary | ICD-10-CM | POA: Diagnosis not present

## 2020-01-03 DIAGNOSIS — G40909 Epilepsy, unspecified, not intractable, without status epilepticus: Secondary | ICD-10-CM | POA: Diagnosis not present

## 2020-01-03 DIAGNOSIS — R4689 Other symptoms and signs involving appearance and behavior: Secondary | ICD-10-CM | POA: Diagnosis not present

## 2020-01-03 DIAGNOSIS — I10 Essential (primary) hypertension: Secondary | ICD-10-CM | POA: Diagnosis not present

## 2020-01-03 DIAGNOSIS — E785 Hyperlipidemia, unspecified: Secondary | ICD-10-CM | POA: Diagnosis not present

## 2020-01-03 DIAGNOSIS — J1282 Pneumonia due to coronavirus disease 2019: Secondary | ICD-10-CM | POA: Diagnosis not present

## 2020-01-04 DIAGNOSIS — Z4682 Encounter for fitting and adjustment of non-vascular catheter: Secondary | ICD-10-CM | POA: Diagnosis not present

## 2020-01-04 DIAGNOSIS — R918 Other nonspecific abnormal finding of lung field: Secondary | ICD-10-CM | POA: Diagnosis not present

## 2020-01-04 DIAGNOSIS — J9601 Acute respiratory failure with hypoxia: Secondary | ICD-10-CM | POA: Diagnosis not present

## 2020-01-04 DIAGNOSIS — J0181 Other acute recurrent sinusitis: Secondary | ICD-10-CM | POA: Diagnosis not present

## 2020-01-04 DIAGNOSIS — N179 Acute kidney failure, unspecified: Secondary | ICD-10-CM | POA: Diagnosis not present

## 2020-01-04 DIAGNOSIS — J1282 Pneumonia due to coronavirus disease 2019: Secondary | ICD-10-CM | POA: Diagnosis not present

## 2020-01-04 DIAGNOSIS — U071 COVID-19: Secondary | ICD-10-CM | POA: Diagnosis not present

## 2020-01-05 DIAGNOSIS — U071 COVID-19: Secondary | ICD-10-CM | POA: Diagnosis not present

## 2020-01-05 DIAGNOSIS — J1282 Pneumonia due to coronavirus disease 2019: Secondary | ICD-10-CM | POA: Diagnosis not present

## 2020-01-05 DIAGNOSIS — J9601 Acute respiratory failure with hypoxia: Secondary | ICD-10-CM | POA: Diagnosis not present

## 2020-01-05 DIAGNOSIS — J0181 Other acute recurrent sinusitis: Secondary | ICD-10-CM | POA: Diagnosis not present

## 2020-01-05 DIAGNOSIS — N179 Acute kidney failure, unspecified: Secondary | ICD-10-CM | POA: Diagnosis not present

## 2020-01-06 DIAGNOSIS — R0902 Hypoxemia: Secondary | ICD-10-CM | POA: Diagnosis not present

## 2020-01-06 DIAGNOSIS — J9601 Acute respiratory failure with hypoxia: Secondary | ICD-10-CM | POA: Diagnosis not present

## 2020-01-06 DIAGNOSIS — J0181 Other acute recurrent sinusitis: Secondary | ICD-10-CM | POA: Diagnosis not present

## 2020-01-06 DIAGNOSIS — U071 COVID-19: Secondary | ICD-10-CM | POA: Diagnosis not present

## 2020-01-06 DIAGNOSIS — R918 Other nonspecific abnormal finding of lung field: Secondary | ICD-10-CM | POA: Diagnosis not present

## 2020-01-06 DIAGNOSIS — N179 Acute kidney failure, unspecified: Secondary | ICD-10-CM | POA: Diagnosis not present

## 2020-01-07 DIAGNOSIS — J0181 Other acute recurrent sinusitis: Secondary | ICD-10-CM | POA: Diagnosis not present

## 2020-01-07 DIAGNOSIS — J189 Pneumonia, unspecified organism: Secondary | ICD-10-CM | POA: Diagnosis not present

## 2020-01-07 DIAGNOSIS — N179 Acute kidney failure, unspecified: Secondary | ICD-10-CM | POA: Diagnosis not present

## 2020-01-07 DIAGNOSIS — U071 COVID-19: Secondary | ICD-10-CM | POA: Diagnosis not present

## 2020-01-07 DIAGNOSIS — J9601 Acute respiratory failure with hypoxia: Secondary | ICD-10-CM | POA: Diagnosis not present

## 2020-01-08 DIAGNOSIS — J0181 Other acute recurrent sinusitis: Secondary | ICD-10-CM | POA: Diagnosis not present

## 2020-01-08 DIAGNOSIS — J9601 Acute respiratory failure with hypoxia: Secondary | ICD-10-CM | POA: Diagnosis not present

## 2020-01-08 DIAGNOSIS — U071 COVID-19: Secondary | ICD-10-CM | POA: Diagnosis not present

## 2020-01-08 DIAGNOSIS — N179 Acute kidney failure, unspecified: Secondary | ICD-10-CM | POA: Diagnosis not present

## 2020-01-09 DIAGNOSIS — N179 Acute kidney failure, unspecified: Secondary | ICD-10-CM | POA: Diagnosis not present

## 2020-01-09 DIAGNOSIS — J9601 Acute respiratory failure with hypoxia: Secondary | ICD-10-CM | POA: Diagnosis not present

## 2020-01-09 DIAGNOSIS — J1282 Pneumonia due to coronavirus disease 2019: Secondary | ICD-10-CM | POA: Diagnosis not present

## 2020-01-09 DIAGNOSIS — U071 COVID-19: Secondary | ICD-10-CM | POA: Diagnosis not present

## 2020-01-10 DIAGNOSIS — U071 COVID-19: Secondary | ICD-10-CM | POA: Diagnosis not present

## 2020-01-10 DIAGNOSIS — A419 Sepsis, unspecified organism: Secondary | ICD-10-CM | POA: Diagnosis not present

## 2020-01-10 DIAGNOSIS — J90 Pleural effusion, not elsewhere classified: Secondary | ICD-10-CM | POA: Diagnosis not present

## 2020-01-10 DIAGNOSIS — R0602 Shortness of breath: Secondary | ICD-10-CM | POA: Diagnosis not present

## 2020-01-10 DIAGNOSIS — G4733 Obstructive sleep apnea (adult) (pediatric): Secondary | ICD-10-CM | POA: Diagnosis not present

## 2020-01-10 DIAGNOSIS — J9601 Acute respiratory failure with hypoxia: Secondary | ICD-10-CM | POA: Diagnosis not present

## 2020-01-11 DIAGNOSIS — A419 Sepsis, unspecified organism: Secondary | ICD-10-CM | POA: Diagnosis not present

## 2020-01-11 DIAGNOSIS — G4733 Obstructive sleep apnea (adult) (pediatric): Secondary | ICD-10-CM | POA: Diagnosis not present

## 2020-01-11 DIAGNOSIS — U071 COVID-19: Secondary | ICD-10-CM | POA: Diagnosis not present

## 2020-01-11 DIAGNOSIS — J9601 Acute respiratory failure with hypoxia: Secondary | ICD-10-CM | POA: Diagnosis not present

## 2020-01-12 DIAGNOSIS — J9601 Acute respiratory failure with hypoxia: Secondary | ICD-10-CM | POA: Diagnosis not present

## 2020-01-12 DIAGNOSIS — U071 COVID-19: Secondary | ICD-10-CM | POA: Diagnosis not present

## 2020-01-12 DIAGNOSIS — G4733 Obstructive sleep apnea (adult) (pediatric): Secondary | ICD-10-CM | POA: Diagnosis not present

## 2020-01-12 DIAGNOSIS — A419 Sepsis, unspecified organism: Secondary | ICD-10-CM | POA: Diagnosis not present

## 2020-01-13 DIAGNOSIS — Z515 Encounter for palliative care: Secondary | ICD-10-CM | POA: Diagnosis not present

## 2020-01-13 DIAGNOSIS — I469 Cardiac arrest, cause unspecified: Secondary | ICD-10-CM | POA: Diagnosis not present

## 2020-01-13 DIAGNOSIS — G4733 Obstructive sleep apnea (adult) (pediatric): Secondary | ICD-10-CM | POA: Diagnosis not present

## 2020-01-13 DIAGNOSIS — Z9911 Dependence on respirator [ventilator] status: Secondary | ICD-10-CM | POA: Diagnosis not present

## 2020-01-13 DIAGNOSIS — Z7189 Other specified counseling: Secondary | ICD-10-CM | POA: Diagnosis not present

## 2020-01-13 DIAGNOSIS — U071 COVID-19: Secondary | ICD-10-CM | POA: Diagnosis not present

## 2020-01-13 DIAGNOSIS — J1282 Pneumonia due to coronavirus disease 2019: Secondary | ICD-10-CM | POA: Diagnosis not present

## 2020-01-13 DIAGNOSIS — A419 Sepsis, unspecified organism: Secondary | ICD-10-CM | POA: Diagnosis not present

## 2020-01-13 DIAGNOSIS — J189 Pneumonia, unspecified organism: Secondary | ICD-10-CM | POA: Diagnosis not present

## 2020-01-13 DIAGNOSIS — J9601 Acute respiratory failure with hypoxia: Secondary | ICD-10-CM | POA: Diagnosis not present

## 2020-01-14 DIAGNOSIS — A419 Sepsis, unspecified organism: Secondary | ICD-10-CM | POA: Diagnosis not present

## 2020-01-14 DIAGNOSIS — Z7189 Other specified counseling: Secondary | ICD-10-CM | POA: Diagnosis not present

## 2020-01-14 DIAGNOSIS — G4733 Obstructive sleep apnea (adult) (pediatric): Secondary | ICD-10-CM | POA: Diagnosis not present

## 2020-01-14 DIAGNOSIS — I469 Cardiac arrest, cause unspecified: Secondary | ICD-10-CM | POA: Diagnosis not present

## 2020-01-14 DIAGNOSIS — J189 Pneumonia, unspecified organism: Secondary | ICD-10-CM | POA: Diagnosis not present

## 2020-01-14 DIAGNOSIS — Z9911 Dependence on respirator [ventilator] status: Secondary | ICD-10-CM | POA: Diagnosis not present

## 2020-01-14 DIAGNOSIS — Z515 Encounter for palliative care: Secondary | ICD-10-CM | POA: Diagnosis not present

## 2020-01-14 DIAGNOSIS — U071 COVID-19: Secondary | ICD-10-CM | POA: Diagnosis not present

## 2020-01-14 DIAGNOSIS — J1282 Pneumonia due to coronavirus disease 2019: Secondary | ICD-10-CM | POA: Diagnosis not present

## 2020-01-14 DIAGNOSIS — J9601 Acute respiratory failure with hypoxia: Secondary | ICD-10-CM | POA: Diagnosis not present

## 2020-01-15 DIAGNOSIS — J189 Pneumonia, unspecified organism: Secondary | ICD-10-CM | POA: Diagnosis not present

## 2020-01-15 DIAGNOSIS — Z7189 Other specified counseling: Secondary | ICD-10-CM | POA: Diagnosis not present

## 2020-01-15 DIAGNOSIS — G4733 Obstructive sleep apnea (adult) (pediatric): Secondary | ICD-10-CM | POA: Diagnosis not present

## 2020-01-15 DIAGNOSIS — R0600 Dyspnea, unspecified: Secondary | ICD-10-CM | POA: Diagnosis not present

## 2020-01-15 DIAGNOSIS — J9601 Acute respiratory failure with hypoxia: Secondary | ICD-10-CM | POA: Diagnosis not present

## 2020-01-15 DIAGNOSIS — A419 Sepsis, unspecified organism: Secondary | ICD-10-CM | POA: Diagnosis not present

## 2020-01-15 DIAGNOSIS — N179 Acute kidney failure, unspecified: Secondary | ICD-10-CM | POA: Diagnosis not present

## 2020-01-15 DIAGNOSIS — Z9911 Dependence on respirator [ventilator] status: Secondary | ICD-10-CM | POA: Diagnosis not present

## 2020-01-15 DIAGNOSIS — R4182 Altered mental status, unspecified: Secondary | ICD-10-CM | POA: Diagnosis not present

## 2020-01-15 DIAGNOSIS — Z515 Encounter for palliative care: Secondary | ICD-10-CM | POA: Diagnosis not present

## 2020-01-15 DIAGNOSIS — I469 Cardiac arrest, cause unspecified: Secondary | ICD-10-CM | POA: Diagnosis not present

## 2020-01-15 DIAGNOSIS — U071 COVID-19: Secondary | ICD-10-CM | POA: Diagnosis not present

## 2020-01-15 DIAGNOSIS — J1282 Pneumonia due to coronavirus disease 2019: Secondary | ICD-10-CM | POA: Diagnosis not present

## 2020-01-18 ENCOUNTER — Telehealth: Payer: Self-pay | Admitting: Internal Medicine

## 2020-01-18 NOTE — Telephone Encounter (Signed)
Patients wife calling to make [place] aware the Patient passed away on 01-18-2020 Wife # [phone]

## 2020-01-18 NOTE — Telephone Encounter (Signed)
Sent to Dr. John. 

## 2020-01-24 DEATH — deceased
